# Patient Record
Sex: Female | Born: 1940
Health system: Southern US, Community
[De-identification: ages and names within clinical notes are randomized; demographics above are authoritative.]

## PROBLEM LIST (undated history)

## (undated) DIAGNOSIS — Z9109 Other allergy status, other than to drugs and biological substances: Secondary | ICD-10-CM

## (undated) DIAGNOSIS — E785 Hyperlipidemia, unspecified: Secondary | ICD-10-CM

## (undated) DIAGNOSIS — Z951 Presence of aortocoronary bypass graft: Secondary | ICD-10-CM

## (undated) DIAGNOSIS — R0600 Dyspnea, unspecified: Secondary | ICD-10-CM

## (undated) DIAGNOSIS — K219 Gastro-esophageal reflux disease without esophagitis: Secondary | ICD-10-CM

## (undated) DIAGNOSIS — S82002A Unspecified fracture of left patella, initial encounter for closed fracture: Secondary | ICD-10-CM

## (undated) DIAGNOSIS — I25119 Atherosclerotic heart disease of native coronary artery with unspecified angina pectoris: Secondary | ICD-10-CM

## (undated) HISTORY — DX: Hyperlipidemia, unspecified: E78.5

## (undated) HISTORY — PX: DILATION AND CURETTAGE, DIAGNOSTIC / THERAPEUTIC: SUR384

## (undated) HISTORY — DX: Other allergy status, other than to drugs and biological substances: Z91.09

## (undated) HISTORY — DX: Gastro-esophageal reflux disease without esophagitis: K21.9

## (undated) HISTORY — DX: Unspecified fracture of left patella, initial encounter for closed fracture: S82.002A

---

## 2003-08-22 DIAGNOSIS — S82002A Unspecified fracture of left patella, initial encounter for closed fracture: Secondary | ICD-10-CM

## 2003-08-22 HISTORY — DX: Unspecified fracture of left patella, initial encounter for closed fracture: S82.002A

## 2012-08-21 DIAGNOSIS — E785 Hyperlipidemia, unspecified: Secondary | ICD-10-CM

## 2012-08-21 HISTORY — DX: Hyperlipidemia, unspecified: E78.5

## 2018-01-15 ENCOUNTER — Ambulatory Visit (HOSPITAL_COMMUNITY)
Admission: EM | Admit: 2018-01-15 | Discharge: 2018-01-15 | Disposition: A | Discharge status: Home / Self Care | Payer: Medicare HMO | Attending: Emergency Medicine | Admitting: Emergency Medicine

## 2018-01-15 ENCOUNTER — Encounter (HOSPITAL_COMMUNITY): Payer: Self-pay | Admitting: Emergency Medicine

## 2018-01-15 ENCOUNTER — Other Ambulatory Visit: Payer: Self-pay

## 2018-01-15 DIAGNOSIS — W57XXXA Bitten or stung by nonvenomous insect and other nonvenomous arthropods, initial encounter: Secondary | ICD-10-CM | POA: Diagnosis not present

## 2018-01-15 DIAGNOSIS — S70362A Insect bite (nonvenomous), left thigh, initial encounter: Secondary | ICD-10-CM

## 2018-01-15 DIAGNOSIS — L0390 Cellulitis, unspecified: Secondary | ICD-10-CM | POA: Diagnosis not present

## 2018-01-15 MED ORDER — DOXYCYCLINE HYCLATE 100 MG PO CAPS: 100.0000 mg | ORAL_CAPSULE | Freq: Two times a day (BID) | ORAL | 0 refills | Status: DC

## 2018-01-15 NOTE — Discharge Instructions (Signed)
Will start on doxy for the cellulitis this is also a medication that is used to cover lyme's dx if needed. Start the medication and take the full dose. Use a probiotic or eat yogurt with medications.  If  the area seems to become worse pt will need to follow up with pcp for further labs. No labs are needed at this time.  Stay hydrated

## 2018-01-15 NOTE — ED Triage Notes (Signed)
The patient presented to the [facility] with a complaint of a possible tick bite to the back of her left thigh.

## 2018-01-15 NOTE — ED Provider Notes (Signed)
[facility]    CSN: [id number] Arrival date & time: 01/15/18  1201     History   Chief Complaint Chief Complaint  Patient presents with  . Insect Bite    HPI Jaime Chavez is a 77 y.o. female.   Pt states 1 week ago she was outside and came in approx 5 hours later and husband removed a tick to LT back of thigh. States that she does not have any n/v/d no fevers, has felt her baseline she is just concerned due to it is some redness around the area. Has tried alcohol and warm compress to the area with not much of a change.      History reviewed. No pertinent past medical history.  There are no active problems to display for this patient.   History reviewed. No pertinent surgical history.  OB History   None      Home Medications    Prior to Admission medications   Medication Sig Start Date End Date Taking? Authorizing Provider  doxycycline (VIBRAMYCIN) 100 MG capsule Take 1 capsule (100 mg total) by mouth 2 (two) times daily. 01/15/18   Coralyn Mark, NP    Family History History reviewed. No pertinent family history.  Social History Social History   Tobacco Use  . Smoking status: Never Smoker  . Smokeless tobacco: Never Used  Substance Use Topics  . Alcohol use: Yes    Comment: Occ  . Drug use: Never     Allergies   Patient has no known allergies.   Review of Systems Review of Systems  Constitutional: Negative.   Eyes: Negative.   Respiratory: Negative.   Cardiovascular: Negative.   Gastrointestinal: Negative.   Genitourinary: Negative.   Skin: Positive for rash.       Lt posterior thigh redness and tick bite.   Neurological: Negative.      Physical Exam Triage Vital Signs ED Triage Vitals  Enc Vitals Group     BP 01/15/18 1245 (!) 163/75     Pulse Rate 01/15/18 1245 63     Resp 01/15/18 1245 18     Temp 01/15/18 1245 97.6 F (36.4 C)     Temp Source 01/15/18 1245 Oral     SpO2 01/15/18 1245 99 %     Weight --    Height --      Head Circumference --      Peak Flow --      Pain Score 01/15/18 1242 0     Pain Loc --      Pain Edu? --      Excl. in GC? --    No data found.  Updated Vital Signs BP (!) 163/75 (BP Location: Right Arm)   Pulse 63   Temp 97.6 F (36.4 C) (Oral)   Resp 18   SpO2 99%   Visual Acuity     Physical Exam  Constitutional: She appears well-developed.  Eyes: Pupils are equal, round, and reactive to light.  Neck: Normal range of motion.  Cardiovascular: Normal rate and regular rhythm.  Pulmonary/Chest: Effort normal and breath sounds normal.  Abdominal: Soft. Bowel sounds are normal.  Musculoskeletal: Normal range of motion.  Neurological: She is alert.  Skin: Rash noted. There is erythema.  Lt upper thigh posterior dime size erythema no open wound, no drainage.      UC Treatments / Results  Labs (all labs ordered are listed, but only abnormal results are displayed) Labs Reviewed - No data to  display  EKG None  Radiology No results found.  Procedures Procedures (including critical care time)  Medications Ordered in UC Medications - No data to display  Initial Impression / Assessment and Plan / UC Course  I have reviewed the triage vital signs and the nursing notes.  Pertinent labs & imaging results that were available during my care of the patient were reviewed by me and considered in my medical decision making (see chart for details).     Spoke with MD nelson did not feel the need to complete lyme's labs and will tx with doxy for cellulites to the area.  Expressed to monitor area and if the area becomes larger to see pcp for further lab testing.   Final Clinical Impressions(s) / UC Diagnoses   Final diagnoses:  Insect bite of left thigh, initial encounter  Cellulitis, unspecified cellulitis site     Discharge Instructions     Will start on doxy for the cellulitis this is also a medication that is used to cover lyme's dx if needed. Start  the medication and take the full dose. Use a probiotic or eat yogurt with medications.  If  the area seems to become worse pt will need to follow up with pcp for further labs. No labs are needed at this time.  Stay hydrated     ED Prescriptions    Medication Sig Dispense Auth. Provider   doxycycline (VIBRAMYCIN) 100 MG capsule Take 1 capsule (100 mg total) by mouth 2 (two) times daily. 20 capsule Coralyn Mark, NP     Controlled Substance Prescriptions [place] Controlled Substance Registry consulted? Not Applicable   Coralyn Mark, NP 01/15/18 1358

## 2018-02-22 ENCOUNTER — Encounter: Payer: Self-pay | Admitting: Family Medicine

## 2018-02-22 DIAGNOSIS — E785 Hyperlipidemia, unspecified: Secondary | ICD-10-CM | POA: Insufficient documentation

## 2018-02-22 DIAGNOSIS — J302 Other seasonal allergic rhinitis: Secondary | ICD-10-CM | POA: Insufficient documentation

## 2018-02-22 DIAGNOSIS — K219 Gastro-esophageal reflux disease without esophagitis: Secondary | ICD-10-CM | POA: Insufficient documentation

## 2018-02-25 ENCOUNTER — Ambulatory Visit (INDEPENDENT_AMBULATORY_CARE_PROVIDER_SITE_OTHER): Payer: Medicare HMO | Admitting: Family Medicine

## 2018-02-25 ENCOUNTER — Encounter: Payer: Self-pay | Admitting: Family Medicine

## 2018-02-25 ENCOUNTER — Other Ambulatory Visit: Payer: Self-pay

## 2018-02-25 VITALS — BP 138/74 | HR 73 | Temp 98.5°F | Ht 62.5 in | Wt 162.0 lb

## 2018-02-25 DIAGNOSIS — E2839 Other primary ovarian failure: Secondary | ICD-10-CM | POA: Diagnosis not present

## 2018-02-25 DIAGNOSIS — R0602 Shortness of breath: Secondary | ICD-10-CM | POA: Diagnosis not present

## 2018-02-25 DIAGNOSIS — L821 Other seborrheic keratosis: Secondary | ICD-10-CM

## 2018-02-25 DIAGNOSIS — Z1231 Encounter for screening mammogram for malignant neoplasm of breast: Secondary | ICD-10-CM

## 2018-02-25 DIAGNOSIS — Z1239 Encounter for other screening for malignant neoplasm of breast: Secondary | ICD-10-CM

## 2018-02-25 DIAGNOSIS — J301 Allergic rhinitis due to pollen: Secondary | ICD-10-CM

## 2018-02-25 DIAGNOSIS — E663 Overweight: Secondary | ICD-10-CM

## 2018-02-25 DIAGNOSIS — T753XXA Motion sickness, initial encounter: Secondary | ICD-10-CM

## 2018-02-25 MED ORDER — SCOPOLAMINE 1 MG/3DAYS TD PT72: 1.0000 | MEDICATED_PATCH | TRANSDERMAL | 0 refills | Status: DC

## 2018-02-25 MED ORDER — FLUTICASONE PROPIONATE 50 MCG/ACT NA SUSP: 1.0000 | Freq: Every day | NASAL | 12 refills | Status: DC

## 2018-02-25 NOTE — Progress Notes (Signed)
Subjective:  Jaime Chavez is a 77 y.o. female who presents to the [facility] today to establish care and with multiple complaints.  Patient was previously seen by PCP and [facility].  She and her husband moved to [place] about 2 years ago and she wants to establish with a PCP  HPI:  Shortness of breath Has been having some increasing, slowly progressing, shortness of breath on exertion.  Most noticeable when she is going up an incline or when walking when hot outside.  This is been ongoing for the past few years.  Her husband notes that she gets very tired.  She has no wheezing.  She does have a chronic cough that she thinks is more due from her allergic rhinitis.  She has never smoked a day in her life.  She has no family history or personal history of asthma No chest pain, palpitations, lightheadedness, dizziness. She does have some heavy snoring at night and her husband notes that she frequently has episodes where she gasps for breath and will stop breathing for about 20 seconds while she sleeps She notes that she has some daytime sleepiness particularly in the afternoons.  She recalls being told that she had sleep apnea in the past but has never had a sleep study for this  Chronic right hip pain and lower back aches She states her right hip aches and has difficulty sleeping at night.  She takes Tylenol most nightly for sleep.  She has never had any imaging of this area.   Acid reflux She states that she takes ranitidine as needed for this as most recently this has been well controlled by her diet.  Allergic rhinitis Has had chronic runny nose.  She takes cetirizine which for the most part brings some relief.  However she continues to have clear rhinorrhea.    Seasickness She is going on a trip in September it is a 10-day cruise with her husband.  She has never been on any seasickness medications.  She recalls being seasick on a previous trip in the past.  Tick bite She  had a tick on the back of her leg in May she was seen for this at urgent care and was treated with doxycycline per chart review.  Itchy spot on her back  Health Maintenance Colonoscopy last was 2014, told to follow up in 5 years. Does not want to repeat a colonoscopy at this time because she felt like it really messed up her GI system. Mammogram 2014 Has never had a bone density scan States that her pneumonia shot was in 2013, she never had a second shot   ROS: per HPi, otherwise all systems reviewed and negative  PMH:  The following were reviewed and entered/updated in epic: Past Medical History:  Diagnosis Date  . Acid reflux   . Environmental allergies   . Hyperlipidemia 2014  . Left patella fracture 2005   Patient Active Problem List   Diagnosis Date Noted  . Seasonal allergies 02/22/2018  . Acid reflux 02/22/2018  . Hyperlipidemia 02/22/2018   Past Surgical History:  Procedure Laterality Date  . DILATION AND CURETTAGE, DIAGNOSTIC / THERAPEUTIC     for nonmalignant polyp    Family History  Problem Relation Age of Onset  . Pancreatic cancer Mother   . Heart attack Father        age 14, had 3 heart attacks from blood clots   . Other Father  clotting disorder  . Colon cancer Sister   . Breast cancer Maternal Aunt     Medications- reviewed and updated Current Outpatient Medications  Medication Sig Dispense Refill  . Cetirizine HCl (KLS ALLER-TEC PO) Take 10 mg by mouth daily.    . ranitidine (ZANTAC) 150 MG capsule Take 150 mg by mouth as needed.    . fluticasone (FLONASE) 50 MCG/ACT nasal spray Place 1 spray into both nostrils daily. 1 spray in each nostril every day 16 g 12  . scopolamine (TRANSDERM-SCOP) 1 MG/3DAYS Place 1 patch (1.5 mg total) onto the skin every 3 (three) days. 4 patch 0   No current facility-administered medications for this visit.     Allergies-reviewed and updated No Known Allergies  Social History   Socioeconomic History  .  Marital status: Married    Spouse name: Not on file  . Number of children: Not on file  . Years of education: high school graduate  . Highest education level: Not on file  Occupational History  . Occupation: retired  Engineer, production  . Financial resource strain: Not on file  . Food insecurity:    Worry: Not on file    Inability: Not on file  . Transportation needs:    Medical: Not on file    Non-medical: Not on file  Tobacco Use  . Smoking status: Never Smoker  . Smokeless tobacco: Never Used  Substance and Sexual Activity  . Alcohol use: Yes    Comment: wine and liquor, about 1 drink per day  . Drug use: Never  . Sexual activity: Not on file  Lifestyle  . Physical activity:    Days per week: Not on file    Minutes per session: Not on file  . Stress: Not on file  Relationships  . Social connections:    Talks on phone: Not on file    Gets together: Not on file    Attends religious service: Not on file    Active member of club or organization: Not on file    Attends meetings of clubs or organizations: Not on file    Relationship status: Not on file  Other Topics Concern  . Not on file  Social History Narrative   Lives with husband Reggie and dog named Hachi.   Religious or personal believes: [employer]   Has an advance directive, would want her husband to make medical decisions for her if she were unable to do so.   Does not exercise regularly.  Does walk sometimes.   For fun she likes to eat out, travel, spent time with family.    Objective:  Physical Exam: BP 138/74   Pulse 73   Temp 98.5 F (36.9 C) (Oral)   Ht 5' 2.5" (1.588 m)   Wt 162 lb (73.5 kg)   SpO2 96%   BMI 29.16 kg/m   Gen: NAD, resting comfortably CV: RRR with no murmurs appreciated Pulm: NWOB, CTAB with no crackles, wheezes, or rhonchi GI: Normal bowel sounds present. Soft, Nontender, Nondistended. MSK: no edema, cyanosis, or clubbing noted Skin: warm, dry.  Her upper back has a 1 cm greasy  stuck on brown patch most consistent with a seborrheic keratosis.  She has a small brown raised circular papule on the back of her left leg that is nonerythematous and nontender to palpation Neuro: grossly normal, moves all extremities Psych: Normal affect and thought content   Assessment/Plan:  1. Shortness of breath Uncertain etiology.  Given chronic chronology and stable  vitals with reassuring lung exam today without wheezing, no need for prn albuterol at this time.  Will get PFTs and refer to sleep medicine to evaluate for sleep apnea. - Ambulatory referral to Sleep Studies  2. Overweight (BMI 25.0-29.9) We will get baseline blood work and request records from PCP - CBC - Basic metabolic panel - TSH - Lipid panel  3. Seasonal allergic rhinitis  Continue cetirizine and add Flonase - fluticasone (FLONASE) 50 MCG/ACT nasal spray; Place 1 spray into both nostrils daily. 1 spray in each nostril every day  Dispense: 16 g; Refill: 12  4. Breast cancer screening - MM Digital Screening; Future  5. Estrogen deficiency - DG Bone Density; Future  6. Seborrheic keratosis Patient reassured  7. Seasickness Given Rx for scopolamine patch to use on her trip.  She is to follow-up at her convenience to discuss her various other concerns   Leland HerElsia J Kazim Corrales, DO PGY-3, [facility] 02/25/2018 3:41 PM

## 2018-02-25 NOTE — Patient Instructions (Signed)
It was good to see you today!  For your allergic rhinitis - Keep taking cetirizine Aller-tec at 10mg  a day  - Add flonase 1 spray in each nostril, can go up to 2 sprays if needed, remember to point it 45 degrees  Make an appointment with our pharmacy clinic for PFTs. I have placed a referral to sleep medicine, let us know if no one has contacted you about this in 2 weeks.   We are checking some labs today. If results require attention, either myself or my nurse will get in touch with you. If everything is normal, you will get a letter in the mail or a message in My Chart. Please give us a call if you do not hear from us after 2 weeks.  Please check-out at the front desk before leaving the clinic. Make an appointment whenever you like to talk about your other concerns.  Please bring all of your medications with you to each visit.   Sign up for My Chart to have easy access to your labs results, and communication with your primary care physician.  Feel free to call with any questions or concerns at any time, at [phone].   Take care,  Dr. Leland HerElsia J Haylyn Halberg, DO [facility]

## 2018-02-26 ENCOUNTER — Other Ambulatory Visit: Payer: Self-pay | Admitting: Family Medicine

## 2018-02-26 DIAGNOSIS — E663 Overweight: Secondary | ICD-10-CM

## 2018-02-26 LAB — LIPID PANEL
CHOL/HDL RATIO: 5.4 ratio — AB (ref 0.0–4.4)
Cholesterol, Total: 316 mg/dL — ABNORMAL HIGH (ref 100–199)
HDL: 58 mg/dL (ref >39)
LDL CALC: 203 mg/dL — AB (ref 0–99)
Triglycerides: 277 mg/dL — ABNORMAL HIGH (ref 0–149)
VLDL CHOLESTEROL CAL: 55 mg/dL — AB (ref 5–40)

## 2018-02-26 LAB — CBC
HEMATOCRIT: 43.7 % (ref 34.0–46.6)
HEMOGLOBIN: 14.8 g/dL (ref 11.1–15.9)
MCH: 29.9 pg (ref 26.6–33.0)
MCHC: 33.9 g/dL (ref 31.5–35.7)
MCV: 88 fL (ref 79–97)
Platelets: 275 10*3/uL (ref 150–450)
RBC: 4.95 x10E6/uL (ref 3.77–5.28)
RDW: 14.9 % (ref 12.3–15.4)
WBC: 7.3 10*3/uL (ref 3.4–10.8)

## 2018-02-26 LAB — TSH: TSH: 4.27 u[IU]/mL (ref 0.450–4.500)

## 2018-02-26 LAB — BASIC METABOLIC PANEL
BUN / CREAT RATIO: 15 (ref 12–28)
BUN: 11 mg/dL (ref 8–27)
CO2: 24 mmol/L (ref 20–29)
CREATININE: 0.71 mg/dL (ref 0.57–1.00)
Calcium: 9.8 mg/dL (ref 8.7–10.3)
Chloride: 101 mmol/L (ref 96–106)
GFR calc Af Amer: 97 mL/min/{1.73_m2} (ref >59)
GFR, EST NON AFRICAN AMERICAN: 84 mL/min/{1.73_m2} (ref >59)
Glucose: 92 mg/dL (ref 65–99)
Potassium: 5.1 mmol/L (ref 3.5–5.2)
SODIUM: 141 mmol/L (ref 134–144)

## 2018-02-28 ENCOUNTER — Other Ambulatory Visit: Payer: Medicare HMO

## 2018-02-28 DIAGNOSIS — E663 Overweight: Secondary | ICD-10-CM

## 2018-03-01 ENCOUNTER — Other Ambulatory Visit: Payer: Self-pay | Admitting: Family Medicine

## 2018-03-01 ENCOUNTER — Encounter: Payer: Self-pay | Admitting: Family Medicine

## 2018-03-01 DIAGNOSIS — E785 Hyperlipidemia, unspecified: Secondary | ICD-10-CM

## 2018-03-01 LAB — LIPID PANEL
CHOLESTEROL TOTAL: 314 mg/dL — AB (ref 100–199)
Chol/HDL Ratio: 5.8 ratio — ABNORMAL HIGH (ref 0.0–4.4)
HDL: 54 mg/dL (ref >39)
LDL Calculated: 208 mg/dL — ABNORMAL HIGH (ref 0–99)
TRIGLYCERIDES: 262 mg/dL — AB (ref 0–149)
VLDL CHOLESTEROL CAL: 52 mg/dL — AB (ref 5–40)

## 2018-03-01 MED ORDER — ROSUVASTATIN CALCIUM 20 MG PO TABS: 20.0000 mg | ORAL_TABLET | Freq: Every day | ORAL | 3 refills | Status: DC

## 2018-03-01 NOTE — Progress Notes (Signed)
Error

## 2018-03-11 ENCOUNTER — Encounter: Payer: Self-pay | Admitting: Family Medicine

## 2018-04-18 ENCOUNTER — Ambulatory Visit
Admission: RE | Admit: 2018-04-18 | Discharge: 2018-04-18 | Disposition: A | Discharge status: Home / Self Care | Payer: Medicare HMO | Source: Ambulatory Visit | Attending: Family Medicine | Admitting: Family Medicine

## 2018-04-18 DIAGNOSIS — M8588 Other specified disorders of bone density and structure, other site: Secondary | ICD-10-CM | POA: Diagnosis not present

## 2018-04-18 DIAGNOSIS — Z1239 Encounter for other screening for malignant neoplasm of breast: Secondary | ICD-10-CM

## 2018-04-18 DIAGNOSIS — M810 Age-related osteoporosis without current pathological fracture: Secondary | ICD-10-CM | POA: Diagnosis not present

## 2018-04-18 DIAGNOSIS — Z1231 Encounter for screening mammogram for malignant neoplasm of breast: Secondary | ICD-10-CM | POA: Diagnosis not present

## 2018-04-18 DIAGNOSIS — Z780 Asymptomatic menopausal state: Secondary | ICD-10-CM | POA: Diagnosis not present

## 2018-04-18 DIAGNOSIS — E2839 Other primary ovarian failure: Secondary | ICD-10-CM

## 2018-04-24 ENCOUNTER — Telehealth: Payer: Self-pay | Admitting: Family Medicine

## 2018-04-24 NOTE — Telephone Encounter (Signed)
Call patient to discuss her bone density results.  She is osteoporotic.  She is very hesitant to start any medications for this as she has heard from her friends that they have side effects.  She does not have any specific fears.  She was recommended to start calcium 1200 mg daily and vitamin D 800 international units daily.  To check her vitamin D levels at her next visit.  Commended daily weightbearing exercise however she is still having some shortness of breath on exertion.  She has not made an appointment for PFTs.  She has not heard back regarding referral to sleep medicine. Advised patient to call back at her convenience to schedule PFTs to her pharmacy clinic, she voiced good understanding Per chart review, referral to sleep medicine was placed in [facility] work you.  Will reach out to referral coordinator regarding status. This patient following up with me at her convenience.  She is undergoing some crown revision dental surgery tomorrow so she anticipates that this visit will not be in the near future.

## 2018-04-30 ENCOUNTER — Other Ambulatory Visit: Payer: Self-pay | Admitting: Family Medicine

## 2018-04-30 DIAGNOSIS — R928 Other abnormal and inconclusive findings on diagnostic imaging of breast: Secondary | ICD-10-CM

## 2018-05-06 ENCOUNTER — Other Ambulatory Visit: Payer: Medicare HMO

## 2018-05-09 ENCOUNTER — Encounter: Payer: Self-pay | Admitting: Pharmacist

## 2018-05-09 ENCOUNTER — Ambulatory Visit (HOSPITAL_COMMUNITY)
Admission: RE | Admit: 2018-05-09 | Discharge: 2018-05-09 | Disposition: A | Discharge status: Home / Self Care | Payer: Medicare HMO | Source: Ambulatory Visit | Attending: Family Medicine | Admitting: Family Medicine

## 2018-05-09 ENCOUNTER — Ambulatory Visit: Payer: Medicare HMO | Admitting: Pharmacist

## 2018-05-09 ENCOUNTER — Other Ambulatory Visit: Payer: Self-pay

## 2018-05-09 ENCOUNTER — Telehealth: Payer: Self-pay

## 2018-05-09 ENCOUNTER — Ambulatory Visit (INDEPENDENT_AMBULATORY_CARE_PROVIDER_SITE_OTHER): Payer: Medicare HMO | Admitting: Family Medicine

## 2018-05-09 VITALS — BP 144/82 | HR 91 | Ht 64.0 in | Wt 163.0 lb

## 2018-05-09 VITALS — BP 144/82 | HR 91 | Ht 64.0 in | Wt 163.8 lb

## 2018-05-09 DIAGNOSIS — I209 Angina pectoris, unspecified: Secondary | ICD-10-CM

## 2018-05-09 DIAGNOSIS — E785 Hyperlipidemia, unspecified: Secondary | ICD-10-CM

## 2018-05-09 DIAGNOSIS — R079 Chest pain, unspecified: Secondary | ICD-10-CM

## 2018-05-09 DIAGNOSIS — R0602 Shortness of breath: Secondary | ICD-10-CM

## 2018-05-09 DIAGNOSIS — R0789 Other chest pain: Secondary | ICD-10-CM | POA: Insufficient documentation

## 2018-05-09 MED ORDER — ASPIRIN EC 81 MG PO TBEC: 81.0000 mg | DELAYED_RELEASE_TABLET | Freq: Every day | ORAL | 2 refills | Status: AC

## 2018-05-09 MED ORDER — NITROGLYCERIN 0.4 MG SL SUBL: 0.4000 mg | SUBLINGUAL_TABLET | SUBLINGUAL | 3 refills | Status: DC | PRN

## 2018-05-09 NOTE — Assessment & Plan Note (Signed)
Chest Pain/ Hypercholestrolemia -Discussed with Dr. Gwendolyn GrantWalden. Referred to same-day visit with Family Medicine provider, Dr. Homero FellersFrank,  for EKG evaluation and further workup.  Encourage and reeducated on need to restart daily rosuvastatin.

## 2018-05-09 NOTE — Assessment & Plan Note (Addendum)
Though [name] does not have a history of heart problems, this pain is highly suspicious for stable angina.  ECG shows no STEMI. Pt appears comfortable and without pain in the office.  Pt was told to go immediately to the ED if she experiences this pain at rest or if she notices significant progression of the pain with exertion. -Referral to cardiology, urgent -ASA 81mg  -nitroglycerin SL

## 2018-05-09 NOTE — Patient Instructions (Signed)
Angina Pectoris Angina pectoris is a very bad feeling in the chest, neck, or arm. Your doctor may call it angina. There are four types of angina. Angina is caused by a lack of blood in the middle and thickest layer of the heart wall (myocardium). Angina may feel like a crushing or squeezing pain in the chest. It may feel like tightness or heavy pressure in the chest. Some people say it feels like gas, heartburn, or indigestion. Some people have symptoms other than pain. These include:  Shortness of breath.  Cold sweats.  Feeling sick to your stomach (nausea).  Feeling light-headed.  Many women have chest discomfort and some of the other symptoms. However, women often have different symptoms, such as:  Feeling tired (fatigue).  Feeling nervous for no reason.  Feeling weak for no reason.  Dizziness or fainting.  Women may have angina without any symptoms. Follow these instructions at home:  Take medicines only as told by your doctor.  Take care of other health issues as told by your doctor. These include: ? High blood pressure (hypertension). ? Diabetes.  Follow a heart-healthy diet. Your doctor can help you to choose healthy food options and make changes.  Talk to your doctor to learn more about healthy cooking methods and use them. These include: ? Roasting. ? Grilling. ? Broiling. ? Baking. ? Poaching. ? Steaming. ? Stir-frying.  Follow an exercise program approved by your doctor.  Keep a healthy weight. Lose weight as told by your doctor.  Rest when you are tired.  Learn to manage stress.  Do not use any tobacco, such as cigarettes, chewing tobacco, or electronic cigarettes. If you need help quitting, ask your doctor.  If you drink alcohol, and your doctor says it is okay, limit yourself to no more than 1 drink per day. One drink equals 12 ounces of beer, 5 ounces of wine, or 1 ounces of hard liquor.  Stop illegal drug use.  Keep all follow-up visits as told  by your doctor. This is important. Do not take these medicines unless your doctor says that you can:  Nonsteroidal anti-inflammatory drugs (NSAIDs). These include: ? Ibuprofen. ? Naproxen. ? Celecoxib.  Vitamin supplements that have vitamin A, vitamin E, or both.  Hormone therapy that contains estrogen with or without progestin.  Get help right away if:  You have pain in your chest, neck, arm, jaw, stomach, or back that: ? Lasts more than a few minutes. ? Comes back. ? Does not get better after you take medicine under your tongue (sublingual nitroglycerin).  You have any of these symptoms for no reason: ? Gas, heartburn, or indigestion. ? Sweating a lot. ? Shortness of breath or trouble breathing. ? Feeling sick to your stomach or throwing up. ? Feeling more tired than usual. ? Feeling nervous or worrying more than usual. ? Feeling weak. ? Diarrhea.  You are suddenly dizzy or light-headed.  You faint or pass out. These symptoms may be an emergency. Do not wait to see if the symptoms will go away. Get medical help right away. Call your local emergency services (911 in the [place] Do not drive yourself to the hospital. This information is not intended to replace advice given to you by your health care provider. Make sure you discuss any questions you have with your health care provider. Document Released: 01/24/2008 Document Revised: 01/13/2016 Document Reviewed: 12/09/2013 Elsevier Interactive Patient Education  2017 Elsevier Inc.  

## 2018-05-09 NOTE — Progress Notes (Signed)
   S:    Patient arrives in good spirits, ambulating without assistance, with her husband Reggie. Presents for lung function evaluation.  Patient was referred at last visit with PCP, Dr. Artist PaisYoo, on 02/25/2018.   Patient reports breathing has been pain with breathing for a couple of months. She is unsure how long SOB upon exertion has been going on. Reports allergies have been better since moving to [place] from [place]; but reports nasal symptoms of allergies, with a cough. Patient denies atopic sx consistent with allergies.  Patient has never smoked in her life, and reports working in multiple places but not places where she was exposed to significant smoke or pollutants. She believes she may have lived in a house with asbestos before it was remodeled.  She reports having SOB w/ exertion which has impacted her ability to exercise. She also has SOB while taking deep breaths, even when sitting and resting. This has been occurring for several years. Around 3 months ago, she also noticed that when she walks up inclines she also has central chest pain that radiates to left arm. She reports that it is brought upon by exertion and humidity makes it worse.  As described in Dr. Olegario MessierYoo's note from 02/25/2018, husband reports apnic episodes overnight. She has not been seen for a sleep study yet, though the referral has been made. She does report waking up at night, but not due to being out of breath.  O: Physical Exam  Constitutional: She appears well-developed and well-nourished.  Vitals reviewed.  Review of Systems  All other systems reviewed and are negative.  Vitals:   05/09/18 0930  BP: (!) 144/82  Pulse: 91  SpO2: 96%   See "scanned report" or Documentation Flowsheet (discrete results - PFTs) for  Spirometry results. Patient provided good effort while attempting spirometry.   Lung Age = 89 Albuterol Neb  Lot# [id number]    Exp. 4/21  Lipid Panel     Component Value Date/Time   CHOL 314 (H) 02/28/2018  0840   TRIG 262 (H) 02/28/2018 0840   HDL 54 02/28/2018 0840   CHOLHDL 5.8 (H) 02/28/2018 0840   LDLCALC 208 (H) 02/28/2018 0840     A/P: SOB upon exertion for several years in a patient without any respiratory medication. Spirometry evaluation reveals Mild restrictive lung disease, with no reversibility following albuterol neb treatment. Due to reports of chest pain as well as idiopathic nature of reduced lung function, further workup is needed. -Reviewed results of pulmonary function tests with patient and husband.  -Referred to PCP Dr. Artist PaisYoo for further analysis of breathing after sleep study.  Chest Pain/ Hypercholestrolemia -Discussed with Dr. Gwendolyn GrantWalden. Referred to same-day visit with Family Medicine provider, Dr. Homero FellersFrank,  for EKG evaluation and further workup.  Encourage and reeducated on need to restart daily rosuvastatin.   Patient verbalized understanding of results and education.  Written pt instructions provided.   F/U with PCP after sleep study.   Total time in face to face counseling 20 minutes.  Patient seen with Caffie PintoAkshara Kumar, PharmD Candidate and Catie Feliz Beamravis, PharmD,  PGY2 Pharmacy Resident.  .Marland Kitchen

## 2018-05-09 NOTE — Patient Instructions (Addendum)
It was wonderful to meet you today! 1. Thank you for trying so hard on the test! Your lung function was found to be abnormal, but we will not start any medications today without further work-up with your doctor.  2. Follow up after sleep study with Dr. Artist PaisYoo to be further evaluated for your breathing.

## 2018-05-09 NOTE — Progress Notes (Signed)
    Subjective:  Jaime Chavez is a 77 y.o. female who presents to the [facility] today with a chief complaint of chest pain.   HPI:  Problem  New-Onset Angina (Hcc)   [name] has no history of cardiac issues.  She was seen in clinic today for a PFT to follow up regarding shortness of breath that has been troubling her for several years.  During her evaluation, she mentioned that she had been experiencing chest pain with exercise for the past three months and was scheduled for a same-day visit.   [name] describes the chest pain as a burning or pressure that starts in the center of her chest and radiates to her left arm and beneath her breasts. She denies radiation to her back or jaw.  The pain usually occurs with exertion although she did have one episode of pain while seated.  Typically, the pain comes on with exercise and is alleviated with cessation of activity. Since the onset of the pain with exertion, it has not changed significantly.  She has a history of GERD and is currently taking ranitidine, she notes that this chest pain seems different from her GERD pain.      Objective:  Physical Exam: BP (!) 144/82   Pulse 91   Ht 5\' 4"  (1.626 m)   Wt 163 lb (73.9 kg)   SpO2 96%   BMI 27.98 kg/m   Gen: NAD, resting comfortably HEENT: no JVD CV: RRR with no murmurs appreciated Pulm: rales bilaterally in lower fields, no wheezing Extremities: no LE edema  ECG: Without ST elevation, T-wave inversion noted in V1 and V2,   No results found for this or any previous visit (from the past 72 hour(s)).   Assessment/Plan:  New-onset angina [facility](HCC) Though [name] does not have a history of heart problems, this pain is highly suspicious for stable angina.  ECG shows no STEMI. Pt appears comfortable and without pain in the office.  Pt was told to go immediately to the ED if she experiences this pain at rest or if she notices significant progression of the pain with exertion. -Referral to cardiology,  urgent -ASA 81mg  -nitroglycerin SL

## 2018-05-09 NOTE — Telephone Encounter (Signed)
Pt LVM on nurse line. Pt wants to let Dr. Artist PaisYoo know that she is willing to take the fosamax if Dr. Artist PaisYoo will send the Rx to the pharmacy. Sunday SpillersSharon T Lennex Pietila, CMA d

## 2018-05-09 NOTE — Assessment & Plan Note (Signed)
SOB upon exertion for several years in a patient without any respiratory medication. Spirometry evaluation reveals Mild restrictive lung disease, with no reversibility following albuterol neb treatment. Due to reports of chest pain as well as idiopathic nature of reduced lung function, further workup is needed. -Reviewed results of pulmonary function tests with patient and husband.  -Referred to PCP Dr. Artist PaisYoo for further analysis of breathing after sleep study.

## 2018-05-10 LAB — PULMONARY FUNCTION TEST

## 2018-05-10 MED ORDER — ALENDRONATE SODIUM 70 MG PO TABS: 70.0000 mg | ORAL_TABLET | ORAL | 11 refills | Status: DC

## 2018-05-10 NOTE — Addendum Note (Signed)
Addended by: Leland HerYOO, Tanijah Morais J on: 05/10/2018 11:54 AM   Modules accepted: Orders

## 2018-05-13 ENCOUNTER — Telehealth: Payer: Self-pay

## 2018-05-13 NOTE — Telephone Encounter (Signed)
Will forward to referral coordinator to update patient on status.  Jazmin Hartsell,CMA

## 2018-05-13 NOTE — Telephone Encounter (Signed)
Patient left message to check on status of sleep study and cardiology referral. Ples SpecterAlisa Gwendola Hornaday, RN [facility] RN) '

## 2018-05-14 ENCOUNTER — Encounter (HOSPITAL_COMMUNITY): Payer: Self-pay | Admitting: Family Medicine

## 2018-05-14 ENCOUNTER — Ambulatory Visit (HOSPITAL_COMMUNITY)
Admission: EM | Admit: 2018-05-14 | Discharge: 2018-05-14 | Disposition: A | Discharge status: Home / Self Care | Payer: Medicare HMO | Attending: Family Medicine | Admitting: Family Medicine

## 2018-05-14 DIAGNOSIS — M545 Low back pain, unspecified: Secondary | ICD-10-CM

## 2018-05-14 MED ORDER — HYDROCODONE-ACETAMINOPHEN 5-325 MG PO TABS: 1.0000 | ORAL_TABLET | Freq: Four times a day (QID) | ORAL | 0 refills | Status: DC | PRN

## 2018-05-14 NOTE — ED Triage Notes (Signed)
Pt here for back pain; pt sts started new meds that could cause muscle pain

## 2018-05-14 NOTE — ED Provider Notes (Signed)
[facility]    CSN: [id number] Arrival date & time: 05/14/18  1225     History   Chief Complaint Chief Complaint  Patient presents with  . Back Pain    HPI Jaime Chavez is a 77 y.o. female.   This is a 77 year old woman who has a history of osteoporosis and was put on Fosamax.  She started her first dose yesterday and about 6 hours afterwards started developing excruciating back pain from her lower thoracic area all the way down to her hips.  The pain is also in the lower ribs and flanks.  She is having back spasms as well.  Patient's had no trauma, no fever, no urinary symptoms, no abdominal pain.  She has never had pain like this before.     Past Medical History:  Diagnosis Date  . Acid reflux   . Environmental allergies   . Hyperlipidemia 2014  . Left patella fracture 2005    Patient Active Problem List   Diagnosis Date Noted  . Shortness of breath 05/09/2018  . New-onset angina (HCC) 05/09/2018  . Seborrheic keratosis 02/25/2018  . Seasonal allergies 02/22/2018  . Acid reflux 02/22/2018  . Hyperlipidemia 02/22/2018    Past Surgical History:  Procedure Laterality Date  . DILATION AND CURETTAGE, DIAGNOSTIC / THERAPEUTIC     for nonmalignant polyp    OB History   None      Home Medications    Prior to Admission medications   Medication Sig Start Date End Date Taking? Authorizing Provider  acetaminophen (TYLENOL) 325 MG tablet Take 650 mg by mouth every 6 (six) hours as needed.    [provider]  alendronate (FOSAMAX) 70 MG tablet Take 1 tablet (70 mg total) by mouth every 7 (seven) days. Take with a full glass of water on an empty stomach. 05/10/18   Leland HerYoo, Elsia J, DO  aspirin EC 81 MG tablet Take 1 tablet (81 mg total) by mouth daily. 05/09/18   Mirian MoFrank, Peter, MD  Calcium Citrate-Vitamin D (CITRACAL MAXIMUM PO) Take 1 tablet by mouth.    [provider]  Cetirizine HCl (KLS ALLER-TEC PO) Take 10 mg by mouth daily.    [provider]  Coenzyme Q10 Liposomal 100 MG/ML LIQD Take 10 mLs by mouth.    [provider]  fluticasone (FLONASE) 50 MCG/ACT nasal spray Place 1 spray into both nostrils daily. 1 spray in each nostril every day 02/25/18   Leland HerYoo, Elsia J, DO  nitroGLYCERIN (NITROSTAT) 0.4 MG SL tablet Place 1 tablet (0.4 mg total) under the tongue every 5 (five) minutes as needed for chest pain. 05/09/18   Mirian MoFrank, Peter, MD  ranitidine (ZANTAC) 150 MG capsule Take 150 mg by mouth as needed.    [provider]  rosuvastatin (CRESTOR) 20 MG tablet Take 1 tablet (20 mg total) by mouth daily. Patient not taking: Reported on 05/09/2018 03/01/18   Leland HerYoo, Elsia J, DO  scopolamine (TRANSDERM-SCOP) 1 MG/3DAYS Place 1 patch (1.5 mg total) onto the skin every 3 (three) days. Patient not taking: Reported on 05/09/2018 02/25/18   Leland HerYoo, Elsia J, DO  TURMERIC PO Take 1,000 mg by mouth.    [provider]  vitamin B-12 (CYANOCOBALAMIN) 1000 MCG tablet Take 1,000 mcg by mouth daily.    [provider]    Family History Family History  Problem Relation Age of Onset  . Pancreatic cancer Mother   . Heart attack Father  age 65, had 3 heart attacks from blood clots   . Other Father        clotting disorder  . Colon cancer Sister   . Breast cancer Paternal Aunt     Social History Social History   Tobacco Use  . Smoking status: Never Smoker  . Smokeless tobacco: Never Used  Substance Use Topics  . Alcohol use: Yes    Comment: wine and liquor, about 1 drink per day  . Drug use: Never     Allergies   Patient has no known allergies.   Review of Systems Review of Systems  Constitutional: Negative.   Musculoskeletal: Positive for back pain. Negative for neck pain and neck stiffness.  Neurological: Negative.   All other systems reviewed and are negative.    Physical Exam Triage Vital Signs ED Triage Vitals [05/14/18 1344]  Enc Vitals Group     BP (!) 155/78     Pulse Rate 69       Resp 18     Temp 98.2 F (36.8 C)     Temp Source Oral     SpO2 98 %     Weight      Height      Head Circumference      Peak Flow      Pain Score      Pain Loc      Pain Edu?      Excl. in GC?    No data found.  Updated Vital Signs BP (!) 155/78 (BP Location: Left Arm)   Pulse 69   Temp 98.2 F (36.8 C) (Oral)   Resp 18   SpO2 98%    Physical Exam  Constitutional: She is oriented to person, place, and time. She appears well-developed and well-nourished.  HENT:  Right Ear: External ear normal.  Left Ear: External ear normal.  Mouth/Throat: Oropharynx is clear and moist.  Eyes: Pupils are equal, round, and reactive to light. Conjunctivae are normal.  Neck: Normal range of motion. Neck supple.  Pulmonary/Chest: Effort normal and breath sounds normal.  Abdominal: Soft. There is no tenderness.  Musculoskeletal: She exhibits no deformity.  Patient moves slowly getting out of her wheelchair she was using.  She is having intermittent spasms of her back muscles.  There is no palpable tenderness in her back.  Straight leg raising is negative  Neurological: She is alert and oriented to person, place, and time. No cranial nerve deficit or sensory deficit. She exhibits normal muscle tone.  Skin: Skin is warm and dry.  Nursing note reviewed.    UC Treatments / Results  Labs (all labs ordered are listed, but only abnormal results are displayed) Labs Reviewed - No data to display  EKG None  Radiology No results found.  Procedures Procedures (including critical care time)  Medications Ordered in UC Medications - No data to display  Initial Impression / Assessment and Plan / UC Course  I have reviewed the triage vital signs and the nursing notes.  Pertinent labs & imaging results that were available during my care of the patient were reviewed by me and considered in my medical decision making (see chart for details).    Final Clinical Impressions(s) / UC  Diagnoses   Final diagnoses:  None   Discharge Instructions   None    ED Prescriptions    None     Controlled Substance Prescriptions [place] Controlled Substance Registry consulted? Not Applicable   Elvina Sidle, MD 05/14/18 1402

## 2018-05-14 NOTE — Discharge Instructions (Signed)
I strongly suspect this is an adverse reaction to the alendronate.  Because the alendronate has such a long half-life, he may have pain for up to week.  It should be decreasing, however, and you should be able to go on your cruise on Thursday.  If your pain is worsening or not getting better, you will need imaging.  This is best done in the emergency department.  Clearly, if you develop new symptoms of weakness or difficulty going to the bathroom, or fever, further evaluation will be necessary urgently.

## 2018-05-31 ENCOUNTER — Encounter: Payer: Self-pay | Admitting: Neurology

## 2018-05-31 ENCOUNTER — Ambulatory Visit (INDEPENDENT_AMBULATORY_CARE_PROVIDER_SITE_OTHER): Payer: Medicare HMO | Admitting: Neurology

## 2018-05-31 VITALS — BP 146/80 | HR 71 | Ht 63.0 in | Wt 162.0 lb

## 2018-05-31 DIAGNOSIS — R0683 Snoring: Secondary | ICD-10-CM

## 2018-05-31 DIAGNOSIS — R0602 Shortness of breath: Secondary | ICD-10-CM

## 2018-05-31 DIAGNOSIS — K219 Gastro-esophageal reflux disease without esophagitis: Secondary | ICD-10-CM | POA: Diagnosis not present

## 2018-05-31 DIAGNOSIS — G478 Other sleep disorders: Secondary | ICD-10-CM | POA: Diagnosis not present

## 2018-05-31 DIAGNOSIS — G479 Sleep disorder, unspecified: Secondary | ICD-10-CM

## 2018-05-31 DIAGNOSIS — I209 Angina pectoris, unspecified: Secondary | ICD-10-CM

## 2018-05-31 NOTE — Progress Notes (Signed)
SLEEP MEDICINE CLINIC   Provider:  Melvyn Novas, M D  Primary Care Physician:  Leland Her, DO   Referring Provider: Leland Her, DO    Chief Complaint  Patient presents with  . New Patient (Initial Visit)    Sleep consult. Patient is accomplanied by her husband. Rm 10.    HPI:  Jaime Chavez is a 77 y.o. female patient seen here on 05-31-2018 in a referral from Dr. Artist Pais for a sleep consultation.   I have the pleasure meeting Jaime Chavez, a new patient to our practice who is seen here today with her husband.  Mr. Boylan has witnessed snoring, which may have become louder over the years but he also has noticed apneas and was actually able to time.  A lot of these appear to be prolonged over 20 seconds.  Mrs. he and her husband has moved have moved to [place] from [place].  In the last 6 months she has developed difficulties breathing and chest pain.  Her primary care physician has also referred her to cardiology but wanted to start with a sleep study after hearing about the respiratory difficulties.   Patient is currently taking a baby aspirin daily, rosuvastatin, nitroglycerin as needed, fluticasone nasal spray, Zantac and Tylenol.  She drinks alcohol perhaps 1-4 beverages weekly, she drinks 2 cups of coffee a day but rare ice tea - no other forms of caffeine endorsed.  Sleep habits are as follows: dinner time is from 5 -6 pm. Watching TV while eating and after - SOB keeps her form her usual after dinner walk. Bedtime is at 10, is cool,  Quiet but not all dark. A lot of electronics are emitting blue light. She reads in a book.  She is asleep within 30 minutes. She uses 1-2 pillows, and she raises her legs and head of bed 15 degrees, for indigestion - as an adjustable bed.  She sleeps on her side.  Wakes up but not because of the urge to urinate, she will go 1-3 times.  Wakes up spontaneously , between 4-6 AM. She reads in bed, relaxes and rises at 7 AM with her husband.  breakfast is at 7 with coffee 1-2 cups.  No naps   Sleep medical history and family sleep history:    Social history: Married, adult children, grandchildren. Grew up in [place], [place], and in [place].  One adult child, son - moves to [place] to follow him. Never been a smoker,  Alcohol 3-4 glasses a week, some cafe crema.  No history of night shift work   Review of Systems: Out of a complete 14 system review, the patient complains of only the following symptoms, and all other reviewed systems are negative. SOB, chest tightness, ankle edema.  couging for 2 years - dry , but recently productive, post nasal drip.  Epworth Sleepiness Score 6/ 24  , Fatigue severity score 58/ 63 !  , depression score 2/ 15   Social History   Socioeconomic History  . Marital status: Married    Spouse name: Not on file  . Number of children: Not on file  . Years of education: high school graduate  . Highest education level: Not on file  Occupational History  . Occupation: retired  Engineer, production  . Financial resource strain: Not on file  . Food insecurity:    Worry: Not on file    Inability: Not on file  . Transportation needs:    Medical: Not  on file    Non-medical: Not on file  Tobacco Use  . Smoking status: Never Smoker  . Smokeless tobacco: Never Used  Substance and Sexual Activity  . Alcohol use: Yes    Comment: wine and liquor, about 1 drink per day  . Drug use: Never  . Sexual activity: Not on file  Lifestyle  . Physical activity:    Days per week: Not on file    Minutes per session: Not on file  . Stress: Not on file  Relationships  . Social connections:    Talks on phone: Not on file    Gets together: Not on file    Attends religious service: Not on file    Active member of club or organization: Not on file    Attends meetings of clubs or organizations: Not on file    Relationship status: Not on file  . Intimate partner violence:    Fear of current or ex partner: Not on  file    Emotionally abused: Not on file    Physically abused: Not on file    Forced sexual activity: Not on file  Other Topics Concern  . Not on file  Social History Narrative   Lives with husband Reggie and dog named Hachi.   Religious or personal believes: [employer]   Has an advance directive, would want her husband to make medical decisions for her if she were unable to do so.   Does not exercise regularly.  Does walk sometimes.   For fun she likes to eat out, travel, spent time with family.    Family History  Problem Relation Age of Onset  . Pancreatic cancer Mother   . Heart attack Father        age 33, had 3 heart attacks from blood clots   . Other Father        clotting disorder  . Colon cancer Sister   . Breast cancer Paternal Aunt     Past Medical History:  Diagnosis Date  . Acid reflux   . Environmental allergies   . Hyperlipidemia 2014  . Left patella fracture 2005    Past Surgical History:  Procedure Laterality Date  . DILATION AND CURETTAGE, DIAGNOSTIC / THERAPEUTIC     for nonmalignant polyp    Current Outpatient Medications  Medication Sig Dispense Refill  . acetaminophen (TYLENOL) 325 MG tablet Take 650 mg by mouth every 6 (six) hours as needed.    Marland Kitchen aspirin EC 81 MG tablet Take 1 tablet (81 mg total) by mouth daily. 60 tablet 2  . CALCIUM CITRATE PO Take 1 tablet by mouth daily.    . Cetirizine HCl (KLS ALLER-TEC PO) Take 10 mg by mouth daily.    . fluticasone (FLONASE) 50 MCG/ACT nasal spray Place 1 spray into both nostrils daily. 1 spray in each nostril every day 16 g 12  . nitroGLYCERIN (NITROSTAT) 0.4 MG SL tablet Place 1 tablet (0.4 mg total) under the tongue every 5 (five) minutes as needed for chest pain. 50 tablet 3  . ranitidine (ZANTAC) 150 MG capsule Take 150 mg by mouth as needed.    . rosuvastatin (CRESTOR) 20 MG tablet Take 1 tablet (20 mg total) by mouth daily. 90 tablet 3   No current facility-administered medications for this visit.      Allergies as of 05/31/2018 - Review Complete 05/31/2018  Allergen Reaction Noted  . Osteoporosis support [a-g pro] Other (See Comments) 05/31/2018    Vitals: BP Marland Kitchen)  146/80   Pulse 71   Ht 5\' 3"  (1.6 m)   Wt 162 lb (73.5 kg)   BMI 28.70 kg/m  Last Weight:  Wt Readings from Last 1 Encounters:  05/31/18 162 lb (73.5 kg)   ZOX:WRUE mass index is 28.7 kg/m.     Last Height:   Ht Readings from Last 1 Encounters:  05/31/18 5\' 3"  (1.6 m)    Physical exam:  General: The patient is awake, alert and appears not in acute distress. The patient is well groomed. Head: Normocephalic, atraumatic. Neck is supple. Mallampati 4,  Swollen uvula  neck circumference: 15.5 ". Nasal airflow , TMJ is  evident . Retrognathia is seen. Never wore  braces.  Cardiovascular:  Regular rate and rhythm , without  murmurs or carotid bruit, and without distended neck veins. Respiratory: Lungs are clear to auscultation. Skin:  Without evidence of edema, or rash Trunk: BMI is 29. The patient's posture is erect  Neurologic exam : The patient is awake and alert, oriented to place and time.  Attention span & concentration ability appears normal.  Speech is fluent,  without dysarthria, dysphonia or aphasia.  Mood and affect are appropriate.  Cranial nerves: Pupils are equal and briskly reactive to light.  Funduscopic exam without evidence of pallor or edema.  Extraocular movements  in vertical and horizontal planes intact and without nystagmus. Visual fields by finger perimetry are intact. Hearing to finger rub intact.  Facial sensation intact to fine touch. Facial motor strength is symmetric and tongue and uvula move midline. Shoulder shrug was symmetrical.  Motor exam:  Normal tone, muscle bulk and symmetric strength in all extremities. Sensory:  Fine touch, pinprick and vibration were tested in all extremities. Proprioception tested in the upper extremities was normal. Coordination: Rapid alternating  movements in the fingers/hands was normal. Finger-to-nose maneuver  normal without evidence of ataxia, dysmetria or tremor. Gait and station: Patient walks without assistive device and is able unassisted to climb up to the exam table. Strength within normal limits. Stance is stable and normal.  Toe and hell stand were tested. .Tandem gait is unfragmented. Turns with 3 Steps.  Deep tendon reflexes: in the  upper and lower extremities are symmetric and intact.   Assessment:  After physical and neurologic examination, review of laboratory studies,  Personal review of imaging studies, reports of other /same  Imaging studies, results of polysomnography and / or neurophysiology testing and pre-existing records as far as provided in visit., my assessment is   1)  Snoring and witnessed apnea -  this may contribute to SOB, but likely SOB and apnea are both symptoms of a common underlying problem.   2)  Reduced exercise capacity and coughing, post nasal drip.   The patient was advised of the nature of the diagnosed disorder , the treatment options and the  risks for general health and wellness arising from not treating the condition.   I spent more than 45 minutes of face to face time with the patient.  Greater than 50% of time was spent in counseling and coordination of care. We have discussed the diagnosis and differential and I answered the patient's questions.    Plan:  Treatment plan and additional workup :  Asthmatic allergic manifestations? She is relatively new to the [place] area and her SOB has worsened.  SLEEP test ordered. Need to screen for hypoxemia.    Melvyn Novas, MD 05/31/2018, 10:00 AM  Certified in Neurology by ABPN Certified in Sleep Medicine  by [facility] [address], [place], [place] [address]

## 2018-05-31 NOTE — Patient Instructions (Signed)
Sleep Study  [name] , you will be called by our office to  Be scheduled for a sleep study .   You will arrive either at 8 or 9 pm and  leave next morning between 5:00 a.m - 6:00 a.m.  The sleep study consists of a recording of your brain waves (EEG). Breathing, heart rate and rhythm (ECG), oxygen level, eye movement, and leg movement.  The technician will glue or or paste several electrodes to your scalp, face, chest and legs.  You will have belts around your chest and abdomen to record breathing and a finger clasp to check blood oxygen levels.  A tube at your mouth and nose will detect airflow.  There are no needle sticks or painful procedures of any sort.  You will have your own room, and we will make every effort to attend to your comfort and privacy.  Please prepare for your study by the following steps:   Please avoid coffee, tea, soda, chocolate and other caffeine foods or beverages after 12:00 noon on the day of your sleep study.   You must arrive with clean (no oils), conditioners or make up, and please make sure that you wash your hair to ensure that your hair and scalp are clean, dry and free of any hair extensions on the day of your study.  This will help to get a good reading of study.  Please try not to nap on the day of your study.  Please bring a list of all your medications.  Bring any medications that you might need during the time you are within the laboratory, including insulin, sleeping pills, pain medication and anxiety medications.  Bring snacks, water or juice  Please bring clothes to sleep in and your normal overnight bag.  Please leave valuable at home, as we will not be responsible for any lost items.  If you have any further questions, please feel free to call our office. Thank you  Melvyn Novas, MD

## 2018-06-04 ENCOUNTER — Ambulatory Visit
Admission: RE | Admit: 2018-06-04 | Discharge: 2018-06-04 | Disposition: A | Discharge status: Home / Self Care | Payer: Medicare HMO | Source: Ambulatory Visit | Attending: Family Medicine | Admitting: Family Medicine

## 2018-06-04 ENCOUNTER — Telehealth: Payer: Self-pay

## 2018-06-04 ENCOUNTER — Other Ambulatory Visit: Payer: Self-pay | Admitting: Neurology

## 2018-06-04 DIAGNOSIS — R0602 Shortness of breath: Secondary | ICD-10-CM

## 2018-06-04 DIAGNOSIS — I209 Angina pectoris, unspecified: Secondary | ICD-10-CM

## 2018-06-04 DIAGNOSIS — K219 Gastro-esophageal reflux disease without esophagitis: Secondary | ICD-10-CM

## 2018-06-04 DIAGNOSIS — G478 Other sleep disorders: Secondary | ICD-10-CM

## 2018-06-04 DIAGNOSIS — R0683 Snoring: Secondary | ICD-10-CM

## 2018-06-04 DIAGNOSIS — R928 Other abnormal and inconclusive findings on diagnostic imaging of breast: Secondary | ICD-10-CM

## 2018-06-04 DIAGNOSIS — R922 Inconclusive mammogram: Secondary | ICD-10-CM | POA: Diagnosis not present

## 2018-06-04 DIAGNOSIS — G479 Sleep disorder, unspecified: Secondary | ICD-10-CM

## 2018-06-04 DIAGNOSIS — N6011 Diffuse cystic mastopathy of right breast: Secondary | ICD-10-CM | POA: Diagnosis not present

## 2018-06-04 NOTE — Telephone Encounter (Signed)
[employer] medicare denied in lab sleep study, Need HST order

## 2018-06-04 NOTE — Telephone Encounter (Signed)
Order placed

## 2018-06-12 ENCOUNTER — Other Ambulatory Visit: Payer: Self-pay | Admitting: Internal Medicine

## 2018-06-12 ENCOUNTER — Encounter: Payer: Self-pay | Admitting: Internal Medicine

## 2018-06-12 ENCOUNTER — Ambulatory Visit (INDEPENDENT_AMBULATORY_CARE_PROVIDER_SITE_OTHER): Payer: Medicare HMO | Admitting: Internal Medicine

## 2018-06-12 VITALS — BP 150/84 | HR 76 | Ht 63.0 in | Wt 163.6 lb

## 2018-06-12 DIAGNOSIS — R0683 Snoring: Secondary | ICD-10-CM

## 2018-06-12 DIAGNOSIS — I2089 Other forms of angina pectoris: Secondary | ICD-10-CM

## 2018-06-12 DIAGNOSIS — I208 Other forms of angina pectoris: Secondary | ICD-10-CM

## 2018-06-12 DIAGNOSIS — R0600 Dyspnea, unspecified: Secondary | ICD-10-CM | POA: Diagnosis not present

## 2018-06-12 DIAGNOSIS — E782 Mixed hyperlipidemia: Secondary | ICD-10-CM | POA: Diagnosis not present

## 2018-06-12 NOTE — Consult Note (Signed)
Cardiology Office Note:    Date:  06/12/2018   ID:  Jaime Chavez, DOB 09/09/1940, MRN [id number]  PCP:  Yoo, Elsia J, DO  Cardiologist:  Vermon Grays A Marke Goodwyn, MD  Electrophysiologist:  None   Referring MD: Frank, Peter, MD   Chest pain  History of Present Illness:    Jaime Chavez is a 77 y.o. female with a hx of GERD, hyperlipidemia, and environmental allergies who presents today for evaluation of shortness of breath and chest pain with exertion.  She describes this discomfort as a chest pain that is a pressure that progresses to a pain. It starts in the center of her chest and radiates to her left arm and beneath her breasts.  She denies radiation to her back or jaw.  She primarily has pain with exertion, however does describe two episodes of pain while seated.  Pain is relieved by resting.  Concern was raised for stable angina, and referral to cardiology was made. She was given a prescription for sublingual nitroglycerin, and now takes that when she feels the pressure come on. It reliably relieves her symptoms. She can reproduce her symptoms when walking just one block on an incline. This has been progressively worsening since June.   She denies palpitations, PND, orthopnea, or leg swelling. Denies syncope. She occasionally notes lightheadedness with a sense of heart racing. She had one episode of significant presyncope as a teenager which sounds as if it were vasovagal (neurocardiogenic).   She describes snoring and is currently undergoing a workup for sleep apnea.   Never smoker, occasional alcohol consumption, no recreational drug use or herbal supplements.   Family history of CAD but no family history of early MI or sudden death.  She has been placed on rosuvastatin 20 mg daily which she is tolerating well.  Past Medical History:  Diagnosis Date  . Acid reflux   . Environmental allergies   . Hyperlipidemia 2014  . Left patella fracture 2005    Past Surgical History:    Procedure Laterality Date  . DILATION AND CURETTAGE, DIAGNOSTIC / THERAPEUTIC     for nonmalignant polyp    Current Medications: Current Meds  Medication Sig  . aspirin EC 81 MG tablet Take 1 tablet (81 mg total) by mouth daily.  . rosuvastatin (CRESTOR) 20 MG tablet Take 1 tablet (20 mg total) by mouth daily.     Allergies:   Osteoporosis support [a-g pro] and Alendronate sodium   Social History   Socioeconomic History  . Marital status: Married    Spouse name: Not on file  . Number of children: Not on file  . Years of education: high school graduate  . Highest education level: Not on file  Occupational History  . Occupation: retired  Social Needs  . Financial resource strain: Not on file  . Food insecurity:    Worry: Not on file    Inability: Not on file  . Transportation needs:    Medical: Not on file    Non-medical: Not on file  Tobacco Use  . Smoking status: Never Smoker  . Smokeless tobacco: Never Used  Substance and Sexual Activity  . Alcohol use: Yes    Comment: wine and liquor, about 1 drink per day  . Drug use: Never  . Sexual activity: Not on file  Lifestyle  . Physical activity:    Days per week: Not on file    Minutes per session: Not on file  . Stress: Not on file  Relationships  .   Social connections:    Talks on phone: Not on file    Gets together: Not on file    Attends religious service: Not on file    Active member of club or organization: Not on file    Attends meetings of clubs or organizations: Not on file    Relationship status: Not on file  Other Topics Concern  . Not on file  Social History Narrative   Lives with husband Jaime Chavez and dog named Hachi.   Religious or personal believes: Christian   Has an advance directive, would want her husband to make medical decisions for her if she were unable to do so.   Does not exercise regularly.  Does walk sometimes.   For fun she likes to eat out, travel, spent time with family.     Family  History: The patient's family history includes Breast cancer in her paternal aunt; Colon cancer in her sister; Heart attack in her father; Other in her father; Pancreatic cancer in her mother.  ROS:   Please see the history of present illness.    All other systems reviewed and are negative.  EKGs/Labs/Other Studies Reviewed:    The following studies were reviewed today:  EKG:  EKG is ordered today.  The ekg ordered today demonstrates normal sinus rhythm, ventricular rate 76 bpm.  Recent Labs: 02/25/2018: BUN 11; Creatinine, Ser 0.71; Hemoglobin 14.8; Platelets 275; Potassium 5.1; Sodium 141; TSH 4.270  Recent Lipid Panel    Component Value Date/Time   CHOL 314 (H) 02/28/2018 0840   TRIG 262 (H) 02/28/2018 0840   HDL 54 02/28/2018 0840   CHOLHDL 5.8 (H) 02/28/2018 0840   LDLCALC 208 (H) 02/28/2018 0840    Physical Exam:    VS:  BP (!) 150/84   Pulse 76   Ht 5' 3" (1.6 m)   Wt 163 lb 9.6 oz (74.2 kg)   BMI 28.98 kg/m     Wt Readings from Last 3 Encounters:  06/12/18 163 lb 9.6 oz (74.2 kg)  05/31/18 162 lb (73.5 kg)  05/09/18 163 lb (73.9 kg)     GEN: Well nourished, well developed in no acute distress HEENT: Normal NECK: No JVD; No carotid bruits LYMPHATICS: No lymphadenopathy CARDIAC: RRR, no murmurs, rubs, gallops RESPIRATORY:  Clear to auscultation without rales, wheezing or rhonchi  ABDOMEN: Soft, non-tender, non-distended MUSCULOSKELETAL:  No edema; No deformity  SKIN: Warm and dry NEUROLOGIC:  Alert and oriented x 3 PSYCHIATRIC:  Normal affect   ASSESSMENT:    1. Stable angina (HCC)   2. Mixed hyperlipidemia   3. Dyspnea, unspecified type   4. Snoring    PLAN:    In order of problems listed above:  Her symptoms are concerning for stable angina with very limiting symptoms.  I will refer her for coronary angiography with possible percutaneous coronary intervention.  We have discussed today that in the setting of stable angina, PCI is most indicated for  symptom management.  She notes that her symptoms are significantly limiting to her ability to be active.  We have discussed informed consent for the procedure and she and her husband agreed to proceed.  Please see informed consent discussion below.  We will also obtain a right heart catheterization given her ongoing history of dyspnea for which has been dealing with as well.  She asked today if stents could relieve her dyspnea.  We discussed that while this is a possibility there may be other factors at play including sleep apnea   and right-sided heart pressure changes that may be influencing her other symptoms.  I have encouraged her to continue to take nitroglycerin for chest pain and have counseled her that if she has to take 2-3 nitroglycerin without any impact in her chest pain that would be the appropriate time to present to the emergency department.    For her hyperlipidemia, she is currently on rosuvastatin 20 mg daily which is the correct medication and dose for her.  No changes indicated at this time.  INFORMED CONSENT was obtained today: I have reviewed the risks, indications, and alternatives to cardiac catheterization, possible angioplasty, and stenting with the patient. Risks include but are not limited to bleeding, infection, vascular injury, stroke, myocardial infection, arrhythmia, kidney injury, radiation-related injury in the case of prolonged fluoroscopy use, emergency cardiac surgery, and death. The patient understands the risks of serious complication is 1-2 in 1000 with diagnostic cardiac cath and 1-2% or less with angioplasty/stenting.   Medication Adjustments/Labs and Tests Ordered: Current medicines are reviewed at length with the patient today.  Concerns regarding medicines are outlined above.  Orders Placed This Encounter  Procedures  . CBC with Differential  . Basic metabolic panel  . EKG 12-Lead   No orders of the defined types were placed in this encounter.   Patient  Instructions  Medication Instructions:  Your physician recommends that you continue on your current medications as directed. Please refer to the Current Medication list given to you today.  If you need a refill on your cardiac medications before your next appointment, please call your pharmacy.   Lab work: Bmet and Cbc today If you have labs (blood work) drawn today and your tests are completely normal, you will receive your results only by: . MyChart Message (if you have MyChart) OR . A paper copy in the mail If you have any lab test that is abnormal or we need to change your treatment, we will call you to review the results.  Testing/Procedures: Your physician has requested that you have a cardiac catheterization. Cardiac catheterization is used to diagnose and/or treat various heart conditions. Doctors may recommend this procedure for a number of different reasons. The most common reason is to evaluate chest pain. Chest pain can be a symptom of coronary artery disease (CAD), and cardiac catheterization can show whether plaque is narrowing or blocking your heart's arteries. This procedure is also used to evaluate the valves, as well as measure the blood flow and oxygen levels in different parts of your heart. For further information please visit www.cardiosmart.org. Please follow instruction sheet, as given.    Follow-Up: At [facility], you and your health needs are our priority.  As part of our continuing mission to provide you with exceptional heart care, we have created designated Provider Care Teams.  These Care Teams include your primary Cardiologist (physician) and Advanced Practice Providers (APPs -  Physician Assistants and Nurse Practitioners) who all work together to provide you with the care you need, when you need it. Your physician recommends that you schedule a follow-up appointment in: 2 weeks with Dr.Kahlen Morais   Any Other Special Instructions Will Be Listed Below (If  Applicable).      [facility] [address] [place] [address] Dept: [phone] Loc: [phone]  Cylee Chivers  06/12/2018  You are scheduled for a Cardiac Catheterization on Thursday, October 24 with Dr. Christopher End.  1. Please arrive at the [facility] (  Main Entrance A) at [facility]: [address] [place], [place] [address] at 10:00 AM (This time is two hours before your procedure to ensure your preparation). Free valet parking service is available.   Special note: Every effort is made to have your procedure done on time. Please understand that emergencies sometimes delay scheduled procedures.  2. Diet: Do not eat solid foods after midnight.  The patient may have clear liquids until 5am upon the day of the procedure.  3. Labs: You will need to have blood drawn on today (Bmet, Cbc)  4. Medication instructions in preparation for your procedure:   Contrast Allergy: No    Current Outpatient Medications (Cardiovascular):  .  rosuvastatin (CRESTOR) 20 MG tablet, Take 1 tablet (20 mg total) by mouth daily. .  nitroGLYCERIN (NITROSTAT) 0.4 MG SL tablet, Place 1 tablet (0.4 mg total) under the tongue every 5 (five) minutes as needed for chest pain. (Patient not taking: Reported on 06/12/2018)  Current Outpatient Medications (Respiratory):  .  Cetirizine HCl (KLS ALLER-TEC PO), Take 10 mg by mouth daily. .  fluticasone (FLONASE) 50 MCG/ACT nasal spray, Place 1 spray into both nostrils daily. 1 spray in each nostril every day (Patient not taking: Reported on 06/12/2018)  Current Outpatient Medications (Analgesics):  .  aspirin EC 81 MG tablet, Take 1 tablet (81 mg total) by mouth daily. .  acetaminophen (TYLENOL) 325 MG tablet, Take 650 mg by mouth every 6 (six) hours as needed.   Current Outpatient Medications (Other):  .  CALCIUM CITRATE PO, Take 1 tablet by mouth daily. .   ranitidine (ZANTAC) 150 MG capsule, Take 150 mg by mouth as needed. *For reference purposes while preparing patient instructions.   Delete this med list prior to printing instructions for patient.*   On the morning of your procedure, take your Aspirin and any morning medicines NOT listed above.  You may use sips of water.  5. Plan for one night stay--bring personal belongings. 6. Bring a current list of your medications and current insurance cards. 7. You MUST have a responsible person to drive you home. 8. Someone MUST be with you the first 24 hours after you arrive home or your discharge will be delayed. 9. Please wear clothes that are easy to get on and off and wear slip-on shoes.  Thank you for allowing us to care for you!   -- [facility]      Signed, Sussan Meter A Debby Clyne, MD  06/12/2018 5:03 PM    [facility]  

## 2018-06-12 NOTE — H&P (View-Only) (Signed)
Cardiology Office Note:    Date:  06/12/2018   ID:  Jennife Chavez, DOB [birth year], MRN [id number]  PCP:  Leland Her, DO  Cardiologist:  Parke Poisson, MD  Electrophysiologist:  None   Referring MD: Mirian Mo, MD   Chest pain  History of Present Illness:    Jaime Chavez is a 77 y.o. female with a hx of GERD, hyperlipidemia, and environmental allergies who presents today for evaluation of shortness of breath and chest pain with exertion.  She describes this discomfort as a chest pain that is a pressure that progresses to a pain. It starts in the center of her chest and radiates to her left arm and beneath her breasts.  She denies radiation to her back or jaw.  She primarily has pain with exertion, however does describe two episodes of pain while seated.  Pain is relieved by resting.  Concern was raised for stable angina, and referral to cardiology was made. She was given a prescription for sublingual nitroglycerin, and now takes that when she feels the pressure come on. It reliably relieves her symptoms. She can reproduce her symptoms when walking just one block on an incline. This has been progressively worsening since June.   She denies palpitations, PND, orthopnea, or leg swelling. Denies syncope. She occasionally notes lightheadedness with a sense of heart racing. She had one episode of significant presyncope as a teenager which sounds as if it were vasovagal (neurocardiogenic).   She describes snoring and is currently undergoing a workup for sleep apnea.   Never smoker, occasional alcohol consumption, no recreational drug use or herbal supplements.   Family history of CAD but no family history of early MI or sudden death.  She has been placed on rosuvastatin 20 mg daily which she is tolerating well.  Past Medical History:  Diagnosis Date  . Acid reflux   . Environmental allergies   . Hyperlipidemia 2014  . Left patella fracture 2005    Past Surgical History:    Procedure Laterality Date  . DILATION AND CURETTAGE, DIAGNOSTIC / THERAPEUTIC     for nonmalignant polyp    Current Medications: Current Meds  Medication Sig  . aspirin EC 81 MG tablet Take 1 tablet (81 mg total) by mouth daily.  . rosuvastatin (CRESTOR) 20 MG tablet Take 1 tablet (20 mg total) by mouth daily.     Allergies:   Osteoporosis support [a-g pro] and Alendronate sodium   Social History   Socioeconomic History  . Marital status: Married    Spouse name: Not on file  . Number of children: Not on file  . Years of education: high school graduate  . Highest education level: Not on file  Occupational History  . Occupation: retired  Engineer, production  . Financial resource strain: Not on file  . Food insecurity:    Worry: Not on file    Inability: Not on file  . Transportation needs:    Medical: Not on file    Non-medical: Not on file  Tobacco Use  . Smoking status: Never Smoker  . Smokeless tobacco: Never Used  Substance and Sexual Activity  . Alcohol use: Yes    Comment: wine and liquor, about 1 drink per day  . Drug use: Never  . Sexual activity: Not on file  Lifestyle  . Physical activity:    Days per week: Not on file    Minutes per session: Not on file  . Stress: Not on file  Relationships  .  Social connections:    Talks on phone: Not on file    Gets together: Not on file    Attends religious service: Not on file    Active member of club or organization: Not on file    Attends meetings of clubs or organizations: Not on file    Relationship status: Not on file  Other Topics Concern  . Not on file  Social History Narrative   Lives with husband Jaime Chavez and dog named Hachi.   Religious or personal believes: Jaime Chavez   Has an advance directive, would want her husband to make medical decisions for her if she were unable to do so.   Does not exercise regularly.  Does walk sometimes.   For fun she likes to eat out, travel, spent time with family.     Family  History: The patient's family history includes Breast cancer in her paternal aunt; Colon cancer in her sister; Heart attack in her father; Other in her father; Pancreatic cancer in her mother.  ROS:   Please see the history of present illness.    All other systems reviewed and are negative.  EKGs/Labs/Other Studies Reviewed:    The following studies were reviewed today:  EKG:  EKG is ordered today.  The ekg ordered today demonstrates normal sinus rhythm, ventricular rate 76 bpm.  Recent Labs: 02/25/2018: BUN 11; Creatinine, Ser 0.71; Hemoglobin 14.8; Platelets 275; Potassium 5.1; Sodium 141; TSH 4.270  Recent Lipid Panel    Component Value Date/Time   CHOL 314 (H) 02/28/2018 0840   TRIG 262 (H) 02/28/2018 0840   HDL 54 02/28/2018 0840   CHOLHDL 5.8 (H) 02/28/2018 0840   LDLCALC 208 (H) 02/28/2018 0840    Physical Exam:    VS:  BP (!) 150/84   Pulse 76   Ht 5\' 3"  (1.6 m)   Wt 163 lb 9.6 oz (74.2 kg)   BMI 28.98 kg/m     Wt Readings from Last 3 Encounters:  06/12/18 163 lb 9.6 oz (74.2 kg)  05/31/18 162 lb (73.5 kg)  05/09/18 163 lb (73.9 kg)     GEN: Well nourished, well developed in no acute distress HEENT: Normal NECK: No JVD; No carotid bruits LYMPHATICS: No lymphadenopathy CARDIAC: RRR, no murmurs, rubs, gallops RESPIRATORY:  Clear to auscultation without rales, wheezing or rhonchi  ABDOMEN: Soft, non-tender, non-distended MUSCULOSKELETAL:  No edema; No deformity  SKIN: Warm and dry NEUROLOGIC:  Alert and oriented x 3 PSYCHIATRIC:  Normal affect   ASSESSMENT:    1. Stable angina (HCC)   2. Mixed hyperlipidemia   3. Dyspnea, unspecified type   4. Snoring    PLAN:    In order of problems listed above:  Her symptoms are concerning for stable angina with very limiting symptoms.  I will refer her for coronary angiography with possible percutaneous coronary intervention.  We have discussed today that in the setting of stable angina, PCI is most indicated for  symptom management.  She notes that her symptoms are significantly limiting to her ability to be active.  We have discussed informed consent for the procedure and she and her husband agreed to proceed.  Please see informed consent discussion below.  We will also obtain a right heart catheterization given her ongoing history of dyspnea for which has been dealing with as well.  She asked today if stents could relieve her dyspnea.  We discussed that while this is a possibility there may be other factors at play including sleep apnea  and right-sided heart pressure changes that may be influencing her other symptoms.  I have encouraged her to continue to take nitroglycerin for chest pain and have counseled her that if she has to take 2-3 nitroglycerin without any impact in her chest pain that would be the appropriate time to present to the emergency department.    For her hyperlipidemia, she is currently on rosuvastatin 20 mg daily which is the correct medication and dose for her.  No changes indicated at this time.  INFORMED CONSENT was obtained today: I have reviewed the risks, indications, and alternatives to cardiac catheterization, possible angioplasty, and stenting with the patient. Risks include but are not limited to bleeding, infection, vascular injury, stroke, myocardial infection, arrhythmia, kidney injury, radiation-related injury in the case of prolonged fluoroscopy use, emergency cardiac surgery, and death. The patient understands the risks of serious complication is 1-2 in 1000 with diagnostic cardiac cath and 1-2% or less with angioplasty/stenting.   Medication Adjustments/Labs and Tests Ordered: Current medicines are reviewed at length with the patient today.  Concerns regarding medicines are outlined above.  Orders Placed This Encounter  Procedures  . CBC with Differential  . Basic metabolic panel  . EKG 12-Lead   No orders of the defined types were placed in this encounter.   Patient  Instructions  Medication Instructions:  Your physician recommends that you continue on your current medications as directed. Please refer to the Current Medication list given to you today.  If you need a refill on your cardiac medications before your next appointment, please call your pharmacy.   Lab work: Scientist, product/process development today If you have labs (blood work) drawn today and your tests are completely normal, you will receive your results only by: Marland Kitchen MyChart Message (if you have MyChart) OR . A paper copy in the mail If you have any lab test that is abnormal or we need to change your treatment, we will call you to review the results.  Testing/Procedures: Your physician has requested that you have a cardiac catheterization. Cardiac catheterization is used to diagnose and/or treat various heart conditions. Doctors may recommend this procedure for a number of different reasons. The most common reason is to evaluate chest pain. Chest pain can be a symptom of coronary artery disease (CAD), and cardiac catheterization can show whether plaque is narrowing or blocking your heart's arteries. This procedure is also used to evaluate the valves, as well as measure the blood flow and oxygen levels in different parts of your heart. For further information please visit [email or web address] Please follow instruction sheet, as given.    Follow-Up: At [facility], you and your health needs are our priority.  As part of our continuing mission to provide you with exceptional heart care, we have created designated Provider Care Teams.  These Care Teams include your primary Cardiologist (physician) and Advanced Practice Providers (APPs -  Physician Assistants and Nurse Practitioners) who all work together to provide you with the care you need, when you need it. Your physician recommends that you schedule a follow-up appointment in: 2 weeks with Dr.Juergen Hardenbrook   Any Other Special Instructions Will Be Listed Below (If  Applicable).      [facility] CARDIOVASCULAR DIVISION [facility] [address] [place] [address] Dept: [phone] Loc: [phone]  Wrenn Willcox  06/12/2018  You are scheduled for a Cardiac Catheterization on Thursday, October 24 with Dr. Cristal Deer End.  1. Please arrive at the [facility] (  Main Entrance A) at [facility]: [address] [place], [place] [address] at 10:00 AM (This time is two hours before your procedure to ensure your preparation). Free valet parking service is available.   Special note: Every effort is made to have your procedure done on time. Please understand that emergencies sometimes delay scheduled procedures.  2. Diet: Do not eat solid foods after midnight.  The patient may have clear liquids until 5am upon the day of the procedure.  3. Labs: You will need to have blood drawn on today (Bmet, Cbc)  4. Medication instructions in preparation for your procedure:   Contrast Allergy: No    Current Outpatient Medications (Cardiovascular):  .  rosuvastatin (CRESTOR) 20 MG tablet, Take 1 tablet (20 mg total) by mouth daily. .  nitroGLYCERIN (NITROSTAT) 0.4 MG SL tablet, Place 1 tablet (0.4 mg total) under the tongue every 5 (five) minutes as needed for chest pain. (Patient not taking: Reported on 06/12/2018)  Current Outpatient Medications (Respiratory):  Marland Kitchen  Cetirizine HCl (KLS ALLER-TEC PO), Take 10 mg by mouth daily. .  fluticasone (FLONASE) 50 MCG/ACT nasal spray, Place 1 spray into both nostrils daily. 1 spray in each nostril every day (Patient not taking: Reported on 06/12/2018)  Current Outpatient Medications (Analgesics):  .  aspirin EC 81 MG tablet, Take 1 tablet (81 mg total) by mouth daily. Marland Kitchen  acetaminophen (TYLENOL) 325 MG tablet, Take 650 mg by mouth every 6 (six) hours as needed.   Current Outpatient Medications (Other):  Marland Kitchen  CALCIUM CITRATE PO, Take 1 tablet by mouth daily. .   ranitidine (ZANTAC) 150 MG capsule, Take 150 mg by mouth as needed. *For reference purposes while preparing patient instructions.   Delete this med list prior to printing instructions for patient.*   On the morning of your procedure, take your Aspirin and any morning medicines NOT listed above.  You may use sips of water.  5. Plan for one night stay--bring personal belongings. 6. Bring a current list of your medications and current insurance cards. 7. You MUST have a responsible person to drive you home. 8. Someone MUST be with you the first 24 hours after you arrive home or your discharge will be delayed. 9. Please wear clothes that are easy to get on and off and wear slip-on shoes.  Thank you for allowing [place] to care for you!   -- [facility]      Signed, Parke Poisson, MD  06/12/2018 5:03 PM    [facility]

## 2018-06-12 NOTE — Patient Instructions (Signed)
Medication Instructions:  Your physician recommends that you continue on your current medications as directed. Please refer to the Current Medication list given to you today.  If you need a refill on your cardiac medications before your next appointment, please call your pharmacy.   Lab work: Scientist, product/process development today If you have labs (blood work) drawn today and your tests are completely normal, you will receive your results only by: Marland Kitchen MyChart Message (if you have MyChart) OR . A paper copy in the mail If you have any lab test that is abnormal or we need to change your treatment, we will call you to review the results.  Testing/Procedures: Your physician has requested that you have a cardiac catheterization. Cardiac catheterization is used to diagnose and/or treat various heart conditions. Doctors may recommend this procedure for a number of different reasons. The most common reason is to evaluate chest pain. Chest pain can be a symptom of coronary artery disease (CAD), and cardiac catheterization can show whether plaque is narrowing or blocking your heart's arteries. This procedure is also used to evaluate the valves, as well as measure the blood flow and oxygen levels in different parts of your heart. For further information please visit [email or web address] Please follow instruction sheet, as given.    Follow-Up: At [facility] you and your health needs are our priority.  As part of our continuing mission to provide you with exceptional heart care, we have created designated Provider Care Teams.  These Care Teams include your primary Cardiologist (physician) and Advanced Practice Providers (APPs -  Physician Assistants and Nurse Practitioners) who all work together to provide you with the care you need, when you need it. Your physician recommends that you schedule a follow-up appointment in: 2 weeks with Dr.Acharya   Any Other Special Instructions Will Be Listed Below (If  Applicable).      [facility] CARDIOVASCULAR DIVISION [facility] [address] [place] [address] Dept: [phone] Loc: [phone]  Jaime Chavez  06/12/2018  You are scheduled for a Cardiac Catheterization on Thursday, October 24 with Dr. Cristal Deer End.  1. Please arrive at the [facility] (Main Entrance A) at [facility]: [address] [place], [place] [address] at 10:00 AM (This time is two hours before your procedure to ensure your preparation). Free valet parking service is available.   Special note: Every effort is made to have your procedure done on time. Please understand that emergencies sometimes delay scheduled procedures.  2. Diet: Do not eat solid foods after midnight.  The patient may have clear liquids until 5am upon the day of the procedure.  3. Labs: You will need to have blood drawn on today (Bmet, Cbc)  4. Medication instructions in preparation for your procedure:   Contrast Allergy: No    Current Outpatient Medications (Cardiovascular):  .  rosuvastatin (CRESTOR) 20 MG tablet, Take 1 tablet (20 mg total) by mouth daily. .  nitroGLYCERIN (NITROSTAT) 0.4 MG SL tablet, Place 1 tablet (0.4 mg total) under the tongue every 5 (five) minutes as needed for chest pain. (Patient not taking: Reported on 06/12/2018)  Current Outpatient Medications (Respiratory):  Marland Kitchen  Cetirizine HCl (KLS ALLER-TEC PO), Take 10 mg by mouth daily. .  fluticasone (FLONASE) 50 MCG/ACT nasal spray, Place 1 spray into both nostrils daily. 1 spray in each nostril every day (Patient not taking: Reported on 06/12/2018)  Current Outpatient Medications (Analgesics):  .  aspirin EC 81 MG tablet,  Take 1 tablet (81 mg total) by mouth daily. Marland Kitchen  acetaminophen (TYLENOL) 325 MG tablet, Take 650 mg by mouth every 6 (six) hours as needed.   Current Outpatient Medications (Other):  Marland Kitchen  CALCIUM CITRATE PO, Take 1 tablet by mouth daily. .   ranitidine (ZANTAC) 150 MG capsule, Take 150 mg by mouth as needed. *For reference purposes while preparing patient instructions.   Delete this med list prior to printing instructions for patient.*   On the morning of your procedure, take your Aspirin and any morning medicines NOT listed above.  You may use sips of water.  5. Plan for one night stay--bring personal belongings. 6. Bring a current list of your medications and current insurance cards. 7. You MUST have a responsible person to drive you home. 8. Someone MUST be with you the first 24 hours after you arrive home or your discharge will be delayed. 9. Please wear clothes that are easy to get on and off and wear slip-on shoes.  Thank you for allowing [place] to care for you!   -- [place]

## 2018-06-13 ENCOUNTER — Inpatient Hospital Stay (HOSPITAL_COMMUNITY): Payer: Medicare HMO

## 2018-06-13 ENCOUNTER — Encounter (HOSPITAL_COMMUNITY): Payer: Self-pay | Admitting: Thoracic Surgery (Cardiothoracic Vascular Surgery)

## 2018-06-13 ENCOUNTER — Other Ambulatory Visit: Payer: Self-pay

## 2018-06-13 ENCOUNTER — Inpatient Hospital Stay (HOSPITAL_COMMUNITY)
Admission: RE | Admit: 2018-06-13 | Discharge: 2018-06-18 | DRG: 234 | Disposition: A | Discharge status: Home / Self Care | Payer: Medicare HMO | Attending: Thoracic Surgery (Cardiothoracic Vascular Surgery) | Admitting: Thoracic Surgery (Cardiothoracic Vascular Surgery)

## 2018-06-13 ENCOUNTER — Ambulatory Visit (HOSPITAL_COMMUNITY): Payer: Medicare HMO

## 2018-06-13 ENCOUNTER — Encounter (HOSPITAL_COMMUNITY)
Admission: RE | Disposition: A | Discharge status: Home / Self Care | Payer: Self-pay | Source: Home / Self Care | Attending: Thoracic Surgery (Cardiothoracic Vascular Surgery)

## 2018-06-13 DIAGNOSIS — Y838 Other surgical procedures as the cause of abnormal reaction of the patient, or of later complication, without mention of misadventure at the time of the procedure: Secondary | ICD-10-CM | POA: Diagnosis not present

## 2018-06-13 DIAGNOSIS — Z803 Family history of malignant neoplasm of breast: Secondary | ICD-10-CM | POA: Diagnosis not present

## 2018-06-13 DIAGNOSIS — Z832 Family history of diseases of the blood and blood-forming organs and certain disorders involving the immune mechanism: Secondary | ICD-10-CM | POA: Diagnosis not present

## 2018-06-13 DIAGNOSIS — Y92230 Patient room in hospital as the place of occurrence of the external cause: Secondary | ICD-10-CM | POA: Diagnosis not present

## 2018-06-13 DIAGNOSIS — Z973 Presence of spectacles and contact lenses: Secondary | ICD-10-CM

## 2018-06-13 DIAGNOSIS — Z01810 Encounter for preprocedural cardiovascular examination: Secondary | ICD-10-CM

## 2018-06-13 DIAGNOSIS — J302 Other seasonal allergic rhinitis: Secondary | ICD-10-CM | POA: Diagnosis present

## 2018-06-13 DIAGNOSIS — M791 Myalgia, unspecified site: Secondary | ICD-10-CM | POA: Diagnosis not present

## 2018-06-13 DIAGNOSIS — I351 Nonrheumatic aortic (valve) insufficiency: Secondary | ICD-10-CM

## 2018-06-13 DIAGNOSIS — E782 Mixed hyperlipidemia: Secondary | ICD-10-CM | POA: Diagnosis present

## 2018-06-13 DIAGNOSIS — R079 Chest pain, unspecified: Secondary | ICD-10-CM

## 2018-06-13 DIAGNOSIS — M160 Bilateral primary osteoarthritis of hip: Secondary | ICD-10-CM | POA: Diagnosis not present

## 2018-06-13 DIAGNOSIS — K219 Gastro-esophageal reflux disease without esophagitis: Secondary | ICD-10-CM | POA: Diagnosis present

## 2018-06-13 DIAGNOSIS — Z800 Family history of malignant neoplasm of digestive organs: Secondary | ICD-10-CM

## 2018-06-13 DIAGNOSIS — I081 Rheumatic disorders of both mitral and tricuspid valves: Secondary | ICD-10-CM | POA: Diagnosis not present

## 2018-06-13 DIAGNOSIS — I25119 Atherosclerotic heart disease of native coronary artery with unspecified angina pectoris: Secondary | ICD-10-CM

## 2018-06-13 DIAGNOSIS — E669 Obesity, unspecified: Secondary | ICD-10-CM | POA: Diagnosis present

## 2018-06-13 DIAGNOSIS — Z7951 Long term (current) use of inhaled steroids: Secondary | ICD-10-CM

## 2018-06-13 DIAGNOSIS — Z7982 Long term (current) use of aspirin: Secondary | ICD-10-CM | POA: Diagnosis not present

## 2018-06-13 DIAGNOSIS — R918 Other nonspecific abnormal finding of lung field: Secondary | ICD-10-CM | POA: Diagnosis not present

## 2018-06-13 DIAGNOSIS — Z6829 Body mass index (BMI) 29.0-29.9, adult: Secondary | ICD-10-CM | POA: Diagnosis not present

## 2018-06-13 DIAGNOSIS — I25110 Atherosclerotic heart disease of native coronary artery with unstable angina pectoris: Secondary | ICD-10-CM | POA: Diagnosis not present

## 2018-06-13 DIAGNOSIS — D62 Acute posthemorrhagic anemia: Secondary | ICD-10-CM | POA: Diagnosis not present

## 2018-06-13 DIAGNOSIS — R0683 Snoring: Secondary | ICD-10-CM | POA: Diagnosis not present

## 2018-06-13 DIAGNOSIS — Z951 Presence of aortocoronary bypass graft: Secondary | ICD-10-CM

## 2018-06-13 DIAGNOSIS — M545 Low back pain: Secondary | ICD-10-CM | POA: Diagnosis not present

## 2018-06-13 DIAGNOSIS — I200 Unstable angina: Secondary | ICD-10-CM | POA: Diagnosis present

## 2018-06-13 DIAGNOSIS — J90 Pleural effusion, not elsewhere classified: Secondary | ICD-10-CM | POA: Diagnosis not present

## 2018-06-13 DIAGNOSIS — Z79899 Other long term (current) drug therapy: Secondary | ICD-10-CM

## 2018-06-13 DIAGNOSIS — L7632 Postprocedural hematoma of skin and subcutaneous tissue following other procedure: Secondary | ICD-10-CM | POA: Diagnosis not present

## 2018-06-13 DIAGNOSIS — J9811 Atelectasis: Secondary | ICD-10-CM

## 2018-06-13 DIAGNOSIS — I208 Other forms of angina pectoris: Secondary | ICD-10-CM | POA: Diagnosis present

## 2018-06-13 DIAGNOSIS — R0602 Shortness of breath: Secondary | ICD-10-CM | POA: Diagnosis present

## 2018-06-13 DIAGNOSIS — Z8249 Family history of ischemic heart disease and other diseases of the circulatory system: Secondary | ICD-10-CM | POA: Diagnosis not present

## 2018-06-13 DIAGNOSIS — I209 Angina pectoris, unspecified: Secondary | ICD-10-CM | POA: Diagnosis present

## 2018-06-13 DIAGNOSIS — E785 Hyperlipidemia, unspecified: Secondary | ICD-10-CM | POA: Diagnosis not present

## 2018-06-13 DIAGNOSIS — Z888 Allergy status to other drugs, medicaments and biological substances status: Secondary | ICD-10-CM | POA: Diagnosis not present

## 2018-06-13 HISTORY — DX: Dyspnea, unspecified: R06.00

## 2018-06-13 HISTORY — DX: Atherosclerotic heart disease of native coronary artery with unspecified angina pectoris: I25.119

## 2018-06-13 HISTORY — PX: CARDIAC CATHETERIZATION: SHX172

## 2018-06-13 HISTORY — DX: Presence of aortocoronary bypass graft: Z95.1

## 2018-06-13 HISTORY — PX: RIGHT/LEFT HEART CATH AND CORONARY ANGIOGRAPHY: CATH118266

## 2018-06-13 LAB — URINALYSIS, COMPLETE (UACMP) WITH MICROSCOPIC
BACTERIA UA: NONE SEEN
BILIRUBIN URINE: NEGATIVE
Glucose, UA: NEGATIVE mg/dL
Hgb urine dipstick: NEGATIVE
KETONES UR: NEGATIVE mg/dL
LEUKOCYTES UA: NEGATIVE
Nitrite: NEGATIVE
Protein, ur: NEGATIVE mg/dL
Specific Gravity, Urine: 1.029 (ref 1.005–1.030)
pH: 6 (ref 5.0–8.0)

## 2018-06-13 LAB — BASIC METABOLIC PANEL
BUN/Creatinine Ratio: 20 (ref 12–28)
BUN: 13 mg/dL (ref 8–27)
CALCIUM: 9.1 mg/dL (ref 8.7–10.3)
CHLORIDE: 104 mmol/L (ref 96–106)
CO2: 26 mmol/L (ref 20–29)
Creatinine, Ser: 0.64 mg/dL (ref 0.57–1.00)
GFR calc Af Amer: 102 mL/min/{1.73_m2} (ref >59)
GFR calc non Af Amer: 88 mL/min/{1.73_m2} (ref >59)
GLUCOSE: 98 mg/dL (ref 65–99)
Potassium: 4.3 mmol/L (ref 3.5–5.2)
Sodium: 139 mmol/L (ref 134–144)

## 2018-06-13 LAB — POCT I-STAT 3, ART BLOOD GAS (G3+)
Acid-base deficit: 2 mmol/L (ref 0.0–2.0)
Bicarbonate: 22.4 mmol/L (ref 20.0–28.0)
O2 Saturation: 95 %
PCO2 ART: 38 mmHg (ref 32.0–48.0)
PH ART: 7.379 (ref 7.350–7.450)
TCO2: 24 mmol/L (ref 22–32)
pO2, Arterial: 77 mmHg — ABNORMAL LOW (ref 83.0–108.0)

## 2018-06-13 LAB — CBC WITH DIFFERENTIAL/PLATELET
BASOS ABS: 0 10*3/uL (ref 0.0–0.2)
Basos: 0 %
EOS (ABSOLUTE): 0.2 10*3/uL (ref 0.0–0.4)
Eos: 3 %
Hematocrit: 43.5 % (ref 34.0–46.6)
Hemoglobin: 14.5 g/dL (ref 11.1–15.9)
LYMPHS ABS: 2.5 10*3/uL (ref 0.7–3.1)
Lymphs: 33 %
MCH: 29.6 pg (ref 26.6–33.0)
MCHC: 33.3 g/dL (ref 31.5–35.7)
MCV: 89 fL (ref 79–97)
MONOCYTES: 8 %
MONOS ABS: 0.6 10*3/uL (ref 0.1–0.9)
Neutrophils Absolute: 4.3 10*3/uL (ref 1.4–7.0)
Neutrophils: 56 %
PLATELETS: 238 10*3/uL (ref 150–450)
RBC: 4.9 x10E6/uL (ref 3.77–5.28)
RDW: 14.6 % (ref 12.3–15.4)
WBC: 7.6 10*3/uL (ref 3.4–10.8)

## 2018-06-13 LAB — BLOOD GAS, ARTERIAL
ACID-BASE DEFICIT: 0.3 mmol/L (ref 0.0–2.0)
BICARBONATE: 24 mmol/L (ref 20.0–28.0)
Drawn by: 535271
FIO2: 21
O2 Saturation: 94.2 %
PATIENT TEMPERATURE: 97.7
pCO2 arterial: 39.7 mmHg (ref 32.0–48.0)
pH, Arterial: 7.396 (ref 7.350–7.450)
pO2, Arterial: 69.4 mmHg — ABNORMAL LOW (ref 83.0–108.0)

## 2018-06-13 LAB — POCT I-STAT 3, VENOUS BLOOD GAS (G3P V)
Acid-base deficit: 2 mmol/L (ref 0.0–2.0)
Bicarbonate: 24 mmol/L (ref 20.0–28.0)
O2 SAT: 70 %
PH VEN: 7.346 (ref 7.250–7.430)
PO2 VEN: 38 mmHg (ref 32.0–45.0)
TCO2: 25 mmol/L (ref 22–32)
pCO2, Ven: 43.9 mmHg — ABNORMAL LOW (ref 44.0–60.0)

## 2018-06-13 LAB — COMPREHENSIVE METABOLIC PANEL
ALT: 33 U/L (ref 0–44)
AST: 29 U/L (ref 15–41)
Albumin: 3.5 g/dL (ref 3.5–5.0)
Alkaline Phosphatase: 61 U/L (ref 38–126)
Anion gap: 6 (ref 5–15)
BILIRUBIN TOTAL: 0.5 mg/dL (ref 0.3–1.2)
BUN: 8 mg/dL (ref 8–23)
CHLORIDE: 109 mmol/L (ref 98–111)
CO2: 24 mmol/L (ref 22–32)
Calcium: 8.3 mg/dL — ABNORMAL LOW (ref 8.9–10.3)
Creatinine, Ser: 0.64 mg/dL (ref 0.44–1.00)
Glucose, Bld: 128 mg/dL — ABNORMAL HIGH (ref 70–99)
POTASSIUM: 3.8 mmol/L (ref 3.5–5.1)
Sodium: 139 mmol/L (ref 135–145)
TOTAL PROTEIN: 6 g/dL — AB (ref 6.5–8.1)

## 2018-06-13 LAB — ECHOCARDIOGRAM COMPLETE
Height: 63 in
Weight: 2560 oz

## 2018-06-13 LAB — PREALBUMIN: Prealbumin: 19.7 mg/dL (ref 18–38)

## 2018-06-13 LAB — HEMOGLOBIN A1C
HEMOGLOBIN A1C: 6.1 % — AB (ref 4.8–5.6)
MEAN PLASMA GLUCOSE: 128.37 mg/dL

## 2018-06-13 LAB — ABO/RH: ABO/RH(D): O POS

## 2018-06-13 LAB — PROTIME-INR
INR: 1.1
PROTHROMBIN TIME: 14.1 s (ref 11.4–15.2)

## 2018-06-13 LAB — APTT: aPTT: 39 seconds — ABNORMAL HIGH (ref 24–36)

## 2018-06-13 SURGERY — RIGHT/LEFT HEART CATH AND CORONARY ANGIOGRAPHY
Anesthesia: LOCAL

## 2018-06-13 MED ORDER — VERAPAMIL HCL 2.5 MG/ML IV SOLN
INTRAVENOUS | Status: DC | PRN
  Administered 2018-06-13: 10 mL via INTRA_ARTERIAL

## 2018-06-13 MED ORDER — LIDOCAINE HCL (PF) 1 % IJ SOLN
INTRAMUSCULAR | Status: DC | PRN
  Administered 2018-06-13 (×2): 2 mL

## 2018-06-13 MED ORDER — POTASSIUM CHLORIDE 2 MEQ/ML IV SOLN
80.0000 meq | INTRAVENOUS | Status: DC
  Filled 2018-06-13: qty 40

## 2018-06-13 MED ORDER — METOPROLOL TARTRATE 12.5 MG HALF TABLET
12.5000 mg | ORAL_TABLET | Freq: Once | ORAL | Status: AC
  Administered 2018-06-14: 12.5 mg via ORAL
  Filled 2018-06-13: qty 1

## 2018-06-13 MED ORDER — LABETALOL HCL 5 MG/ML IV SOLN
INTRAVENOUS | Status: AC
  Filled 2018-06-13: qty 4

## 2018-06-13 MED ORDER — HEPARIN SODIUM (PORCINE) 1000 UNIT/ML IJ SOLN
INTRAMUSCULAR | Status: DC | PRN
  Administered 2018-06-13: 3500 [IU] via INTRAVENOUS

## 2018-06-13 MED ORDER — NITROGLYCERIN 1 MG/10 ML FOR IR/CATH LAB
INTRA_ARTERIAL | Status: AC
  Filled 2018-06-13: qty 10

## 2018-06-13 MED ORDER — HEPARIN (PORCINE) IN NACL 1000-0.9 UT/500ML-% IV SOLN
INTRAVENOUS | Status: AC
  Filled 2018-06-13: qty 1000

## 2018-06-13 MED ORDER — SODIUM CHLORIDE 0.9 % IV SOLN: 250.0000 mL | INTRAVENOUS | Status: DC | PRN

## 2018-06-13 MED ORDER — VERAPAMIL HCL 2.5 MG/ML IV SOLN
INTRAVENOUS | Status: AC
  Filled 2018-06-13: qty 2

## 2018-06-13 MED ORDER — TRANEXAMIC ACID (OHS) BOLUS VIA INFUSION
15.0000 mg/kg | INTRAVENOUS | Status: AC
  Administered 2018-06-14: 1089 mg via INTRAVENOUS
  Filled 2018-06-13: qty 1089

## 2018-06-13 MED ORDER — SODIUM CHLORIDE 0.9% FLUSH: 3.0000 mL | INTRAVENOUS | Status: DC | PRN

## 2018-06-13 MED ORDER — DEXMEDETOMIDINE HCL IN NACL 400 MCG/100ML IV SOLN
0.1000 ug/kg/h | INTRAVENOUS | Status: AC
  Administered 2018-06-14: .4 ug/kg/h via INTRAVENOUS
  Filled 2018-06-13: qty 100

## 2018-06-13 MED ORDER — HEPARIN (PORCINE) IN NACL 1000-0.9 UT/500ML-% IV SOLN
INTRAVENOUS | Status: DC | PRN
  Administered 2018-06-13 (×2): 500 mL

## 2018-06-13 MED ORDER — ASPIRIN 81 MG PO CHEW: 81.0000 mg | CHEWABLE_TABLET | Freq: Every day | ORAL | Status: DC

## 2018-06-13 MED ORDER — SODIUM CHLORIDE 0.9 % IV SOLN
INTRAVENOUS | Status: AC
  Administered 2018-06-13: 16:00:00 via INTRAVENOUS

## 2018-06-13 MED ORDER — NITROGLYCERIN IN D5W 200-5 MCG/ML-% IV SOLN
2.0000 ug/min | INTRAVENOUS | Status: AC
  Administered 2018-06-14: 16.6 ug/min via INTRAVENOUS
  Filled 2018-06-13: qty 250

## 2018-06-13 MED ORDER — ASPIRIN 81 MG PO CHEW: 81.0000 mg | CHEWABLE_TABLET | ORAL | Status: DC

## 2018-06-13 MED ORDER — PHENYLEPHRINE HCL-NACL 20-0.9 MG/250ML-% IV SOLN
30.0000 ug/min | INTRAVENOUS | Status: DC
  Filled 2018-06-13: qty 250

## 2018-06-13 MED ORDER — NITROGLYCERIN 0.4 MG SL SUBL: 0.4000 mg | SUBLINGUAL_TABLET | SUBLINGUAL | Status: DC | PRN

## 2018-06-13 MED ORDER — SODIUM CHLORIDE 0.9 % IV SOLN
1.5000 g | INTRAVENOUS | Status: AC
  Administered 2018-06-14: 1.5 g via INTRAVENOUS
  Filled 2018-06-13: qty 1.5

## 2018-06-13 MED ORDER — BISACODYL 5 MG PO TBEC
5.0000 mg | DELAYED_RELEASE_TABLET | Freq: Once | ORAL | Status: AC
  Administered 2018-06-13: 5 mg via ORAL
  Filled 2018-06-13: qty 1

## 2018-06-13 MED ORDER — DOPAMINE-DEXTROSE 3.2-5 MG/ML-% IV SOLN
0.0000 ug/kg/min | INTRAVENOUS | Status: DC
  Filled 2018-06-13: qty 250

## 2018-06-13 MED ORDER — INSULIN REGULAR(HUMAN) IN NACL 100-0.9 UT/100ML-% IV SOLN
INTRAVENOUS | Status: AC
  Administered 2018-06-14: 1.3 [IU]/h via INTRAVENOUS
  Filled 2018-06-13: qty 100

## 2018-06-13 MED ORDER — SODIUM CHLORIDE 0.9% FLUSH
3.0000 mL | Freq: Two times a day (BID) | INTRAVENOUS | Status: DC
  Administered 2018-06-13: 3 mL via INTRAVENOUS

## 2018-06-13 MED ORDER — LABETALOL HCL 5 MG/ML IV SOLN
INTRAVENOUS | Status: DC | PRN
  Administered 2018-06-13: 10 mg via INTRAVENOUS

## 2018-06-13 MED ORDER — ENOXAPARIN SODIUM 40 MG/0.4ML ~~LOC~~ SOLN: 40.0000 mg | SUBCUTANEOUS | Status: DC

## 2018-06-13 MED ORDER — CHLORHEXIDINE GLUCONATE 0.12 % MT SOLN
15.0000 mL | Freq: Once | OROMUCOSAL | Status: AC
  Administered 2018-06-14: 15 mL via OROMUCOSAL
  Filled 2018-06-13: qty 15

## 2018-06-13 MED ORDER — CHLORHEXIDINE GLUCONATE 4 % EX LIQD
60.0000 mL | Freq: Once | CUTANEOUS | Status: AC
  Administered 2018-06-14: 4 via TOPICAL
  Filled 2018-06-13: qty 60

## 2018-06-13 MED ORDER — ISOSORBIDE MONONITRATE ER 30 MG PO TB24
15.0000 mg | ORAL_TABLET | Freq: Every day | ORAL | Status: DC
  Administered 2018-06-13: 15 mg via ORAL
  Filled 2018-06-13: qty 1

## 2018-06-13 MED ORDER — LIDOCAINE HCL (PF) 1 % IJ SOLN
INTRAMUSCULAR | Status: AC
  Filled 2018-06-13: qty 30

## 2018-06-13 MED ORDER — ACETAMINOPHEN 325 MG PO TABS: 650.0000 mg | ORAL_TABLET | ORAL | Status: DC | PRN

## 2018-06-13 MED ORDER — SODIUM CHLORIDE 0.9 % WEIGHT BASED INFUSION
3.0000 mL/kg/h | INTRAVENOUS | Status: DC
  Administered 2018-06-13: 3 mL/kg/h via INTRAVENOUS

## 2018-06-13 MED ORDER — ROSUVASTATIN CALCIUM 10 MG PO TABS: 20.0000 mg | ORAL_TABLET | Freq: Every day | ORAL | Status: DC

## 2018-06-13 MED ORDER — PLASMA-LYTE 148 IV SOLN
INTRAVENOUS | Status: DC
  Filled 2018-06-13: qty 2.5

## 2018-06-13 MED ORDER — MILRINONE LACTATE IN DEXTROSE 20-5 MG/100ML-% IV SOLN
0.3000 ug/kg/min | INTRAVENOUS | Status: DC
  Filled 2018-06-13: qty 100

## 2018-06-13 MED ORDER — SODIUM CHLORIDE 0.9 % WEIGHT BASED INFUSION
1.0000 mL/kg/h | INTRAVENOUS | Status: DC
  Administered 2018-06-13: 250 mL via INTRAVENOUS

## 2018-06-13 MED ORDER — NOREPINEPHRINE 4 MG/250ML-% IV SOLN
0.0000 ug/min | INTRAVENOUS | Status: DC
  Filled 2018-06-13: qty 250

## 2018-06-13 MED ORDER — CHLORHEXIDINE GLUCONATE 4 % EX LIQD
60.0000 mL | Freq: Once | CUTANEOUS | Status: AC
  Administered 2018-06-13: 4 via TOPICAL
  Filled 2018-06-13: qty 60

## 2018-06-13 MED ORDER — SODIUM CHLORIDE 0.9% FLUSH: 3.0000 mL | Freq: Two times a day (BID) | INTRAVENOUS | Status: DC

## 2018-06-13 MED ORDER — SODIUM CHLORIDE 0.9 % IV SOLN
750.0000 mg | INTRAVENOUS | Status: AC
  Administered 2018-06-14: 750 mg via INTRAVENOUS
  Filled 2018-06-13: qty 750

## 2018-06-13 MED ORDER — CARVEDILOL 3.125 MG PO TABS
3.1250 mg | ORAL_TABLET | Freq: Two times a day (BID) | ORAL | Status: DC
  Administered 2018-06-13: 3.125 mg via ORAL
  Filled 2018-06-13: qty 1

## 2018-06-13 MED ORDER — VANCOMYCIN HCL 10 G IV SOLR
1250.0000 mg | INTRAVENOUS | Status: AC
  Administered 2018-06-14: 1250 mg via INTRAVENOUS
  Filled 2018-06-13: qty 1250

## 2018-06-13 MED ORDER — MIDAZOLAM HCL 2 MG/2ML IJ SOLN
INTRAMUSCULAR | Status: AC
  Filled 2018-06-13: qty 2

## 2018-06-13 MED ORDER — FENTANYL CITRATE (PF) 100 MCG/2ML IJ SOLN
INTRAMUSCULAR | Status: DC | PRN
  Administered 2018-06-13: 50 ug via INTRAVENOUS

## 2018-06-13 MED ORDER — TRANEXAMIC ACID 1000 MG/10ML IV SOLN
1.5000 mg/kg/h | INTRAVENOUS | Status: AC
  Administered 2018-06-14: 1.5 mg/kg/h via INTRAVENOUS
  Filled 2018-06-13: qty 25

## 2018-06-13 MED ORDER — TRANEXAMIC ACID (OHS) PUMP PRIME SOLUTION
2.0000 mg/kg | INTRAVENOUS | Status: DC
  Filled 2018-06-13: qty 1.45

## 2018-06-13 MED ORDER — EPINEPHRINE PF 1 MG/ML IJ SOLN
0.0000 ug/min | INTRAVENOUS | Status: DC
  Filled 2018-06-13: qty 4

## 2018-06-13 MED ORDER — SODIUM CHLORIDE 0.9 % IV SOLN
INTRAVENOUS | Status: DC
  Filled 2018-06-13: qty 30

## 2018-06-13 MED ORDER — MAGNESIUM SULFATE 50 % IJ SOLN
40.0000 meq | INTRAMUSCULAR | Status: DC
  Filled 2018-06-13: qty 9.85

## 2018-06-13 MED ORDER — IOHEXOL 350 MG/ML SOLN
INTRAVENOUS | Status: DC | PRN
  Administered 2018-06-13: 50 mL

## 2018-06-13 MED ORDER — NITROGLYCERIN 1 MG/10 ML FOR IR/CATH LAB
INTRA_ARTERIAL | Status: DC | PRN
  Administered 2018-06-13: 200 ug via INTRACORONARY

## 2018-06-13 MED ORDER — TEMAZEPAM 7.5 MG PO CAPS
15.0000 mg | ORAL_CAPSULE | Freq: Once | ORAL | Status: AC | PRN
  Administered 2018-06-13: 15 mg via ORAL
  Filled 2018-06-13: qty 2

## 2018-06-13 MED ORDER — FENTANYL CITRATE (PF) 100 MCG/2ML IJ SOLN
INTRAMUSCULAR | Status: AC
  Filled 2018-06-13: qty 2

## 2018-06-13 MED ORDER — MIDAZOLAM HCL 2 MG/2ML IJ SOLN
INTRAMUSCULAR | Status: DC | PRN
  Administered 2018-06-13: 1 mg via INTRAVENOUS

## 2018-06-13 MED ORDER — ONDANSETRON HCL 4 MG/2ML IJ SOLN: 4.0000 mg | Freq: Four times a day (QID) | INTRAMUSCULAR | Status: DC | PRN

## 2018-06-13 MED ORDER — VANCOMYCIN HCL 1000 MG IV SOLR
INTRAVENOUS | Status: DC
  Filled 2018-06-13: qty 1000

## 2018-06-13 SURGICAL SUPPLY — 12 items
CATH 5FR JL3.5 JR4 ANG PIG MP (CATHETERS) ×2 IMPLANT
CATH BALLN WEDGE 5F 110CM (CATHETERS) ×2 IMPLANT
DEVICE RAD COMP TR BAND LRG (VASCULAR PRODUCTS) ×2 IMPLANT
GLIDESHEATH SLEND SS 6F .021 (SHEATH) ×2 IMPLANT
GUIDEWIRE INQWIRE 1.5J.035X260 (WIRE) ×2 IMPLANT
INQWIRE 1.5J .035X260CM (WIRE) ×4
KIT HEART LEFT (KITS) ×2 IMPLANT
PACK CARDIAC CATHETERIZATION (CUSTOM PROCEDURE TRAY) ×2 IMPLANT
SHEATH GLIDE SLENDER 4/5FR (SHEATH) ×2 IMPLANT
SYR MEDRAD MARK V 150ML (SYRINGE) ×2 IMPLANT
TRANSDUCER W/STOPCOCK (MISCELLANEOUS) ×2 IMPLANT
TUBING CIL FLEX 10 FLL-RA (TUBING) ×2 IMPLANT

## 2018-06-13 NOTE — Interval H&P Note (Signed)
History and Physical Interval Note:  06/13/2018 2:26 PM  Jaime Chavez  has presented today for cardiac catheterization, with the diagnosis of unstable angina  The various methods of treatment have been discussed with the patient and family. After consideration of risks, benefits and other options for treatment, the patient has consented to  Procedure(s): RIGHT/LEFT HEART CATH AND CORONARY ANGIOGRAPHY (N/A) as a surgical intervention .  The patient's history has been reviewed, patient examined, no change in status, stable for surgery.  I have reviewed the patient's chart and labs.  Questions were answered to the patient's satisfaction.    Cath Lab Visit (complete for each Cath Lab visit)  Clinical Evaluation Leading to the Procedure:   ACS: No.  Non-ACS:    Anginal Classification: CCS IV  Anti-ischemic medical therapy: No Therapy  Non-Invasive Test Results: No non-invasive testing performed  Prior CABG: No previous CABG  [name]

## 2018-06-13 NOTE — Consult Note (Signed)
[address]       Jaime Chavez [address]             [phone]          CARDIOTHORACIC SURGERY CONSULTATION REPORT  PCP is Leland Her, DO Referring Provider is End, Cristal Deer , MD Primary Cardiologist is Parke Poisson, MD  Reason for consultation:  Severe 3-vessel CAD with unstable angina  HPI:  Patient is a 77 year old female with no previous history of coronary artery disease and risk factors notable only for history of hyperlipidemia who has been referred for surgical consultation to discuss treatment options for management of severe multivessel coronary artery disease with unstable angina pectoris.  Patient states that she has been healthy all of her adult life.  She admits to a sedentary lifestyle, and she states that she has been slowing down physically for the last few years.  She complains that she gets tired easily.  A proximally 4 months ago the patient began to experience substernal chest pressure with physical exertion.  Symptoms have increased in frequency and severity over the last several months, and recently she had 2 brief episodes of chest pressure while she was sitting down.  Last night she had some chest pressure which awoke her from her sleep.  Symptoms occasionally radiate to her left arm and beneath her breasts and are associated with shortness of breath.  She described the symptoms to her primary care physician who gave her a prescription for sublingual nitroglycerin and referred for elective cardiology consultation.  Patient was seen yesterday by Dr. Jacques Navy and promptly scheduled for diagnostic cardiac catheterization.  Catheterization performed by Dr. Okey Dupre reveals severe three-vessel coronary artery disease with preserved left ventricular systolic function.  Cardiothoracic surgical consultation was requested.  The patient is married and lives locally in [place] with her husband.  They moved to [place] approximately 2 years ago in  retirement to be near their daughter and grandchildren.  The patient previously worked doing Investment banker, corporate work.  She lives a sedentary lifestyle.  She states that she and her husband try to walk some but she really has not been able to do much for quite some time.  She does not attempt any sort of exercise on a regular basis.  She describes long-standing history of mild exertional shortness of breath which predated the development of substernal chest pressure.  She denies any history of resting shortness of breath, PND, orthopnea, or lower extremity edema.  She has never had any tachypalpitations or syncope.  She reports occasional slight dizzy spells.  Past Medical History:  Diagnosis Date  . Acid reflux   . Coronary artery disease involving native coronary artery of native heart with angina pectoris (HCC) 06/13/2018  . Environmental allergies   . Hyperlipidemia 2014  . Left patella fracture 2005    Past Surgical History:  Procedure Laterality Date  . DILATION AND CURETTAGE, DIAGNOSTIC / THERAPEUTIC     for nonmalignant polyp    Family History  Problem Relation Age of Onset  . Pancreatic cancer Mother   . Heart attack Father        age 53, had 3 heart attacks from blood clots   . Other Father        clotting disorder  . Colon cancer Sister   . Breast cancer Paternal Aunt     Social History   Socioeconomic History  . Marital status: Married    Spouse name: Not on file  .  Number of children: Not on file  . Years of education: high school graduate  . Highest education level: Not on file  Occupational History  . Occupation: retired  Engineer, production  . Financial resource strain: Not on file  . Food insecurity:    Worry: Not on file    Inability: Not on file  . Transportation needs:    Medical: Not on file    Non-medical: Not on file  Tobacco Use  . Smoking status: Never Smoker  . Smokeless tobacco: Never Used  Substance and Sexual Activity  . Alcohol use: Yes    Comment:  wine and liquor, about 1 drink per day  . Drug use: Never  . Sexual activity: Not on file  Lifestyle  . Physical activity:    Days per week: Not on file    Minutes per session: Not on file  . Stress: Not on file  Relationships  . Social connections:    Talks on phone: Not on file    Gets together: Not on file    Attends religious service: Not on file    Active member of club or organization: Not on file    Attends meetings of clubs or organizations: Not on file    Relationship status: Not on file  . Intimate partner violence:    Fear of current or ex partner: Not on file    Emotionally abused: Not on file    Physically abused: Not on file    Forced sexual activity: Not on file  Other Topics Concern  . Not on file  Social History Narrative   Lives with husband Reggie and dog named Hachi.   Religious or personal believes: [employer]   Has an advance directive, would want her husband to make medical decisions for her if she were unable to do so.   Does not exercise regularly.  Does walk sometimes.   For fun she likes to eat out, travel, spent time with family.    Prior to Admission medications   Medication Sig Start Date End Date Taking? Authorizing Provider  acetaminophen (TYLENOL) 325 MG tablet Take 650 mg by mouth every 6 (six) hours as needed.   Yes [provider]  aspirin EC 81 MG tablet Take 1 tablet (81 mg total) by mouth daily. 05/09/18  Yes Mirian Mo, MD  Cetirizine HCl (KLS ALLER-TEC PO) Take 10 mg by mouth daily as needed (allergies).    Yes [provider]  fluticasone (FLONASE) 50 MCG/ACT nasal spray Place 1 spray into both nostrils daily. 1 spray in each nostril every day Patient taking differently: Place 1 spray into both nostrils daily as needed for allergies. 1 spray in each nostril every day as needed 02/25/18  Yes Jeneen Rinks J, DO  nitroGLYCERIN (NITROSTAT) 0.4 MG SL tablet Place 1 tablet (0.4 mg total) under the tongue every 5 (five) minutes as  needed for chest pain. 05/09/18  Yes Mirian Mo, MD  rosuvastatin (CRESTOR) 20 MG tablet Take 1 tablet (20 mg total) by mouth daily. 03/01/18  Yes Leland Her, DO  ranitidine (ZANTAC) 150 MG capsule Take 150 mg by mouth daily as needed for heartburn.     [provider]    Current Facility-Administered Medications  Medication Dose Route Frequency Provider Last Rate Last Dose  . 0.9 %  sodium chloride infusion   Intravenous Continuous End, Christopher, MD 125 mL/hr at 06/13/18 1535    . 0.9 %  sodium chloride infusion  250 mL  Intravenous PRN End, Cristal Deer, MD      . acetaminophen (TYLENOL) tablet 650 mg  650 mg Oral Q4H PRN End, Cristal Deer, MD      . Melene Muller ON 06/14/2018] aspirin chewable tablet 81 mg  81 mg Oral Daily End, Christopher, MD      . carvedilol (COREG) tablet 3.125 mg  3.125 mg Oral BID WC End, Cristal Deer, MD      . Melene Muller ON 06/14/2018] enoxaparin (LOVENOX) injection 40 mg  40 mg Subcutaneous Q24H [name], MD      . isosorbide mononitrate (IMDUR) 24 hr tablet 15 mg  15 mg Oral Daily End, Christopher, MD      . nitroGLYCERIN (NITROSTAT) SL tablet 0.4 mg  0.4 mg Sublingual Q5 min PRN End, Cristal Deer, MD      . ondansetron (ZOFRAN) injection 4 mg  4 mg Intravenous Q6H PRN End, Cristal Deer, MD      . Melene Muller ON 06/14/2018] rosuvastatin (CRESTOR) tablet 20 mg  20 mg Oral Daily End, Christopher, MD      . sodium chloride flush (NS) 0.9 % injection 3 mL  3 mL Intravenous Q12H End, Christopher, MD      . sodium chloride flush (NS) 0.9 % injection 3 mL  3 mL Intravenous PRN End, Cristal Deer, MD        Allergies  Allergen Reactions  . Osteoporosis Support [A-G Pro] Other (See Comments)    Patient that she started having severe pain and back spasms.   . Alendronate Sodium Other (See Comments)    Back pain, muscle spasms      Review of Systems:   General:  normal appetite, decreased energy, no weight gain, no weight loss, no fever  Cardiac:  + chest pain  with exertion, 3 episodes chest pain at rest, + SOB with exertion, no resting SOB, no PND, no orthopnea, no palpitations, no arrhythmia, no atrial fibrillation, no LE edema, occasional dizzy spells, no syncope  Respiratory:  + exertional shortness of breath, no home oxygen, no productive cough, + dry cough, no bronchitis, no wheezing, no hemoptysis, no asthma, no pain with inspiration or cough, no sleep apnea, no CPAP at night  GI:   Occasional mild difficulty swallowing, + reflux, no frequent heartburn, no hiatal hernia, no abdominal pain, + constipation, no diarrhea, + occasional hematochezia related to hemorrhoids, no hematemesis, no melena  GU:   no dysuria,  + frequency, no urinary tract infection, no hematuria, no kidney stones, no kidney disease  Vascular:  no pain suggestive of claudication, no pain in feet, no leg cramps, no varicose veins, no DVT, no non-healing foot ulcer  Neuro:   no stroke, no TIA's, no seizures, no headaches, no temporary blindness one eye,  no slurred speech, no peripheral neuropathy, no chronic pain, mild instability of gait, no memory/cognitive dysfunction  Musculoskeletal: + arthritis - primarily involving the hips, no joint swelling, + myalgias, minor difficulty walking, close to normal mobility   Skin:   no rash, no itching, no skin infections, no pressure sores or ulcerations  Psych:   no anxiety, no depression, no nervousness, no unusual recent stress  Eyes:   no blurry vision, no floaters, no recent vision changes, + wears glasses or contacts  ENT:   no hearing loss, no loose or painful teeth, no dentures, last saw dentist recently  Hematologic:  no easy bruising, no abnormal bleeding, no clotting disorder, no frequent epistaxis  Endocrine:  no diabetes, does not check CBG's at home  Physical Exam:   BP (!) 145/65 (BP Location: Left Arm)   Pulse 67   Temp 97.7 F (36.5 C) (Oral)   Resp 16   Ht 5\' 3"  (1.6 m)   Wt 72.6 kg   SpO2 98%   BMI 28.34 kg/m     General:  Mildly obese,  well-appearing  HEENT:  Unremarkable   Neck:   no JVD, no bruits, no adenopathy   Chest:   clear to auscultation, symmetrical breath sounds, no wheezes, no rhonchi   CV:   RRR, no  murmur   Abdomen:  soft, non-tender, no masses   Extremities:  warm, well-perfused, pulses diminished, no lower extremity edema  Rectal/GU  Deferred  Neuro:   Grossly non-focal and symmetrical throughout  Skin:   Clean and dry, no rashes, no breakdown  Diagnostic Tests:  Lab Results: Recent Labs    06/12/18 1504  WBC 7.6  HGB 14.5  HCT 43.5  PLT 238   BMET:  Recent Labs    06/12/18 1504  NA 139  K 4.3  CL 104  CO2 26  GLUCOSE 98  BUN 13  CREATININE 0.64  CALCIUM 9.1    CBG (last 3)  No results for input(s): GLUCAP in the last 72 hours. PT/INR:  No results for input(s): LABPROT, INR in the last 72 hours.  CXR:  N/A   RIGHT/LEFT HEART CATH AND CORONARY ANGIOGRAPHY  Conclusion   Conclusions: 1. Severe three-vessel coronary artery disease, as outlined below. 2. Hyperdynamic left ventricular contraction. 3. Low normal left and right heart filling pressures. 4. Normal Fick cardiac output/index.  Recommendations: 1. Given chest pain with minimal activity and intermittently at rest, I will admit Jaime Chavez for expedited cardiac surgery consultation for CABG. 2. Begin carvedilol and isosorbide mononitrate for antianginal therapy. 3. Continue indefinite aspirin 81 mg daily. 4. Obtain transthoracic echocardiogram. 5. Aggressive secondary prevention, including high-intensity statin therapy.  [name], MD [facility] Pager: [phone]   Indications   Unstable angina (HCC) [I20.0 (ICD-10-CM)]  Procedural Details/Technique   Technical Details Indication: 77 y.o. year-old woman with history of GERD, hyperlipidemia, and environmental allergies, presenting for evaluation of chest pain and shortness of breath. Symptoms began ~4 months ago and have  worsened. She not reports chest pain and pressure with minimal activity. Pain has even been present at rest on a few occasions, most recently last night. Given concern for unstable angina, she has been referred for right and left heart catheterization and possible PCI.  GFR: 88 ml/min  Procedure: The risks, benefits, complications, treatment options, and expected outcomes were discussed with the patient. The patient and/or family concurred with the proposed plan, giving informed consent. The patient was brought to the cath lab after IV hydration was begun and oral premedication was given. The patient was further sedated with Versed and Fentanyl. The right wrist was assessed with a modified Allens test which was normal. The right wrist and elbow were prepped and draped in a sterile fashion. 1% lidocaine was used for local anesthesia. A previously placed antecubital vein IV was exchanged for a 768F slender Glidesheath using modified Seldinger technique. Right heart catheterization was performed by advancing a 768F balloon-tipped catheter through the right heart chambers into the pulmonary capillary wedge position. Pressure measurements and oxygen saturations were obtained.  Using the modified Seldinger access technique, a 29F slender Glidesheath was placed in the right radial artery. 3 mg Verapamil was given through the sheath. Heparin 3,500 units were administered. Selective  coronary angiography was performed using 42F JL3.5 and JR4 catheters to engage the left and right coronary arteries, respectively. Left heart catheterization was performed using a 42F pigtail catheter. Left ventriculogram was performed with a power injection of contrast.  At the end of the procedure, the radial artery sheath was removed and a TR band applied to achieve patent hemostasis. There were no immediate complications. The patient was taken to the recovery area in stable condition.  Contrast used: 50 mL Isovue Fluoroscopy time: 5.0  min Radiation dose: 579 mGy   Estimated blood loss <50 mL.  During this procedure the patient was administered the following to achieve and maintain moderate conscious sedation: Versed 1 mg, Fentanyl 50 mcg, while the patient's heart rate, blood pressure, and oxygen saturation were continuously monitored. The period of conscious sedation was 29 minutes, of which I was present face-to-face 100% of this time.  Complications   Complications documented before study signed (06/13/2018 3:37 PM EDT)    No complications were associated with this study.  Documented by [name], MD - 06/13/2018 3:30 PM EDT    Coronary Findings   Diagnostic  Dominance: Right  Left Main  Vessel is moderate in size.  Left Anterior Descending  Vessel is moderate in size.  Prox LAD to Mid LAD lesion 70% stenosed  Prox LAD to Mid LAD lesion is 70% stenosed.  Mid LAD lesion 90% stenosed  Mid LAD lesion is 90% stenosed.  Dist LAD lesion 50% stenosed  Dist LAD lesion is 50% stenosed.  First Diagonal Branch  Vessel is small in size.  Second Diagonal Branch  Vessel is small in size.  Third Diagonal Branch  Vessel is small in size.  Left Circumflex  Vessel is moderate in size.  Prox Cx lesion 80% stenosed  Prox Cx lesion is 80% stenosed.  First Obtuse Marginal Branch  Vessel is small in size.  Second Obtuse Marginal Branch  Vessel is moderate in size.  Right Coronary Artery  Vessel is moderate in size.  Prox RCA lesion 80% stenosed  Prox RCA lesion is 80% stenosed.  Mid RCA-1 lesion 90% stenosed  Mid RCA-1 lesion is 90% stenosed.  Mid RCA-2 lesion 60% stenosed  Mid RCA-2 lesion is 60% stenosed.  Dist RCA lesion 40% stenosed  Dist RCA lesion is 40% stenosed.  Right Posterior Descending Artery  Vessel is moderate in size.  Ost RPDA to RPDA lesion 70% stenosed  Ost RPDA to RPDA lesion is 70% stenosed.  RPDA lesion 90% stenosed  RPDA lesion is 90% stenosed.  Right Posterior Atrioventricular  Branch  Vessel is moderate in size.  Intervention   No interventions have been documented.  Right Heart   Right Heart Pressures RA (mean): 4 mmHg RV (S/EDP): 34/6 mmHg PA (S/D, mean): 29/9 (17) mmHg PCWP (mean): 7 mmHg  Ao sat: 95% PA sat: 70%  Fick CO: 4.8 L/min Fick CI: 2.7 L/min/m^2  Wall Motion   Resting               Left Heart   Left Ventricle The left ventricular size is normal. There is hyperdynamic left ventricular systolic function. LV end diastolic pressure is low. The left ventricular ejection fraction is greater than 65% by visual estimate. No regional wall motion abnormalities. There is no evidence of mitral regurgitation.  Aortic Valve There is no aortic valve stenosis.  Coronary Diagrams   Diagnostic Diagram           Impression:  Patient presents with  a 46-month history of progressive symptoms of classical angina pectoris with a total of 3 recent episodes of chest pressure occurring at rest, all of which were short-lived and the most recent relieved using sublingual nitroglycerin.  I have personally reviewed the patient's diagnostic cardiac catheterization.  She has severe three-vessel coronary artery disease with preserved left ventricular systolic function.  The patient has high-grade proximal stenosis in all vascular territories.  Distal target vessels are relatively small in the left anterior descending coronary artery may be intramyocardial during the majority of its course.  However, I agree the patient would best be treated with surgical revascularization.  Plan:  I have reviewed the indications, risks, and potential benefits of coronary artery bypass grafting with the patient and her husband at the bedside.  Alternative treatment strategies have been discussed, including the relative risks, benefits and long term prognosis associated with medical therapy, percutaneous coronary intervention, and surgical revascularization.  The patient understands  and accepts all potential associated risks of surgery including but not limited to risk of death, stroke or other neurologic complication, myocardial infarction, congestive heart failure, respiratory failure, renal failure, bleeding requiring blood transfusion and/or reexploration, aortic dissection or other major vascular complication, arrhythmia, heart block or bradycardia requiring permanent pacemaker, pneumonia, pleural effusion, wound infection, pulmonary embolus or other thromboembolic complication, chronic pain or other delayed complications related to median sternotomy, or the late recurrence of symptomatic ischemic heart disease and/or congestive heart failure.  The importance of long term risk modification have been emphasized.  All questions answered.  We plan to proceed with coronary artery bypass grafting tomorrow morning.   I spent in excess of 90 minutes during the conduct of this hospital consultation and >50% of this time involved direct face-to-face encounter for counseling and/or coordination of the patient's care.    Salvatore Decent. Cornelius Moras, MD 06/13/2018 4:20 PM

## 2018-06-13 NOTE — Progress Notes (Signed)
  Echocardiogram 2D Echocardiogram has been performed.  Roosvelt Maser F 06/13/2018, 4:53 PM

## 2018-06-13 NOTE — Progress Notes (Signed)
Pre-op Cardiac Surgery  Carotid Findings:   Right Carotid:Velocities in the right ICA are consistent with a 40-59% stenosis.  Left Carotid: Velocities in the left ICA are consistent with a 1-39% stenosis.   Vertebrals:Bilateral vertebral arteries demonstrate antegrade flow.    Upper Extremity Right Left  Brachial Pressures 115 120  Radial Waveforms Triphasic Triphasic  Ulnar Waveforms Triphasic Triphasic  Palmar Arch (Allen's Test) See below  See below   Findings:   Right upper extremity: Unable to obtain Allen's test due to fresh TR band on site. Left Upper Extremity: Doppler waveform obliterate with left radial compression. Doppler waveform obliterate with left ulnar compression.   Lower  Extremity Right Left  Dorsalis Pedis 141 152  Posterior Tibial 103 158  Ankle/Brachial Indices 1.18 1.32   Findings:   Right ABI: Resting right ankle-brachial index is within normal range. No evidence of significant right lower extremity arterial disease.  Left ABI: Resting left ankle-brachial index indicates noncompressible left lower extremity arteries.  Hongying Richardson Dopp (RDMS RVT) 06/13/18 7:14 PM

## 2018-06-14 ENCOUNTER — Encounter (HOSPITAL_COMMUNITY): Payer: Self-pay | Admitting: Internal Medicine

## 2018-06-14 ENCOUNTER — Encounter (HOSPITAL_COMMUNITY)
Admission: RE | Disposition: A | Discharge status: Home / Self Care | Payer: Self-pay | Source: Home / Self Care | Attending: Thoracic Surgery (Cardiothoracic Vascular Surgery)

## 2018-06-14 ENCOUNTER — Inpatient Hospital Stay (HOSPITAL_COMMUNITY): Payer: Medicare HMO | Admitting: Certified Registered Nurse Anesthetist

## 2018-06-14 ENCOUNTER — Inpatient Hospital Stay (HOSPITAL_COMMUNITY): Payer: Medicare HMO

## 2018-06-14 DIAGNOSIS — I208 Other forms of angina pectoris: Secondary | ICD-10-CM

## 2018-06-14 DIAGNOSIS — Z951 Presence of aortocoronary bypass graft: Secondary | ICD-10-CM

## 2018-06-14 HISTORY — DX: Presence of aortocoronary bypass graft: Z95.1

## 2018-06-14 HISTORY — PX: CORONARY ARTERY BYPASS GRAFT: SHX141

## 2018-06-14 HISTORY — PX: TEE WITHOUT CARDIOVERSION: SHX5443

## 2018-06-14 LAB — CREATININE, SERUM: Creatinine, Ser: 0.63 mg/dL (ref 0.44–1.00)

## 2018-06-14 LAB — POCT I-STAT, CHEM 8
BUN: 7 mg/dL — ABNORMAL LOW (ref 8–23)
BUN: 5 mg/dL — AB (ref 8–23)
BUN: 6 mg/dL — ABNORMAL LOW (ref 8–23)
BUN: 6 mg/dL — ABNORMAL LOW (ref 8–23)
BUN: 6 mg/dL — AB (ref 8–23)
BUN: 5 mg/dL — AB (ref 8–23)
BUN: 6 mg/dL — ABNORMAL LOW (ref 8–23)
CALCIUM ION: 1.12 mmol/L — AB (ref 1.15–1.40)
CALCIUM ION: 1.11 mmol/L — AB (ref 1.15–1.40)
CALCIUM ION: 1.13 mmol/L — AB (ref 1.15–1.40)
CALCIUM ION: 1.03 mmol/L — AB (ref 1.15–1.40)
CHLORIDE: 107 mmol/L (ref 98–111)
CHLORIDE: 104 mmol/L (ref 98–111)
CHLORIDE: 107 mmol/L (ref 98–111)
CREATININE: 0.3 mg/dL — AB (ref 0.44–1.00)
CREATININE: 0.3 mg/dL — AB (ref 0.44–1.00)
Calcium, Ion: 0.85 mmol/L — CL (ref 1.15–1.40)
Calcium, Ion: 0.93 mmol/L — ABNORMAL LOW (ref 1.15–1.40)
Calcium, Ion: 1.02 mmol/L — ABNORMAL LOW (ref 1.15–1.40)
Chloride: 107 mmol/L (ref 98–111)
Chloride: 106 mmol/L (ref 98–111)
Chloride: 98 mmol/L (ref 98–111)
Chloride: 111 mmol/L (ref 98–111)
Creatinine, Ser: 0.5 mg/dL (ref 0.44–1.00)
Creatinine, Ser: 0.4 mg/dL — ABNORMAL LOW (ref 0.44–1.00)
Creatinine, Ser: 0.4 mg/dL — ABNORMAL LOW (ref 0.44–1.00)
Creatinine, Ser: 0.3 mg/dL — ABNORMAL LOW (ref 0.44–1.00)
Creatinine, Ser: 0.4 mg/dL — ABNORMAL LOW (ref 0.44–1.00)
GLUCOSE: 124 mg/dL — AB (ref 70–99)
GLUCOSE: 159 mg/dL — AB (ref 70–99)
GLUCOSE: 142 mg/dL — AB (ref 70–99)
Glucose, Bld: 129 mg/dL — ABNORMAL HIGH (ref 70–99)
Glucose, Bld: 159 mg/dL — ABNORMAL HIGH (ref 70–99)
Glucose, Bld: 162 mg/dL — ABNORMAL HIGH (ref 70–99)
Glucose, Bld: 127 mg/dL — ABNORMAL HIGH (ref 70–99)
HCT: 35 % — ABNORMAL LOW (ref 36.0–46.0)
HCT: 32 % — ABNORMAL LOW (ref 36.0–46.0)
HCT: 33 % — ABNORMAL LOW (ref 36.0–46.0)
HCT: 29 % — ABNORMAL LOW (ref 36.0–46.0)
HEMATOCRIT: 23 % — AB (ref 36.0–46.0)
HEMATOCRIT: 24 % — AB (ref 36.0–46.0)
HEMATOCRIT: 22 % — AB (ref 36.0–46.0)
HEMOGLOBIN: 11.9 g/dL — AB (ref 12.0–15.0)
HEMOGLOBIN: 10.9 g/dL — AB (ref 12.0–15.0)
HEMOGLOBIN: 11.2 g/dL — AB (ref 12.0–15.0)
HEMOGLOBIN: 7.8 g/dL — AB (ref 12.0–15.0)
HEMOGLOBIN: 7.5 g/dL — AB (ref 12.0–15.0)
Hemoglobin: 8.2 g/dL — ABNORMAL LOW (ref 12.0–15.0)
Hemoglobin: 9.9 g/dL — ABNORMAL LOW (ref 12.0–15.0)
POTASSIUM: 3.7 mmol/L (ref 3.5–5.1)
POTASSIUM: 3.7 mmol/L (ref 3.5–5.1)
POTASSIUM: 4.4 mmol/L (ref 3.5–5.1)
Potassium: 3.6 mmol/L (ref 3.5–5.1)
Potassium: 3.6 mmol/L (ref 3.5–5.1)
Potassium: 3.7 mmol/L (ref 3.5–5.1)
Potassium: 4.7 mmol/L (ref 3.5–5.1)
SODIUM: 138 mmol/L (ref 135–145)
SODIUM: 141 mmol/L (ref 135–145)
SODIUM: 139 mmol/L (ref 135–145)
Sodium: 140 mmol/L (ref 135–145)
Sodium: 140 mmol/L (ref 135–145)
Sodium: 141 mmol/L (ref 135–145)
Sodium: 141 mmol/L (ref 135–145)
TCO2: 25 mmol/L (ref 22–32)
TCO2: 23 mmol/L (ref 22–32)
TCO2: 24 mmol/L (ref 22–32)
TCO2: 25 mmol/L (ref 22–32)
TCO2: 23 mmol/L (ref 22–32)
TCO2: 23 mmol/L (ref 22–32)
TCO2: 20 mmol/L — ABNORMAL LOW (ref 22–32)

## 2018-06-14 LAB — CBC
HCT: 38.9 % (ref 36.0–46.0)
HCT: 31.2 % — ABNORMAL LOW (ref 36.0–46.0)
HEMATOCRIT: 27.7 % — AB (ref 36.0–46.0)
HEMOGLOBIN: 8.8 g/dL — AB (ref 12.0–15.0)
Hemoglobin: 12.1 g/dL (ref 12.0–15.0)
Hemoglobin: 9.9 g/dL — ABNORMAL LOW (ref 12.0–15.0)
MCH: 28.5 pg (ref 26.0–34.0)
MCH: 28.9 pg (ref 26.0–34.0)
MCH: 27.4 pg (ref 26.0–34.0)
MCHC: 31.1 g/dL (ref 30.0–36.0)
MCHC: 31.8 g/dL (ref 30.0–36.0)
MCHC: 31.7 g/dL (ref 30.0–36.0)
MCV: 91.7 fL (ref 80.0–100.0)
MCV: 91.1 fL (ref 80.0–100.0)
MCV: 86.4 fL (ref 80.0–100.0)
NRBC: 0 % (ref 0.0–0.2)
NRBC: 0 % (ref 0.0–0.2)
NRBC: 0 % (ref 0.0–0.2)
PLATELETS: 236 10*3/uL (ref 150–400)
Platelets: 174 10*3/uL (ref 150–400)
Platelets: 145 10*3/uL — ABNORMAL LOW (ref 150–400)
RBC: 4.24 MIL/uL (ref 3.87–5.11)
RBC: 3.04 MIL/uL — ABNORMAL LOW (ref 3.87–5.11)
RBC: 3.61 MIL/uL — AB (ref 3.87–5.11)
RDW: 13.9 % (ref 11.5–15.5)
RDW: 13.7 % (ref 11.5–15.5)
RDW: 16.1 % — ABNORMAL HIGH (ref 11.5–15.5)
WBC: 7.1 10*3/uL (ref 4.0–10.5)
WBC: 10.8 10*3/uL — AB (ref 4.0–10.5)
WBC: 11.8 10*3/uL — ABNORMAL HIGH (ref 4.0–10.5)

## 2018-06-14 LAB — GLUCOSE, CAPILLARY
GLUCOSE-CAPILLARY: 99 mg/dL (ref 70–99)
GLUCOSE-CAPILLARY: 94 mg/dL (ref 70–99)
GLUCOSE-CAPILLARY: 116 mg/dL — AB (ref 70–99)
GLUCOSE-CAPILLARY: 119 mg/dL — AB (ref 70–99)
GLUCOSE-CAPILLARY: 135 mg/dL — AB (ref 70–99)
GLUCOSE-CAPILLARY: 65 mg/dL — AB (ref 70–99)
Glucose-Capillary: 98 mg/dL (ref 70–99)
Glucose-Capillary: 114 mg/dL — ABNORMAL HIGH (ref 70–99)
Glucose-Capillary: 112 mg/dL — ABNORMAL HIGH (ref 70–99)

## 2018-06-14 LAB — POCT I-STAT 4, (NA,K, GLUC, HGB,HCT)
Glucose, Bld: 112 mg/dL — ABNORMAL HIGH (ref 70–99)
HCT: 24 % — ABNORMAL LOW (ref 36.0–46.0)
Hemoglobin: 8.2 g/dL — ABNORMAL LOW (ref 12.0–15.0)
Potassium: 3.6 mmol/L (ref 3.5–5.1)
Sodium: 143 mmol/L (ref 135–145)

## 2018-06-14 LAB — BASIC METABOLIC PANEL
ANION GAP: 6 (ref 5–15)
BUN: 8 mg/dL (ref 8–23)
CALCIUM: 8.3 mg/dL — AB (ref 8.9–10.3)
CO2: 24 mmol/L (ref 22–32)
Chloride: 110 mmol/L (ref 98–111)
Creatinine, Ser: 0.65 mg/dL (ref 0.44–1.00)
GLUCOSE: 109 mg/dL — AB (ref 70–99)
Potassium: 3.7 mmol/L (ref 3.5–5.1)
SODIUM: 140 mmol/L (ref 135–145)

## 2018-06-14 LAB — POCT I-STAT 3, ART BLOOD GAS (G3+)
ACID-BASE DEFICIT: 1 mmol/L (ref 0.0–2.0)
Acid-base deficit: 3 mmol/L — ABNORMAL HIGH (ref 0.0–2.0)
Acid-base deficit: 5 mmol/L — ABNORMAL HIGH (ref 0.0–2.0)
Acid-base deficit: 6 mmol/L — ABNORMAL HIGH (ref 0.0–2.0)
BICARBONATE: 23.7 mmol/L (ref 20.0–28.0)
BICARBONATE: 20.6 mmol/L (ref 20.0–28.0)
Bicarbonate: 20 mmol/L (ref 20.0–28.0)
Bicarbonate: 19 mmol/L — ABNORMAL LOW (ref 20.0–28.0)
O2 Saturation: 100 %
O2 Saturation: 100 %
O2 Saturation: 99 %
O2 Saturation: 99 %
PCO2 ART: 39.7 mmHg (ref 32.0–48.0)
PCO2 ART: 29.7 mmHg — AB (ref 32.0–48.0)
PCO2 ART: 35.9 mmHg (ref 32.0–48.0)
PH ART: 7.444 (ref 7.350–7.450)
PH ART: 7.333 — AB (ref 7.350–7.450)
PO2 ART: 364 mmHg — AB (ref 83.0–108.0)
PO2 ART: 142 mmHg — AB (ref 83.0–108.0)
Patient temperature: 35.5
Patient temperature: 37.3
TCO2: 25 mmol/L (ref 22–32)
TCO2: 22 mmol/L (ref 22–32)
TCO2: 21 mmol/L — AB (ref 22–32)
TCO2: 20 mmol/L — ABNORMAL LOW (ref 22–32)
pCO2 arterial: 37.9 mmHg (ref 32.0–48.0)
pH, Arterial: 7.385 (ref 7.350–7.450)
pH, Arterial: 7.331 — ABNORMAL LOW (ref 7.350–7.450)
pO2, Arterial: 168 mmHg — ABNORMAL HIGH (ref 83.0–108.0)
pO2, Arterial: 129 mmHg — ABNORMAL HIGH (ref 83.0–108.0)

## 2018-06-14 LAB — SURGICAL PCR SCREEN
MRSA, PCR: NEGATIVE
Staphylococcus aureus: NEGATIVE

## 2018-06-14 LAB — PREPARE RBC (CROSSMATCH)

## 2018-06-14 LAB — PLATELET COUNT: Platelets: 183 10*3/uL (ref 150–400)

## 2018-06-14 LAB — HEMOGLOBIN AND HEMATOCRIT, BLOOD
HCT: 23.7 % — ABNORMAL LOW (ref 36.0–46.0)
Hemoglobin: 7.8 g/dL — ABNORMAL LOW (ref 12.0–15.0)

## 2018-06-14 LAB — MAGNESIUM: Magnesium: 3.3 mg/dL — ABNORMAL HIGH (ref 1.7–2.4)

## 2018-06-14 LAB — APTT: aPTT: 39 seconds — ABNORMAL HIGH (ref 24–36)

## 2018-06-14 LAB — PROTIME-INR
INR: 1.54
Prothrombin Time: 18.3 seconds — ABNORMAL HIGH (ref 11.4–15.2)

## 2018-06-14 SURGERY — CORONARY ARTERY BYPASS GRAFTING (CABG)
Anesthesia: General | Site: Chest

## 2018-06-14 MED ORDER — ACETAMINOPHEN 160 MG/5ML PO SOLN: 1000.0000 mg | Freq: Four times a day (QID) | ORAL | Status: DC

## 2018-06-14 MED ORDER — PLASMA-LYTE 148 IV SOLN
INTRAVENOUS | Status: DC | PRN
  Administered 2018-06-14: 09:00:00 via INTRAVASCULAR

## 2018-06-14 MED ORDER — CALCIUM CHLORIDE 10 % IV SOLN
INTRAVENOUS | Status: DC | PRN
  Administered 2018-06-14: 100 mg via INTRAVENOUS
  Administered 2018-06-14 (×2): 200 mg via INTRAVENOUS
  Administered 2018-06-14: 100 mg via INTRAVENOUS

## 2018-06-14 MED ORDER — MORPHINE SULFATE (PF) 2 MG/ML IV SOLN
1.0000 mg | INTRAVENOUS | Status: DC | PRN
  Administered 2018-06-14 (×2): 4 mg via INTRAVENOUS
  Administered 2018-06-14: 2 mg via INTRAVENOUS
  Administered 2018-06-14: 4 mg via INTRAVENOUS
  Filled 2018-06-14 (×4): qty 2

## 2018-06-14 MED ORDER — POTASSIUM CHLORIDE 10 MEQ/50ML IV SOLN
10.0000 meq | INTRAVENOUS | Status: AC
  Administered 2018-06-14 (×3): 10 meq via INTRAVENOUS

## 2018-06-14 MED ORDER — LACTATED RINGERS IV SOLN
INTRAVENOUS | Status: DC | PRN
  Administered 2018-06-14 (×2): via INTRAVENOUS

## 2018-06-14 MED ORDER — NITROGLYCERIN IN D5W 200-5 MCG/ML-% IV SOLN
0.0000 ug/min | INTRAVENOUS | Status: DC
  Administered 2018-06-15: 5 ug/min via INTRAVENOUS
  Filled 2018-06-14: qty 250

## 2018-06-14 MED ORDER — SODIUM CHLORIDE 0.9 % IV SOLN
INTRAVENOUS | Status: DC | PRN
  Administered 2018-06-14: 25 ug/min via INTRAVENOUS

## 2018-06-14 MED ORDER — MAGNESIUM SULFATE 4 GM/100ML IV SOLN
4.0000 g | Freq: Once | INTRAVENOUS | Status: AC
  Administered 2018-06-14: 4 g via INTRAVENOUS
  Filled 2018-06-14: qty 100

## 2018-06-14 MED ORDER — LACTATED RINGERS IV SOLN
INTRAVENOUS | Status: DC | PRN
  Administered 2018-06-14: 07:00:00 via INTRAVENOUS

## 2018-06-14 MED ORDER — PROPOFOL 10 MG/ML IV BOLUS
INTRAVENOUS | Status: AC
  Filled 2018-06-14: qty 20

## 2018-06-14 MED ORDER — SODIUM CHLORIDE 0.45 % IV SOLN: INTRAVENOUS | Status: DC | PRN

## 2018-06-14 MED ORDER — INSULIN REGULAR(HUMAN) IN NACL 100-0.9 UT/100ML-% IV SOLN: INTRAVENOUS | Status: DC

## 2018-06-14 MED ORDER — SODIUM CHLORIDE 0.9 % IV SOLN
INTRAVENOUS | Status: DC
  Administered 2018-06-15: 12:00:00 via INTRAVENOUS

## 2018-06-14 MED ORDER — LACTATED RINGERS IV SOLN: INTRAVENOUS | Status: DC

## 2018-06-14 MED ORDER — ONDANSETRON HCL 4 MG/2ML IJ SOLN
INTRAMUSCULAR | Status: AC
  Filled 2018-06-14: qty 2

## 2018-06-14 MED ORDER — PROPOFOL 10 MG/ML IV BOLUS
INTRAVENOUS | Status: DC | PRN
  Administered 2018-06-14: 100 mg via INTRAVENOUS
  Administered 2018-06-14 (×2): 50 mg via INTRAVENOUS

## 2018-06-14 MED ORDER — SODIUM CHLORIDE 0.9% FLUSH: 3.0000 mL | INTRAVENOUS | Status: DC | PRN

## 2018-06-14 MED ORDER — SODIUM CHLORIDE 0.9 % IV SOLN
1.5000 g | Freq: Two times a day (BID) | INTRAVENOUS | Status: AC
  Administered 2018-06-14 – 2018-06-16 (×4): 1.5 g via INTRAVENOUS
  Filled 2018-06-14 (×4): qty 1.5

## 2018-06-14 MED ORDER — ASPIRIN EC 325 MG PO TBEC
325.0000 mg | DELAYED_RELEASE_TABLET | Freq: Every day | ORAL | Status: DC
  Administered 2018-06-16: 325 mg via ORAL
  Filled 2018-06-14: qty 1

## 2018-06-14 MED ORDER — ACETAMINOPHEN 160 MG/5ML PO SOLN: 650.0000 mg | Freq: Once | ORAL | Status: AC

## 2018-06-14 MED ORDER — MIDAZOLAM HCL 2 MG/2ML IJ SOLN: 2.0000 mg | INTRAMUSCULAR | Status: DC | PRN

## 2018-06-14 MED ORDER — CHLORHEXIDINE GLUCONATE 0.12 % MT SOLN
15.0000 mL | OROMUCOSAL | Status: AC
  Administered 2018-06-14: 15 mL via OROMUCOSAL

## 2018-06-14 MED ORDER — ALBUMIN HUMAN 5 % IV SOLN
INTRAVENOUS | Status: DC | PRN
  Administered 2018-06-14 (×2): via INTRAVENOUS

## 2018-06-14 MED ORDER — PROTAMINE SULFATE 10 MG/ML IV SOLN
INTRAVENOUS | Status: DC | PRN
  Administered 2018-06-14: 200 mg via INTRAVENOUS

## 2018-06-14 MED ORDER — FAMOTIDINE IN NACL 20-0.9 MG/50ML-% IV SOLN
20.0000 mg | Freq: Two times a day (BID) | INTRAVENOUS | Status: DC
  Administered 2018-06-14: 20 mg via INTRAVENOUS
  Filled 2018-06-14: qty 50

## 2018-06-14 MED ORDER — SODIUM CHLORIDE 0.9 % IV SOLN
250.0000 mL | INTRAVENOUS | Status: DC
  Administered 2018-06-15: 250 mL via INTRAVENOUS

## 2018-06-14 MED ORDER — ALBUMIN HUMAN 5 % IV SOLN
250.0000 mL | INTRAVENOUS | Status: AC | PRN
  Filled 2018-06-14: qty 500

## 2018-06-14 MED ORDER — FENTANYL CITRATE (PF) 250 MCG/5ML IJ SOLN
INTRAMUSCULAR | Status: DC | PRN
  Administered 2018-06-14: 50 ug via INTRAVENOUS
  Administered 2018-06-14 (×2): 150 ug via INTRAVENOUS
  Administered 2018-06-14: 50 ug via INTRAVENOUS
  Administered 2018-06-14 (×3): 100 ug via INTRAVENOUS
  Administered 2018-06-14: 200 ug via INTRAVENOUS

## 2018-06-14 MED ORDER — SODIUM CHLORIDE 0.9% FLUSH
3.0000 mL | Freq: Two times a day (BID) | INTRAVENOUS | Status: DC
  Administered 2018-06-15 – 2018-06-18 (×6): 3 mL via INTRAVENOUS

## 2018-06-14 MED ORDER — MORPHINE SULFATE (PF) 2 MG/ML IV SOLN
1.0000 mg | INTRAVENOUS | Status: AC | PRN
  Administered 2018-06-15: 2 mg via INTRAVENOUS
  Filled 2018-06-14: qty 1

## 2018-06-14 MED ORDER — ACETAMINOPHEN 500 MG PO TABS
1000.0000 mg | ORAL_TABLET | Freq: Four times a day (QID) | ORAL | Status: DC
  Administered 2018-06-14 – 2018-06-18 (×10): 1000 mg via ORAL
  Filled 2018-06-14 (×12): qty 2

## 2018-06-14 MED ORDER — MIDAZOLAM HCL 10 MG/2ML IJ SOLN
INTRAMUSCULAR | Status: AC
  Filled 2018-06-14: qty 2

## 2018-06-14 MED ORDER — VASOPRESSIN 20 UNIT/ML IV SOLN
INTRAVENOUS | Status: AC
  Filled 2018-06-14: qty 2

## 2018-06-14 MED ORDER — OXYCODONE HCL 5 MG PO TABS
5.0000 mg | ORAL_TABLET | ORAL | Status: DC | PRN
  Administered 2018-06-14: 5 mg via ORAL
  Administered 2018-06-15 – 2018-06-18 (×4): 10 mg via ORAL
  Filled 2018-06-14 (×2): qty 2
  Filled 2018-06-14: qty 1
  Filled 2018-06-14 (×2): qty 2

## 2018-06-14 MED ORDER — PHENYLEPHRINE HCL-NACL 20-0.9 MG/250ML-% IV SOLN
0.0000 ug/min | INTRAVENOUS | Status: DC
  Filled 2018-06-14: qty 250

## 2018-06-14 MED ORDER — CALCIUM CHLORIDE 10 % IV SOLN
INTRAVENOUS | Status: AC
  Filled 2018-06-14: qty 10

## 2018-06-14 MED ORDER — BISACODYL 10 MG RE SUPP: 10.0000 mg | Freq: Every day | RECTAL | Status: DC

## 2018-06-14 MED ORDER — HEPARIN SODIUM (PORCINE) 1000 UNIT/ML IJ SOLN
INTRAMUSCULAR | Status: DC | PRN
  Administered 2018-06-14: 3000 [IU] via INTRAVENOUS
  Administered 2018-06-14: 19000 [IU] via INTRAVENOUS

## 2018-06-14 MED ORDER — HEPARIN SODIUM (PORCINE) 1000 UNIT/ML IJ SOLN
INTRAMUSCULAR | Status: AC
  Filled 2018-06-14: qty 1

## 2018-06-14 MED ORDER — PROTAMINE SULFATE 10 MG/ML IV SOLN
INTRAVENOUS | Status: AC
  Filled 2018-06-14: qty 25

## 2018-06-14 MED ORDER — VANCOMYCIN HCL 1000 MG IV SOLR
INTRAVENOUS | Status: DC | PRN
  Administered 2018-06-14: 1000 mL

## 2018-06-14 MED ORDER — METOPROLOL TARTRATE 5 MG/5ML IV SOLN
2.5000 mg | INTRAVENOUS | Status: DC | PRN
  Administered 2018-06-14: 2.5 mg via INTRAVENOUS
  Filled 2018-06-14: qty 5

## 2018-06-14 MED ORDER — SODIUM CHLORIDE 0.9% IV SOLUTION: Freq: Once | INTRAVENOUS | Status: DC

## 2018-06-14 MED ORDER — CHLORHEXIDINE GLUCONATE 0.12% ORAL RINSE (MEDLINE KIT): 15.0000 mL | Freq: Two times a day (BID) | OROMUCOSAL | Status: DC

## 2018-06-14 MED ORDER — ARTIFICIAL TEARS OPHTHALMIC OINT
TOPICAL_OINTMENT | OPHTHALMIC | Status: DC | PRN
  Administered 2018-06-14: 1 via OPHTHALMIC

## 2018-06-14 MED ORDER — TRAMADOL HCL 50 MG PO TABS
50.0000 mg | ORAL_TABLET | ORAL | Status: DC | PRN
  Administered 2018-06-15: 100 mg via ORAL
  Administered 2018-06-16: 50 mg via ORAL
  Administered 2018-06-17: 100 mg via ORAL
  Filled 2018-06-14: qty 2
  Filled 2018-06-14: qty 1
  Filled 2018-06-14: qty 2

## 2018-06-14 MED ORDER — NITROPRUSSIDE SODIUM-NACL 10-0.9 MG/50ML-% IV SOLN
0.0000 ug/kg/min | INTRAVENOUS | Status: DC
  Filled 2018-06-14: qty 50

## 2018-06-14 MED ORDER — DEXMEDETOMIDINE HCL IN NACL 200 MCG/50ML IV SOLN
0.0000 ug/kg/h | INTRAVENOUS | Status: DC
  Administered 2018-06-14: 0.4 ug/kg/h via INTRAVENOUS
  Filled 2018-06-14: qty 50

## 2018-06-14 MED ORDER — ORAL CARE MOUTH RINSE
15.0000 mL | Freq: Two times a day (BID) | OROMUCOSAL | Status: DC
  Administered 2018-06-14 – 2018-06-15 (×2): 15 mL via OROMUCOSAL

## 2018-06-14 MED ORDER — PHENYLEPHRINE 40 MCG/ML (10ML) SYRINGE FOR IV PUSH (FOR BLOOD PRESSURE SUPPORT)
PREFILLED_SYRINGE | INTRAVENOUS | Status: AC
  Filled 2018-06-14: qty 10

## 2018-06-14 MED ORDER — DOCUSATE SODIUM 100 MG PO CAPS
200.0000 mg | ORAL_CAPSULE | Freq: Every day | ORAL | Status: DC
  Administered 2018-06-15 – 2018-06-18 (×4): 200 mg via ORAL
  Filled 2018-06-14 (×4): qty 2

## 2018-06-14 MED ORDER — FENTANYL CITRATE (PF) 250 MCG/5ML IJ SOLN
INTRAMUSCULAR | Status: AC
  Filled 2018-06-14: qty 25

## 2018-06-14 MED ORDER — HEMOSTATIC AGENTS (NO CHARGE) OPTIME
TOPICAL | Status: DC | PRN
  Administered 2018-06-14: 2 via TOPICAL
  Administered 2018-06-14: 1 via TOPICAL

## 2018-06-14 MED ORDER — 0.9 % SODIUM CHLORIDE (POUR BTL) OPTIME
TOPICAL | Status: DC | PRN
  Administered 2018-06-14: 5000 mL

## 2018-06-14 MED ORDER — PANTOPRAZOLE SODIUM 40 MG PO TBEC
40.0000 mg | DELAYED_RELEASE_TABLET | Freq: Every day | ORAL | Status: DC
  Administered 2018-06-16 – 2018-06-18 (×3): 40 mg via ORAL
  Filled 2018-06-14 (×3): qty 1

## 2018-06-14 MED ORDER — ONDANSETRON HCL 4 MG/2ML IJ SOLN
4.0000 mg | Freq: Four times a day (QID) | INTRAMUSCULAR | Status: DC | PRN
  Administered 2018-06-14 – 2018-06-17 (×7): 4 mg via INTRAVENOUS
  Filled 2018-06-14 (×7): qty 2

## 2018-06-14 MED ORDER — ROCURONIUM BROMIDE 10 MG/ML (PF) SYRINGE
PREFILLED_SYRINGE | INTRAVENOUS | Status: DC | PRN
  Administered 2018-06-14 (×2): 50 mg via INTRAVENOUS
  Administered 2018-06-14: 30 mg via INTRAVENOUS

## 2018-06-14 MED ORDER — SODIUM CHLORIDE 0.9 % IV SOLN
INTRAVENOUS | Status: DC | PRN
  Administered 2018-06-14: 13:00:00 via INTRAVENOUS

## 2018-06-14 MED ORDER — ACETAMINOPHEN 650 MG RE SUPP
650.0000 mg | Freq: Once | RECTAL | Status: AC
  Administered 2018-06-14: 650 mg via RECTAL

## 2018-06-14 MED ORDER — DEXAMETHASONE SODIUM PHOSPHATE 10 MG/ML IJ SOLN
INTRAMUSCULAR | Status: AC
  Filled 2018-06-14: qty 1

## 2018-06-14 MED ORDER — ASPIRIN 81 MG PO CHEW
324.0000 mg | CHEWABLE_TABLET | Freq: Every day | ORAL | Status: DC
  Administered 2018-06-15: 324 mg
  Filled 2018-06-14: qty 4

## 2018-06-14 MED ORDER — DEXAMETHASONE SODIUM PHOSPHATE 10 MG/ML IJ SOLN
INTRAMUSCULAR | Status: DC | PRN
  Administered 2018-06-14: 10 mg via INTRAVENOUS

## 2018-06-14 MED ORDER — METOPROLOL TARTRATE 25 MG/10 ML ORAL SUSPENSION: 12.5000 mg | Freq: Two times a day (BID) | ORAL | Status: DC

## 2018-06-14 MED ORDER — VANCOMYCIN HCL IN DEXTROSE 1-5 GM/200ML-% IV SOLN
1000.0000 mg | Freq: Once | INTRAVENOUS | Status: AC
  Administered 2018-06-14: 1000 mg via INTRAVENOUS
  Filled 2018-06-14: qty 200

## 2018-06-14 MED ORDER — SODIUM CHLORIDE 0.9 % IV SOLN: INTRAVENOUS | Status: AC

## 2018-06-14 MED ORDER — ORAL CARE MOUTH RINSE
15.0000 mL | OROMUCOSAL | Status: DC
  Administered 2018-06-14: 15 mL via OROMUCOSAL

## 2018-06-14 MED ORDER — PHENYLEPHRINE 40 MCG/ML (10ML) SYRINGE FOR IV PUSH (FOR BLOOD PRESSURE SUPPORT)
PREFILLED_SYRINGE | INTRAVENOUS | Status: DC | PRN
  Administered 2018-06-14 (×2): 40 ug via INTRAVENOUS

## 2018-06-14 MED ORDER — METOPROLOL TARTRATE 12.5 MG HALF TABLET
12.5000 mg | ORAL_TABLET | Freq: Two times a day (BID) | ORAL | Status: DC
  Administered 2018-06-15 – 2018-06-16 (×4): 12.5 mg via ORAL
  Filled 2018-06-14 (×5): qty 1

## 2018-06-14 MED ORDER — INSULIN REGULAR BOLUS VIA INFUSION
0.0000 [IU] | Freq: Three times a day (TID) | INTRAVENOUS | Status: DC
  Filled 2018-06-14: qty 10

## 2018-06-14 MED ORDER — SODIUM CHLORIDE 0.9 % IV SOLN
INTRAVENOUS | Status: DC | PRN
  Administered 2018-06-14: 15 ug/min via INTRAVENOUS

## 2018-06-14 MED ORDER — MIDAZOLAM HCL 5 MG/5ML IJ SOLN
INTRAMUSCULAR | Status: DC | PRN
  Administered 2018-06-14: 3 mg via INTRAVENOUS
  Administered 2018-06-14 (×2): 2 mg via INTRAVENOUS

## 2018-06-14 MED ORDER — LACTATED RINGERS IV SOLN: 500.0000 mL | Freq: Once | INTRAVENOUS | Status: DC | PRN

## 2018-06-14 MED ORDER — ROSUVASTATIN CALCIUM 10 MG PO TABS
20.0000 mg | ORAL_TABLET | Freq: Every day | ORAL | Status: DC
  Administered 2018-06-15 – 2018-06-18 (×4): 20 mg via ORAL
  Filled 2018-06-14: qty 1
  Filled 2018-06-14: qty 2
  Filled 2018-06-14 (×2): qty 1

## 2018-06-14 MED ORDER — BISACODYL 5 MG PO TBEC
10.0000 mg | DELAYED_RELEASE_TABLET | Freq: Every day | ORAL | Status: DC
  Administered 2018-06-16 – 2018-06-17 (×2): 10 mg via ORAL
  Filled 2018-06-14 (×4): qty 2

## 2018-06-14 MED FILL — Potassium Chloride Inj 2 mEq/ML: INTRAVENOUS | Qty: 20 | Status: AC

## 2018-06-14 MED FILL — Magnesium Sulfate Inj 50%: INTRAMUSCULAR | Qty: 10 | Status: AC

## 2018-06-14 SURGICAL SUPPLY — 105 items
APPLICATOR COTTON TIP 6 STRL (MISCELLANEOUS) ×2 IMPLANT
APPLICATOR COTTON TIP 6IN STRL (MISCELLANEOUS) ×3
APPLIER CLIP 9.375 SM OPEN (CLIP) ×6
BAG DECANTER FOR FLEXI CONT (MISCELLANEOUS) ×6 IMPLANT
BANDAGE ACE 4X5 VEL STRL LF (GAUZE/BANDAGES/DRESSINGS) ×6 IMPLANT
BANDAGE ACE 6X5 VEL STRL LF (GAUZE/BANDAGES/DRESSINGS) ×6 IMPLANT
BASKET HEART (ORDER IN 25'S) (MISCELLANEOUS) ×1
BASKET HEART (ORDER IN 25S) (MISCELLANEOUS) ×2 IMPLANT
BLADE CLIPPER SURG (BLADE) IMPLANT
BLADE STERNUM SYSTEM 6 (BLADE) ×3 IMPLANT
BNDG GAUZE ELAST 4 BULKY (GAUZE/BANDAGES/DRESSINGS) ×6 IMPLANT
CANISTER SUCT 3000ML PPV (MISCELLANEOUS) ×3 IMPLANT
CANNULA EZ GLIDE AORTIC 21FR (CANNULA) ×6 IMPLANT
CATH CPB KIT OWEN (MISCELLANEOUS) ×3 IMPLANT
CATH THORACIC 36FR (CATHETERS) ×3 IMPLANT
CLIP APPLIE 9.375 SM OPEN (CLIP) ×4 IMPLANT
CLIP RETRACTION 3.0MM CORONARY (MISCELLANEOUS) ×3 IMPLANT
CLIP VESOCCLUDE MED 24/CT (CLIP) IMPLANT
CLIP VESOCCLUDE SM WIDE 24/CT (CLIP) IMPLANT
CONN ST 1/4X3/8  BEN (MISCELLANEOUS) ×3
CONN ST 1/4X3/8 BEN (MISCELLANEOUS) ×6 IMPLANT
COVER WAND RF STERILE (DRAPES) ×3 IMPLANT
CRADLE DONUT ADULT HEAD (MISCELLANEOUS) ×3 IMPLANT
DERMABOND ADVANCED (GAUZE/BANDAGES/DRESSINGS) ×2
DERMABOND ADVANCED .7 DNX12 (GAUZE/BANDAGES/DRESSINGS) ×4 IMPLANT
DRAIN CHANNEL 32F RND 10.7 FF (WOUND CARE) ×6 IMPLANT
DRAPE CARDIOVASCULAR INCISE (DRAPES) ×1
DRAPE INCISE IOBAN 66X45 STRL (DRAPES) ×3 IMPLANT
DRAPE SLUSH/WARMER DISC (DRAPES) ×3 IMPLANT
DRAPE SRG 135X102X78XABS (DRAPES) ×2 IMPLANT
DRSG AQUACEL AG ADV 3.5X14 (GAUZE/BANDAGES/DRESSINGS) ×3 IMPLANT
DRSG COVADERM 4X14 (GAUZE/BANDAGES/DRESSINGS) ×3 IMPLANT
ELECT BLADE 4.0 EZ CLEAN MEGAD (MISCELLANEOUS) ×3
ELECT REM PT RETURN 9FT ADLT (ELECTROSURGICAL) ×6
ELECTRODE BLDE 4.0 EZ CLN MEGD (MISCELLANEOUS) ×2 IMPLANT
ELECTRODE REM PT RTRN 9FT ADLT (ELECTROSURGICAL) ×4 IMPLANT
FELT TEFLON 1X6 (MISCELLANEOUS) ×6 IMPLANT
GAUZE SPONGE 4X4 12PLY STRL (GAUZE/BANDAGES/DRESSINGS) ×3 IMPLANT
GLOVE ORTHO TXT STRL SZ7.5 (GLOVE) ×6 IMPLANT
GOWN STRL REUS W/ TWL LRG LVL3 (GOWN DISPOSABLE) ×8 IMPLANT
GOWN STRL REUS W/TWL LRG LVL3 (GOWN DISPOSABLE) ×4
GRASPER SUT TROCAR 14GX15 (MISCELLANEOUS) ×3 IMPLANT
HEMOSTAT POWDER SURGIFOAM 1G (HEMOSTASIS) ×9 IMPLANT
INSERT FOGARTY XLG (MISCELLANEOUS) ×3 IMPLANT
KIT BASIN OR (CUSTOM PROCEDURE TRAY) ×3 IMPLANT
KIT SUCTION CATH 14FR (SUCTIONS) ×9 IMPLANT
KIT TURNOVER KIT B (KITS) ×3 IMPLANT
KIT VASOVIEW HEMOPRO 2 VH 4000 (KITS) ×3 IMPLANT
LEAD PACING MYOCARDI (MISCELLANEOUS) ×3 IMPLANT
MARKER GRAFT CORONARY BYPASS (MISCELLANEOUS) ×9 IMPLANT
NS IRRIG 1000ML POUR BTL (IV SOLUTION) ×15 IMPLANT
PACK E OPEN HEART (SUTURE) ×3 IMPLANT
PACK OPEN HEART (CUSTOM PROCEDURE TRAY) ×3 IMPLANT
PAD ARMBOARD 7.5X6 YLW CONV (MISCELLANEOUS) ×6 IMPLANT
PAD ELECT DEFIB RADIOL ZOLL (MISCELLANEOUS) ×3 IMPLANT
PENCIL BUTTON HOLSTER BLD 10FT (ELECTRODE) ×3 IMPLANT
PUNCH AORTIC ROT 4.0MM RCL 40 (MISCELLANEOUS) ×3 IMPLANT
PUNCH AORTIC ROTATE 4.0MM (MISCELLANEOUS) IMPLANT
PUNCH AORTIC ROTATE 4.5MM 8IN (MISCELLANEOUS) IMPLANT
PUNCH AORTIC ROTATE 5MM 8IN (MISCELLANEOUS) IMPLANT
SET CARDIOPLEGIA MPS 5001102 (MISCELLANEOUS) ×3 IMPLANT
SOLUTION ANTI FOG 6CC (MISCELLANEOUS) IMPLANT
SPONGE LAP 18X18 RF (DISPOSABLE) ×6 IMPLANT
SPONGE LAP 18X18 X RAY DECT (DISPOSABLE) IMPLANT
SPONGE LAP 4X18 RFD (DISPOSABLE) ×3 IMPLANT
SUT BONE WAX W31G (SUTURE) ×3 IMPLANT
SUT ETHIBOND X763 2 0 SH 1 (SUTURE) ×6 IMPLANT
SUT MNCRL AB 3-0 PS2 18 (SUTURE) ×6 IMPLANT
SUT MNCRL AB 4-0 PS2 18 (SUTURE) ×3 IMPLANT
SUT PDS AB 1 CTX 36 (SUTURE) ×6 IMPLANT
SUT PROLENE 2 0 SH DA (SUTURE) IMPLANT
SUT PROLENE 3 0 SH DA (SUTURE) ×6 IMPLANT
SUT PROLENE 3 0 SH1 36 (SUTURE) IMPLANT
SUT PROLENE 4 0 RB 1 (SUTURE)
SUT PROLENE 4 0 SH DA (SUTURE) ×6 IMPLANT
SUT PROLENE 4-0 RB1 .5 CRCL 36 (SUTURE) IMPLANT
SUT PROLENE 5 0 C 1 36 (SUTURE) IMPLANT
SUT PROLENE 6 0 C 1 30 (SUTURE) ×12 IMPLANT
SUT PROLENE 7.0 RB 3 (SUTURE) ×9 IMPLANT
SUT PROLENE 8 0 BV175 6 (SUTURE) ×3 IMPLANT
SUT PROLENE BLUE 7 0 (SUTURE) ×6 IMPLANT
SUT PROLENE POLY MONO (SUTURE) IMPLANT
SUT SILK  1 MH (SUTURE) ×2
SUT SILK 1 MH (SUTURE) ×4 IMPLANT
SUT STEEL 6MS V (SUTURE) IMPLANT
SUT STEEL STERNAL CCS#1 18IN (SUTURE) IMPLANT
SUT STEEL SZ 6 DBL 3X14 BALL (SUTURE) IMPLANT
SUT VIC AB 1 CTX 36 (SUTURE)
SUT VIC AB 1 CTX36XBRD ANBCTR (SUTURE) IMPLANT
SUT VIC AB 2-0 CT1 27 (SUTURE) ×1
SUT VIC AB 2-0 CT1 TAPERPNT 27 (SUTURE) ×2 IMPLANT
SUT VIC AB 2-0 CTX 27 (SUTURE) IMPLANT
SUT VIC AB 3-0 SH 27 (SUTURE)
SUT VIC AB 3-0 SH 27X BRD (SUTURE) IMPLANT
SUT VIC AB 3-0 X1 27 (SUTURE) IMPLANT
SUT VICRYL 4-0 PS2 18IN ABS (SUTURE) IMPLANT
SYSTEM SAHARA CHEST DRAIN ATS (WOUND CARE) ×3 IMPLANT
TAPE CLOTH SURG 4X10 WHT LF (GAUZE/BANDAGES/DRESSINGS) ×3 IMPLANT
TOWEL GREEN STERILE (TOWEL DISPOSABLE) ×3 IMPLANT
TOWEL GREEN STERILE FF (TOWEL DISPOSABLE) ×3 IMPLANT
TRAY FOLEY SLVR 16FR TEMP STAT (SET/KITS/TRAYS/PACK) ×3 IMPLANT
TUBING INSUFFLATION (TUBING) ×3 IMPLANT
UNDERPAD 30X30 (UNDERPADS AND DIAPERS) ×3 IMPLANT
WATER STERILE IRR 1000ML POUR (IV SOLUTION) ×6 IMPLANT
YANKAUER SUCT BULB TIP NO VENT (SUCTIONS) ×3 IMPLANT

## 2018-06-14 NOTE — Anesthesia Procedure Notes (Signed)
Procedure Name: Intubation Date/Time: 06/14/2018 8:07 AM Performed by: Imagene Riches, CRNA Pre-anesthesia Checklist: Patient identified, Emergency Drugs available, Suction available and Patient being monitored Patient Re-evaluated:Patient Re-evaluated prior to induction Oxygen Delivery Method: Circle System Utilized Preoxygenation: Pre-oxygenation with 100% oxygen Induction Type: IV induction Ventilation: Mask ventilation without difficulty Laryngoscope Size: Glidescope and 3 Grade View: Grade I Tube type: Oral Tube size: 7.5 mm Number of attempts: 3 Airway Equipment and Method: Stylet and Oral airway Placement Confirmation: ETT inserted through vocal cords under direct vision,  positive ETCO2 and breath sounds checked- equal and bilateral Secured at: 21 cm Tube secured with: Tape Dental Injury: Teeth and Oropharynx as per pre-operative assessment  Difficulty Due To: Difficult Airway- due to anterior larynx and Difficulty was anticipated Comments: First DL grade 3 view, esophageal intubation. Second DL successfully passed bougie through cords, ETT met significant resistance. Third DL with glidescope, grade 1 view, successfully passed ETT. Small glottis, recommend 7.0 or smaller ETT for future intubation.

## 2018-06-14 NOTE — Anesthesia Procedure Notes (Signed)
Central Venous Catheter Insertion Performed by: Achille Rich, MD, anesthesiologist Start/End10/25/2019 6:52 AM, 06/14/2018 7:02 AM Patient location: Pre-op. Preanesthetic checklist: patient identified, IV checked, site marked, risks and benefits discussed, surgical consent, monitors and equipment checked, pre-op evaluation, timeout performed and anesthesia consent Position: Trendelenburg Lidocaine 1% used for infiltration and patient sedated Hand hygiene performed , maximum sterile barriers used  and Seldinger technique used Catheter size: 8.5 Fr Central line and PA cath was placed.Sheath introducer Swan type:thermodilation Procedure performed using ultrasound guided technique. Ultrasound Notes:anatomy identified, needle tip was noted to be adjacent to the nerve/plexus identified, no ultrasound evidence of intravascular and/or intraneural injection and image(s) printed for medical record Attempts: 1 Following insertion, line sutured, dressing applied and Biopatch. Post procedure assessment: blood return through all ports, free fluid flow and no air  Patient tolerated the procedure well with no immediate complications.

## 2018-06-14 NOTE — Progress Notes (Signed)
CT surgery p.m. Rounds  Status post CABG x4 Patient process of ventilator wean, she is responsive Hemodynamics are stable Postop labs satisfactory

## 2018-06-14 NOTE — Procedures (Signed)
Extubation Procedure Note  Patient Details:   Name: Kam Kushnir DOB: 08/15/1941 MRN: [id number]   Airway Documentation:    Vent end date: (not recorded) Vent end time: (not recorded)   Evaluation  O2 sats: stable throughout Complications: No apparent complications Patient did tolerate procedure well. Bilateral Breath Sounds: Clear, Diminished   Yes  VC 0.7l NIF-40  Roanna Raider 06/14/2018, 10:25 PM

## 2018-06-14 NOTE — Progress Notes (Signed)
  Echocardiogram Echocardiogram Transesophageal has been performed.  Delcie Roch 06/14/2018, 9:08 AM

## 2018-06-14 NOTE — Transfer of Care (Signed)
Immediate Anesthesia Transfer of Care Note  Patient: Jaime Chavez  Procedure(s) Performed: CORONARY ARTERY BYPASS GRAFTING (CABG) times 4 using left internal mammary artery and right saphenous vein using endoscope. (N/A Chest) TRANSESOPHAGEAL ECHOCARDIOGRAM (TEE) (N/A )  Patient Location: SICU  Anesthesia Type:General  Level of Consciousness: sedated  Airway & Oxygen Therapy: Patient remains intubated per anesthesia plan and Patient placed on Ventilator (see vital sign flow sheet for setting)  Post-op Assessment: Report given to RN and Post -op Vital signs reviewed and stable  Post vital signs: Reviewed and stable  Last Vitals:  Vitals Value Taken Time  BP 107/72 06/14/2018  1:37 PM  Temp    Pulse 71 06/14/2018  1:37 PM  Resp 24 06/14/2018  1:37 PM  SpO2 100 % 06/14/2018  1:37 PM  Vitals shown include unvalidated device data.  Last Pain:  Vitals:   06/14/18 0412  TempSrc: Oral  PainSc:          Complications: No apparent anesthesia complications

## 2018-06-14 NOTE — Op Note (Signed)
CARDIOTHORACIC SURGERY OPERATIVE NOTE  Date of Procedure: 06/14/2018  Preoperative Diagnosis:   Severe 3-vessel Coronary Artery Disease  Unstable Angina Pectoris  Postoperative Diagnosis: Same  Procedure:    Coronary Artery Bypass Grafting x 4   Left Internal Mammary Artery to Distal Left Anterior Descending Coronary Artery  Saphenous Vein Graft to Distal Right Coronary Artery  Sequential Saphenous Vein Graft to Posterior Descending Coronary Artery  Saphenous Vein Graft to Obtuse Marginal Branch of Left Circumflex Coronary Artery  Endoscopic Vein Harvest from Bilateral Thighs and Right Lower Leg  Surgeon: Salvatore Decent. Cornelius Moras, MD  Assistant: Lowella Dandy, PA-C  Anesthesia: Shona Simpson, MD  Operative Findings:  Normal left ventricular systolic function  Good quality left internal mammary artery conduit  Good quality saphenous vein conduit  Good quality target vessels for grafting    BRIEF CLINICAL NOTE AND INDICATIONS FOR SURGERY  Patient is a 77 year old female with no previous history of coronary artery disease and risk factors notable only for history of hyperlipidemia who has been referred for surgical consultation to discuss treatment options for management of severe multivessel coronary artery disease with unstable angina pectoris.  Patient states that she has been healthy all of her adult life.  She admits to a sedentary lifestyle, and she states that she has been slowing down physically for the last few years.  She complains that she gets tired easily.  A proximally 4 months ago the patient began to experience substernal chest pressure with physical exertion.  Symptoms have increased in frequency and severity over the last several months, and recently she had 2 brief episodes of chest pressure while she was sitting down.  On the evening of 06/12/2018 she had some chest pressure which awoke her from her sleep.  Symptoms occasionally radiate to her left arm and beneath  her breasts and are associated with shortness of breath.  She described the symptoms to her primary care physician who gave her a prescription for sublingual nitroglycerin and referred for elective cardiology consultation.  Patient was seen by Dr. Jacques Navy and promptly scheduled for diagnostic cardiac catheterization.  Catheterization performed by Dr. Okey Dupre reveals severe three-vessel coronary artery disease with preserved left ventricular systolic function.  Cardiothoracic surgical consultation was requested.  The patient has been seen in consultation and counseled at length regarding the indications, risks and potential benefits of surgery.  All questions have been answered, and the patient provides full informed consent for the operation as described.    DETAILS OF THE OPERATIVE PROCEDURE  Preparation:  The patient is brought to the operating room on the above mentioned date and central monitoring was established by the anesthesia team including placement of Swan-Ganz catheter and radial arterial line. The patient is placed in the supine position on the operating table.  Intravenous antibiotics are administered. General endotracheal anesthesia is induced uneventfully. A Foley catheter is placed.  Baseline transesophageal echocardiogram was performed.  Findings were notable for normal LV function.  The patient's chest, abdomen, both groins, and both lower extremities are prepared and draped in a sterile manner. A time out procedure is performed.   Surgical Approach and Conduit Harvest:  A median sternotomy incision was performed and the left internal mammary artery is dissected from the chest wall and prepared for bypass grafting. The left internal mammary artery is notably good quality conduit. Simultaneously, the greater saphenous vein is obtained from the patient's right thigh and right lower leg using endoscopic vein harvest technique. The saphenous vein is notably good quality  conduit,  although below the knee it was too small.  Another segment of greater saphenous vein was removed from the left thigh using endoscopic vein harvest technique. After removal of the saphenous vein, the small surgical incisions in the lower extremity are closed with absorbable suture. Following systemic heparinization, the left internal mammary artery was transected distally noted to have excellent flow.   Extracorporeal Cardiopulmonary Bypass and Myocardial Protection:  The pericardium is opened. The ascending aorta is normal in appearance. The ascending aorta and the right atrium are cannulated for cardiopulmonary bypass.  Adequate heparinization is verified.     The entire pre-bypass portion of the operation was notable for stable hemodynamics.  Cardiopulmonary bypass was begun and the surface of the heart is inspected. Distal target vessels are selected for coronary artery bypass grafting. A cardioplegia cannula is placed in the ascending aorta.  A temperature probe was placed in the interventricular septum.  The patient is allowed to cool passively to [facility] systemic temperature.  The aortic cross clamp is applied and cold blood cardioplegia is delivered initially in an antegrade fashion through the aortic root.   Iced saline slush is applied for topical hypothermia.  The initial cardioplegic arrest is rapid with early diastolic arrest.  Repeat doses of cardioplegia are administered intermittently throughout the entire cross clamp portion of the operation through the aortic root and through subsequently placed vein grafts in order to maintain completely flat electrocardiogram and septal myocardial temperature below 15C.  Myocardial protection was felt to be excellent.  Coronary Artery Bypass Grafting:   The distal right coronary artery was grafted using a reversed saphenous vein graft in an side-to-side fashion.  At the site of distal anastomosis the target vessel was good quality and measured  approximately 2.0 mm in diameter.  The posterior descending branch of the right coronary artery was grafted using a sequential saphenous vein graft in an end-to-side fashion using the distal portion of the vein placed to the right coronary artery.  At the site of distal anastomosis the target vessel was fair quality and measured approximately 1.3 mm in diameter.  The obtuse marginal branch of the left circumflex coronary artery was grafted using a reversed saphenous vein graft in an end-to-side fashion.  At the site of distal anastomosis the target vessel was good quality and measured approximately 1.5 mm in diameter.  The distal left anterior coronary artery was grafted with the left internal mammary artery in an end-to-side fashion.  At the site of distal anastomosis the target vessel was good quality and measured approximately 1.5 mm in diameter.  The vessel was intramyocardial.  All proximal vein graft anastomoses were placed directly to the ascending aorta prior to removal of the aortic cross clamp.  The septal myocardial temperature rose rapidly after reperfusion of the left internal mammary artery graft.  The aortic cross clamp was removed after a total cross clamp time of 77 minutes.   Procedure Completion:  All proximal and distal coronary anastomoses were inspected for hemostasis and appropriate graft orientation. Epicardial pacing wires are fixed to the right ventricular outflow tract and to the right atrial appendage. The patient is rewarmed to 37C temperature. The patient is weaned and disconnected from cardiopulmonary bypass.  The patient's rhythm at separation from bypass was sinus.  The patient was weaned from cardiopulmonary bypass without any inotropic support. Total cardiopulmonary bypass time for the operation was 93 minutes.  Followup transesophageal echocardiogram performed after separation from bypass revealed no changes from the preoperative  exam.  The aortic and venous  cannula were removed uneventfully. Protamine was administered to reverse the anticoagulation. The mediastinum and pleural space were inspected for hemostasis and irrigated with saline solution. The mediastinum and both pleural spaces were drained using 4 chest tubes placed through separate stab incisions inferiorly.  The soft tissues anterior to the aorta were reapproximated loosely. The sternum is closed with double strength sternal wire. The soft tissues anterior to the sternum were closed in multiple layers and the skin is closed with a running subcuticular skin closure.  The post-bypass portion of the operation was notable for stable rhythm and hemodynamics.  No blood products were administered during the operation.   Disposition:  The patient tolerated the procedure well and is transported to the surgical intensive care in stable condition. There are no intraoperative complications. All sponge instrument and needle counts are verified correct at completion of the operation.    Salvatore Decent. Cornelius Moras MD 06/14/2018 1:02 PM

## 2018-06-14 NOTE — Anesthesia Procedure Notes (Signed)
Arterial Line Insertion Start/End10/25/2019 6:45 AM, 06/14/2018 7:00 AM Performed by: Elliot Dally, CRNA, CRNA  Patient location: Pre-op. Preanesthetic checklist: patient identified, IV checked, site marked, risks and benefits discussed, surgical consent, monitors and equipment checked, pre-op evaluation, timeout performed and anesthesia consent Lidocaine 1% used for infiltration Left, radial was placed Hand hygiene performed , maximum sterile barriers used  and Seldinger technique used Allen's test indicative of satisfactory collateral circulation Attempts: 2 Procedure performed without using ultrasound guided technique. Following insertion, Biopatch and dressing applied. Post procedure assessment: normal  Post procedure complications: local hematoma. Patient tolerated the procedure well with no immediate complications.

## 2018-06-14 NOTE — Plan of Care (Signed)
  Problem: Education: Goal: Individualized Educational Video(s) Outcome: Not Met (add Reason)  Pt refused educational video.

## 2018-06-14 NOTE — Anesthesia Preprocedure Evaluation (Addendum)
Anesthesia Evaluation  Patient identified by MRN, date of birth, ID band Patient awake    Reviewed: Allergy & Precautions, NPO status , Patient's Chart, lab work & pertinent test results  Airway Mallampati: III  TM Distance: >3 FB Neck ROM: Full    Dental  (+) Teeth Intact, Dental Advisory Given   Pulmonary neg pulmonary ROS,    breath sounds clear to auscultation       Cardiovascular + CAD   Rhythm:Regular Rate:Normal     Neuro/Psych negative neurological ROS     GI/Hepatic Neg liver ROS, GERD  Medicated,  Endo/Other  negative endocrine ROS  Renal/GU negative Renal ROS     Musculoskeletal negative musculoskeletal ROS (+)   Abdominal Normal abdominal exam  (+)   Peds  Hematology negative hematology ROS (+)   Anesthesia Other Findings   Reproductive/Obstetrics                            Lab Results  Component Value Date   WBC 7.1 06/14/2018   HGB 12.1 06/14/2018   HCT 38.9 06/14/2018   MCV 91.7 06/14/2018   PLT 236 06/14/2018   Lab Results  Component Value Date   CREATININE 0.65 06/14/2018   BUN 8 06/14/2018   NA 140 06/14/2018   K 3.7 06/14/2018   CL 110 06/14/2018   CO2 24 06/14/2018   Lab Results  Component Value Date   INR 1.10 06/13/2018   Echo: - Left ventricle: The cavity size was mildly reduced. Wall   thickness was increased in a pattern of mild LVH. Systolic   function was normal. The estimated ejection fraction was in the   range of 55% to 60%. Wall motion was normal; there were no   regional wall motion abnormalities. The study is indeterminate   for the evaluation of LV diastolic function. - Aortic valve: There was no significant regurgitation. - Mitral valve: Calcified annulus. There was trivial regurgitation. - Right ventricle: Systolic function was normal. - Atrial septum: There was a possible, small patent foramen ovale. - Tricuspid valve: There was  trivial regurgitation. - Pulmonic valve: There was trivial regurgitation. - Pericardium, extracardiac: A trivial pericardial effusion was   identified.  Anesthesia Physical Anesthesia Plan  ASA: IV  Anesthesia Plan: General   Post-op Pain Management:    Induction: Intravenous  PONV Risk Score and Plan: 4 or greater and Treatment may vary due to age or medical condition  Airway Management Planned: Oral ETT  Additional Equipment: Arterial line, CVP, PA Cath, TEE and Ultrasound Guidance Line Placement  Intra-op Plan:   Post-operative Plan: Post-operative intubation/ventilation  Informed Consent: I have reviewed the patients History and Physical, chart, labs and discussed the procedure including the risks, benefits and alternatives for the proposed anesthesia with the patient or authorized representative who has indicated his/her understanding and acceptance.   Dental advisory given  Plan Discussed with: CRNA  Anesthesia Plan Comments:        Anesthesia Quick Evaluation

## 2018-06-14 NOTE — Progress Notes (Signed)
      [address]       [place] [address]             [phone]     CARDIOTHORACIC SURGERY PROGRESS NOTE  Subjective: Jaime Chavez has been scheduled for Procedure(s): CORONARY ARTERY BYPASS GRAFTING (CABG) (N/A) TRANSESOPHAGEAL ECHOCARDIOGRAM (TEE) (N/A) today.   Objective: Vital signs in last 24 hours: Temp:  [97.7 F (36.5 C)-98.5 F (36.9 C)] 98.5 F (36.9 C) (10/25 0412) Pulse Rate:  [67-80] 68 (10/24 1953) Cardiac Rhythm: Normal sinus rhythm (10/24 1900) Resp:  [10-23] 17 (10/25 0412) BP: (113-211)/(63-102) 122/63 (10/25 0412) SpO2:  [95 %-100 %] 99 % (10/25 0412) Weight:  [72.6 kg-73.4 kg] 73.4 kg (10/25 0323)  Physical Exam: Unchanged from previously   Intake/Output from previous day: 10/24 0701 - 10/25 0700 In: 1087 [P.O.:390; I.V.:697] Out: 800 [Urine:800] Intake/Output this shift: No intake/output data recorded.  Lab Results: Recent Labs    06/12/18 1504 06/14/18 0323  WBC 7.6 7.1  HGB 14.5 12.1  HCT 43.5 38.9  PLT 238 236   BMET:  Recent Labs    06/13/18 1942 06/14/18 0323  NA 139 140  K 3.8 3.7  CL 109 110  CO2 24 24  GLUCOSE 128* 109*  BUN 8 8  CREATININE 0.64 0.65  CALCIUM 8.3* 8.3*    CBG (last 3)  No results for input(s): GLUCAP in the last 72 hours. PT/INR:   Recent Labs    06/13/18 1942  LABPROT 14.1  INR 1.10    Assessment/Plan:   The various methods of treatment have been discussed with the patient. After consideration of the risks, benefits and treatment options the patient has consented to the planned procedure.   The patient has been seen and labs reviewed. There are no changes in the patient's condition to prevent proceeding with the planned procedure today.   [name], MD 06/14/2018 7:08 AM

## 2018-06-14 NOTE — Progress Notes (Signed)
Patient interviewed in the preop area. Patient able to confirm name, DOB, procedure, allergies, npo status and no pain.   Willeen Niece

## 2018-06-14 NOTE — Brief Op Note (Signed)
06/13/2018 - 06/14/2018  12:58 PM  PATIENT:  Jaime Chavez  77 y.o. female  PRE-OPERATIVE DIAGNOSIS:  CAD  POST-OPERATIVE DIAGNOSIS:  CAD  PROCEDURE:  Procedure(s):  CORONARY ARTERY BYPASS GRAFTING x 4 -LIMA to Left Anterior Descending -SVG to Obtuse Marginal -Sequential SVG to Distal Right Coronary and Posterior Descending Artery  ENDOSCOPIC HARVEST GREATER SAPHENOUS VEIN -Right Leg -Left Thigh  TRANSESOPHAGEAL ECHOCARDIOGRAM (TEE) (N/A)  SURGEON:  Surgeon(s) and Role:    [name], MD - Primary  PHYSICIAN ASSISTANT: Lowella Dandy PA-C  ANESTHESIA:   general  EBL:  575 mL  BLOOD ADMINISTERED: CELLSAVER  DRAINS: Right and Left Pleural Chest Tubes, Mediastinal Chest Drains   LOCAL MEDICATIONS USED:  NONE  SPECIMEN:  No Specimen  DISPOSITION OF SPECIMEN:  N/A  COUNTS:  YES  TOURNIQUET:  * No tourniquets in log *  DICTATION: .Dragon Dictation  PLAN OF CARE: Admit to inpatient   PATIENT DISPOSITION:  ICU - intubated and hemodynamically stable.   Delay start of Pharmacological VTE agent (>24hrs) due to surgical blood loss or risk of bleeding: yes

## 2018-06-15 ENCOUNTER — Inpatient Hospital Stay (HOSPITAL_COMMUNITY): Payer: Medicare HMO

## 2018-06-15 DIAGNOSIS — Z951 Presence of aortocoronary bypass graft: Secondary | ICD-10-CM

## 2018-06-15 LAB — TYPE AND SCREEN
ABO/RH(D): O POS
Antibody Screen: NEGATIVE
UNIT DIVISION: 0
Unit division: 0

## 2018-06-15 LAB — GLUCOSE, CAPILLARY
GLUCOSE-CAPILLARY: 153 mg/dL — AB (ref 70–99)
GLUCOSE-CAPILLARY: 166 mg/dL — AB (ref 70–99)
GLUCOSE-CAPILLARY: 288 mg/dL — AB (ref 70–99)
GLUCOSE-CAPILLARY: 52 mg/dL — AB (ref 70–99)
GLUCOSE-CAPILLARY: 95 mg/dL (ref 70–99)
Glucose-Capillary: 160 mg/dL — ABNORMAL HIGH (ref 70–99)
Glucose-Capillary: 155 mg/dL — ABNORMAL HIGH (ref 70–99)
Glucose-Capillary: 173 mg/dL — ABNORMAL HIGH (ref 70–99)
Glucose-Capillary: 146 mg/dL — ABNORMAL HIGH (ref 70–99)

## 2018-06-15 LAB — POCT I-STAT, CHEM 8
BUN: 7 mg/dL — ABNORMAL LOW (ref 8–23)
Calcium, Ion: 1 mmol/L — ABNORMAL LOW (ref 1.15–1.40)
Chloride: 104 mmol/L (ref 98–111)
Creatinine, Ser: 0.7 mg/dL (ref 0.44–1.00)
GLUCOSE: 171 mg/dL — AB (ref 70–99)
HCT: 25 % — ABNORMAL LOW (ref 36.0–46.0)
HEMOGLOBIN: 8.5 g/dL — AB (ref 12.0–15.0)
POTASSIUM: 3.7 mmol/L (ref 3.5–5.1)
Sodium: 139 mmol/L (ref 135–145)
TCO2: 24 mmol/L (ref 22–32)

## 2018-06-15 LAB — BASIC METABOLIC PANEL
ANION GAP: 6 (ref 5–15)
BUN: 5 mg/dL — ABNORMAL LOW (ref 8–23)
CO2: 20 mmol/L — ABNORMAL LOW (ref 22–32)
Calcium: 6.8 mg/dL — ABNORMAL LOW (ref 8.9–10.3)
Chloride: 113 mmol/L — ABNORMAL HIGH (ref 98–111)
Creatinine, Ser: 0.57 mg/dL (ref 0.44–1.00)
Glucose, Bld: 178 mg/dL — ABNORMAL HIGH (ref 70–99)
Potassium: 4.2 mmol/L (ref 3.5–5.1)
SODIUM: 139 mmol/L (ref 135–145)

## 2018-06-15 LAB — BPAM RBC
BLOOD PRODUCT EXPIRATION DATE: 201911262359
Blood Product Expiration Date: 201911262359
ISSUE DATE / TIME: 201910251435
ISSUE DATE / TIME: 201910251435
UNIT TYPE AND RH: 5100
Unit Type and Rh: 5100

## 2018-06-15 LAB — BLOOD GAS, ARTERIAL
Acid-base deficit: 4.3 mmol/L — ABNORMAL HIGH (ref 0.0–2.0)
BICARBONATE: 20.1 mmol/L (ref 20.0–28.0)
Drawn by: 252031
O2 CONTENT: 4 L/min
O2 Saturation: 97.9 %
PATIENT TEMPERATURE: 98.6
PH ART: 7.368 (ref 7.350–7.450)
PO2 ART: 107 mmHg (ref 83.0–108.0)
pCO2 arterial: 35.8 mmHg (ref 32.0–48.0)

## 2018-06-15 LAB — CBC
HCT: 29.7 % — ABNORMAL LOW (ref 36.0–46.0)
HEMATOCRIT: 28.5 % — AB (ref 36.0–46.0)
Hemoglobin: 9.1 g/dL — ABNORMAL LOW (ref 12.0–15.0)
Hemoglobin: 8.7 g/dL — ABNORMAL LOW (ref 12.0–15.0)
MCH: 26.8 pg (ref 26.0–34.0)
MCH: 27.1 pg (ref 26.0–34.0)
MCHC: 30.6 g/dL (ref 30.0–36.0)
MCHC: 30.5 g/dL (ref 30.0–36.0)
MCV: 87.4 fL (ref 80.0–100.0)
MCV: 88.8 fL (ref 80.0–100.0)
NRBC: 0 % (ref 0.0–0.2)
PLATELETS: 174 10*3/uL (ref 150–400)
Platelets: 180 10*3/uL (ref 150–400)
RBC: 3.4 MIL/uL — ABNORMAL LOW (ref 3.87–5.11)
RBC: 3.21 MIL/uL — ABNORMAL LOW (ref 3.87–5.11)
RDW: 16.9 % — AB (ref 11.5–15.5)
RDW: 17.1 % — AB (ref 11.5–15.5)
WBC: 11.3 10*3/uL — AB (ref 4.0–10.5)
WBC: 10.1 10*3/uL (ref 4.0–10.5)
nRBC: 0 % (ref 0.0–0.2)

## 2018-06-15 LAB — CREATININE, SERUM
Creatinine, Ser: 0.82 mg/dL (ref 0.44–1.00)
GFR calc Af Amer: >60 mL/min (ref >60)
GFR calc non Af Amer: >60 mL/min (ref >60)

## 2018-06-15 LAB — MAGNESIUM
Magnesium: 2.4 mg/dL (ref 1.7–2.4)
Magnesium: 2.4 mg/dL (ref 1.7–2.4)

## 2018-06-15 MED ORDER — FUROSEMIDE 10 MG/ML IJ SOLN
20.0000 mg | Freq: Four times a day (QID) | INTRAMUSCULAR | Status: DC
  Administered 2018-06-15 – 2018-06-16 (×5): 20 mg via INTRAVENOUS
  Filled 2018-06-15 (×5): qty 2

## 2018-06-15 MED ORDER — POTASSIUM CHLORIDE 10 MEQ/50ML IV SOLN
10.0000 meq | INTRAVENOUS | Status: AC | PRN
  Administered 2018-06-15 – 2018-06-16 (×3): 10 meq via INTRAVENOUS
  Filled 2018-06-15 (×3): qty 50

## 2018-06-15 MED ORDER — KETOROLAC TROMETHAMINE 15 MG/ML IJ SOLN: 15.0000 mg | Freq: Four times a day (QID) | INTRAMUSCULAR | Status: DC

## 2018-06-15 MED ORDER — INSULIN ASPART 100 UNIT/ML ~~LOC~~ SOLN
0.0000 [IU] | SUBCUTANEOUS | Status: DC
  Administered 2018-06-15: 2 [IU] via SUBCUTANEOUS
  Administered 2018-06-15: 4 [IU] via SUBCUTANEOUS
  Administered 2018-06-15: 2 [IU] via SUBCUTANEOUS
  Administered 2018-06-15: 4 [IU] via SUBCUTANEOUS
  Administered 2018-06-16: 2 [IU] via SUBCUTANEOUS

## 2018-06-15 MED ORDER — DEXTROSE 50 % IV SOLN
1.0000 | Freq: Once | INTRAVENOUS | Status: AC
  Administered 2018-06-15: 50 mL via INTRAVENOUS
  Filled 2018-06-15: qty 50

## 2018-06-15 MED ORDER — INSULIN ASPART 100 UNIT/ML ~~LOC~~ SOLN
0.0000 [IU] | SUBCUTANEOUS | Status: DC
  Administered 2018-06-15 (×2): 2 [IU] via SUBCUTANEOUS

## 2018-06-15 MED ORDER — KETOROLAC TROMETHAMINE 15 MG/ML IJ SOLN
15.0000 mg | Freq: Once | INTRAMUSCULAR | Status: AC
  Administered 2018-06-15: 15 mg via INTRAVENOUS
  Filled 2018-06-15: qty 1

## 2018-06-15 NOTE — Anesthesia Postprocedure Evaluation (Addendum)
Anesthesia Post Note  Patient: Jaime Chavez  Procedure(s) Performed: CORONARY ARTERY BYPASS GRAFTING (CABG) times 4 using left internal mammary artery and right saphenous vein using endoscope. (N/A Chest) TRANSESOPHAGEAL ECHOCARDIOGRAM (TEE) (N/A )     Patient location during evaluation: SICU Anesthesia Type: General Level of consciousness: awake Pain management: pain level controlled Vital Signs Assessment: post-procedure vital signs reviewed and stable Respiratory status: spontaneous breathing Cardiovascular status: stable Postop Assessment: no apparent nausea or vomiting Anesthetic complications: no Comments: Recently extubated and doing well.     Last Vitals:  Vitals:   06/15/18 0700 06/15/18 0800  BP: 120/68 (!) 115/54  Pulse: 93 92  Resp: (!) 25 (!) 25  Temp: 37.1 C 37 C  SpO2: 99% 98%    Last Pain:  Vitals:   06/15/18 0800  TempSrc:   PainSc: 8                  Shelton Silvas

## 2018-06-15 NOTE — Progress Notes (Signed)
Hypoglycemic Event  CBG: 52  Treatment: D50 IV 50 mL  Symptoms: Pale  Follow-up CBG: Time:0047 CBG Result: 233  Possible Reasons for Event: Inadequate meal intake and Medication regimen: insulin drip (though was held last few hours d/t low BG)  Comments/MD notified:Pt switched to Q4 hr CBG per TCTS protocol. Will continue to monitor closely for hypoglycemic s/sx.

## 2018-06-15 NOTE — Progress Notes (Signed)
1 Day Post-Op Procedure(s) (LRB): CORONARY ARTERY BYPASS GRAFTING (CABG) times 4 using left internal mammary artery and right saphenous vein using endoscope. (N/A) TRANSESOPHAGEAL ECHOCARDIOGRAM (TEE) (N/A) Subjective: C/o L back pain A-=paced Stable hemodynamics, hypertensive  Objective: Vital signs in last 24 hours: Temp:  [95.5 F (35.3 C)-99.3 F (37.4 C)] 98.4 F (36.9 C) (10/26 0900) Pulse Rate:  [68-98] 87 (10/26 0900) Cardiac Rhythm: Atrial paced (10/26 0800) Resp:  [12-35] 15 (10/26 0900) BP: (84-146)/(52-93) 107/64 (10/26 0900) SpO2:  [97 %-100 %] 98 % (10/26 0900) Arterial Line BP: (99-170)/(48-83) 114/48 (10/26 0900) FiO2 (%):  [40 %-50 %] 40 % (10/25 1737) Weight:  [79 kg] 79 kg (10/26 0500)  Hemodynamic parameters for last 24 hours: PAP: (15-28)/(5-20) 17/6 CO:  [1.9 L/min-4 L/min] 3.4 L/min CI:  [1.1 L/min/m2-2.3 L/min/m2] 1.9 L/min/m2  Intake/Output from previous day: 10/25 0701 - 10/26 0700 In: 4621.8 [P.O.:120; I.V.:2821.9; Blood:610; IV Piggyback:1069.9] Out: 4515 [Urine:3060; Blood:575; Chest Tube:880] Intake/Output this shift: Total I/O In: 2.5 [I.V.:2.5] Out: 40 [Urine:40]       Exam    General- alert and comfortable    Neck- no JVD, no cervical adenopathy palpable, no carotid bruit   Lungs- clear without rales, wheezes   Cor- regular rate and rhythm, no murmur , gallop   Abdomen- soft, non-tender   Extremities - warm, non-tender, minimal edema   Neuro- oriented, appropriate, no focal weakness   Lab Results: Recent Labs    06/14/18 2018 06/15/18 0346  WBC 11.8* 11.3*  HGB 9.9* 9.1*  HCT 31.2* 29.7*  PLT 145* 174   BMET:  Recent Labs    06/14/18 0323  06/14/18 1948 06/14/18 2018 06/15/18 0346  NA 140   < > 141  --  139  K 3.7   < > 4.4  --  4.2  CL 110   < > 111  --  113*  CO2 24  --   --   --  20*  GLUCOSE 109*   < > 127*  --  178*  BUN 8   < > 6*  --  5*  CREATININE 0.65   < > 0.40* 0.63 0.57  CALCIUM 8.3*  --   --   --   6.8*   < > = values in this interval not displayed.    PT/INR:  Recent Labs    06/14/18 1346  LABPROT 18.3*  INR 1.54   ABG    Component Value Date/Time   PHART 7.368 06/15/2018 0320   HCO3 20.1 06/15/2018 0320   TCO2 20 (L) 06/14/2018 2021   ACIDBASEDEF 4.3 (H) 06/15/2018 0320   O2SAT 97.9 06/15/2018 0320   CBG (last 3)  Recent Labs    06/15/18 0048 06/15/18 0310 06/15/18 0804  GLUCAP 288* 153* 160*    Assessment/Plan: S/P Procedure(s) (LRB): CORONARY ARTERY BYPASS GRAFTING (CABG) times 4 using left internal mammary artery and right saphenous vein using endoscope. (N/A) TRANSESOPHAGEAL ECHOCARDIOGRAM (TEE) (N/A) DC lines, diuresis,mobilize   LOS: 2 days    Kathlee Nations Trigt III 06/15/2018

## 2018-06-15 NOTE — Progress Notes (Addendum)
CT surgery p.m. Rounds  Patient's back pain much better  Out of bed to chair Maintaining sinus rhythm Diuresis adequately Lasix P.m. labs are pending

## 2018-06-16 ENCOUNTER — Inpatient Hospital Stay (HOSPITAL_COMMUNITY): Payer: Medicare HMO

## 2018-06-16 LAB — BASIC METABOLIC PANEL
Anion gap: 8 (ref 5–15)
BUN: 10 mg/dL (ref 8–23)
CO2: 25 mmol/L (ref 22–32)
Calcium: 7.1 mg/dL — ABNORMAL LOW (ref 8.9–10.3)
Chloride: 106 mmol/L (ref 98–111)
Creatinine, Ser: 0.71 mg/dL (ref 0.44–1.00)
GFR calc Af Amer: >60 mL/min (ref >60)
GFR calc non Af Amer: >60 mL/min (ref >60)
Glucose, Bld: 132 mg/dL — ABNORMAL HIGH (ref 70–99)
Potassium: 4.6 mmol/L (ref 3.5–5.1)
Sodium: 139 mmol/L (ref 135–145)

## 2018-06-16 LAB — GLUCOSE, CAPILLARY
GLUCOSE-CAPILLARY: 124 mg/dL — AB (ref 70–99)
GLUCOSE-CAPILLARY: 119 mg/dL — AB (ref 70–99)
Glucose-Capillary: 139 mg/dL — ABNORMAL HIGH (ref 70–99)
Glucose-Capillary: 138 mg/dL — ABNORMAL HIGH (ref 70–99)
Glucose-Capillary: 134 mg/dL — ABNORMAL HIGH (ref 70–99)

## 2018-06-16 LAB — CBC
HCT: 26.1 % — ABNORMAL LOW (ref 36.0–46.0)
Hemoglobin: 8.1 g/dL — ABNORMAL LOW (ref 12.0–15.0)
MCH: 27.5 pg (ref 26.0–34.0)
MCHC: 31 g/dL (ref 30.0–36.0)
MCV: 88.5 fL (ref 80.0–100.0)
Platelets: 167 10*3/uL (ref 150–400)
RBC: 2.95 MIL/uL — ABNORMAL LOW (ref 3.87–5.11)
RDW: 17.2 % — ABNORMAL HIGH (ref 11.5–15.5)
WBC: 10.4 10*3/uL (ref 4.0–10.5)
nRBC: 0 % (ref 0.0–0.2)

## 2018-06-16 MED ORDER — INSULIN ASPART 100 UNIT/ML ~~LOC~~ SOLN
0.0000 [IU] | Freq: Three times a day (TID) | SUBCUTANEOUS | Status: DC
  Administered 2018-06-16 (×2): 2 [IU] via SUBCUTANEOUS

## 2018-06-16 MED ORDER — FUROSEMIDE 10 MG/ML IJ SOLN
40.0000 mg | Freq: Two times a day (BID) | INTRAMUSCULAR | Status: DC
  Administered 2018-06-16: 40 mg via INTRAVENOUS
  Filled 2018-06-16: qty 4

## 2018-06-16 MED ORDER — INSULIN ASPART 100 UNIT/ML ~~LOC~~ SOLN: 0.0000 [IU] | Freq: Every day | SUBCUTANEOUS | Status: DC

## 2018-06-16 MED ORDER — INSULIN ASPART 100 UNIT/ML ~~LOC~~ SOLN
0.0000 [IU] | Freq: Three times a day (TID) | SUBCUTANEOUS | Status: DC
  Administered 2018-06-16 – 2018-06-17 (×3): 2 [IU] via SUBCUTANEOUS

## 2018-06-16 NOTE — Progress Notes (Signed)
2 Days Post-Op Procedure(s) (LRB): CORONARY ARTERY BYPASS GRAFTING (CABG) times 4 using left internal mammary artery and right saphenous vein using endoscope. (N/A) TRANSESOPHAGEAL ECHOCARDIOGRAM (TEE) (N/A) Subjective: Patient improved Chest tube drainage minimal-will be removed Maintaining sinus rhythm Patient needs ambulation in hallway and further diuresis Keep in ICU another day Objective: Vital signs in last 24 hours: Temp:  [97.6 F (36.4 C)-98.3 F (36.8 C)] 98.3 F (36.8 C) (10/27 0800) Pulse Rate:  [73-88] 81 (10/27 1147) Cardiac Rhythm: Normal sinus rhythm (10/27 0400) Resp:  [0-25] 19 (10/27 1147) BP: (82-125)/(53-85) 122/59 (10/27 1145) SpO2:  [89 %-100 %] 93 % (10/27 1147) Arterial Line BP: (125)/(58) 125/58 (10/26 1200) Weight:  [77.6 kg] 77.6 kg (10/27 0500)  Hemodynamic parameters for last 24 hours:  Stable  Intake/Output from previous day: 10/26 0701 - 10/27 0700 In: 560 [P.O.:120; I.V.:83.9; IV Piggyback:356.2] Out: 2080 [Urine:1770; Chest Tube:310] Intake/Output this shift: Total I/O In: 243 [P.O.:240; I.V.:3] Out: 135 [Urine:75; Chest Tube:60]       Exam    General- alert and comfortable    Neck- no JVD, no cervical adenopathy palpable, no carotid bruit   Lungs- clear without rales, wheezes   Cor- regular rate and rhythm, no murmur , gallop   Abdomen- soft, non-tender   Extremities - warm, non-tender, minimal edema   Neuro- oriented, appropriate, no focal weakness   Lab Results: Recent Labs    06/15/18 1729 06/15/18 1741 06/16/18 0500  WBC 10.1  --  10.4  HGB 8.7* 8.5* 8.1*  HCT 28.5* 25.0* 26.1*  PLT 180  --  167   BMET:  Recent Labs    06/15/18 0346  06/15/18 1741 06/16/18 0500  NA 139  --  139 139  K 4.2  --  3.7 4.6  CL 113*  --  104 106  CO2 20*  --   --  25  GLUCOSE 178*  --  171* 132*  BUN 5*  --  7* 10  CREATININE 0.57   < > 0.70 0.71  CALCIUM 6.8*  --   --  7.1*   < > = values in this interval not displayed.     PT/INR:  Recent Labs    06/14/18 1346  LABPROT 18.3*  INR 1.54   ABG    Component Value Date/Time   PHART 7.368 06/15/2018 0320   HCO3 20.1 06/15/2018 0320   TCO2 24 06/15/2018 1741   ACIDBASEDEF 4.3 (H) 06/15/2018 0320   O2SAT 97.9 06/15/2018 0320   CBG (last 3)  Recent Labs    06/15/18 2317 06/16/18 0345 06/16/18 0820  GLUCAP 146* 124* 119*    Assessment/Plan: S/P Procedure(s) (LRB): CORONARY ARTERY BYPASS GRAFTING (CABG) times 4 using left internal mammary artery and right saphenous vein using endoscope. (N/A) TRANSESOPHAGEAL ECHOCARDIOGRAM (TEE) (N/A) Mobilize Diuresis Diabetes control d/c tubes/lines   LOS: 3 days    Kathlee Nations Trigt III 06/16/2018

## 2018-06-16 NOTE — Progress Notes (Signed)
CT surgery p.m. Rounds  Patient had good day-ambulated 300 feet after tubes removed Diuresing well Weaning oxygen Maintaining sinus rhythm Continue current care

## 2018-06-17 ENCOUNTER — Encounter (HOSPITAL_COMMUNITY): Payer: Self-pay | Admitting: Thoracic Surgery (Cardiothoracic Vascular Surgery)

## 2018-06-17 ENCOUNTER — Inpatient Hospital Stay (HOSPITAL_COMMUNITY): Payer: Medicare HMO

## 2018-06-17 LAB — BASIC METABOLIC PANEL
Anion gap: 8 (ref 5–15)
BUN: 15 mg/dL (ref 8–23)
CO2: 26 mmol/L (ref 22–32)
Calcium: 7.1 mg/dL — ABNORMAL LOW (ref 8.9–10.3)
Chloride: 104 mmol/L (ref 98–111)
Creatinine, Ser: 0.69 mg/dL (ref 0.44–1.00)
GFR calc Af Amer: >60 mL/min (ref >60)
GFR calc non Af Amer: >60 mL/min (ref >60)
Glucose, Bld: 126 mg/dL — ABNORMAL HIGH (ref 70–99)
Potassium: 3.8 mmol/L (ref 3.5–5.1)
Sodium: 138 mmol/L (ref 135–145)

## 2018-06-17 LAB — CBC
HCT: 27.6 % — ABNORMAL LOW (ref 36.0–46.0)
Hemoglobin: 8.5 g/dL — ABNORMAL LOW (ref 12.0–15.0)
MCH: 27 pg (ref 26.0–34.0)
MCHC: 30.8 g/dL (ref 30.0–36.0)
MCV: 87.6 fL (ref 80.0–100.0)
Platelets: 186 10*3/uL (ref 150–400)
RBC: 3.15 MIL/uL — ABNORMAL LOW (ref 3.87–5.11)
RDW: 16.4 % — ABNORMAL HIGH (ref 11.5–15.5)
WBC: 12.3 10*3/uL — ABNORMAL HIGH (ref 4.0–10.5)
nRBC: 0.2 % (ref 0.0–0.2)

## 2018-06-17 LAB — GLUCOSE, CAPILLARY
Glucose-Capillary: 137 mg/dL — ABNORMAL HIGH (ref 70–99)
Glucose-Capillary: 149 mg/dL — ABNORMAL HIGH (ref 70–99)

## 2018-06-17 MED ORDER — POTASSIUM CHLORIDE CRYS ER 20 MEQ PO TBCR
20.0000 meq | EXTENDED_RELEASE_TABLET | Freq: Every day | ORAL | Status: DC
  Administered 2018-06-17: 20 meq via ORAL
  Filled 2018-06-17: qty 1

## 2018-06-17 MED ORDER — FE FUMARATE-B12-VIT C-FA-IFC PO CAPS
1.0000 | ORAL_CAPSULE | Freq: Three times a day (TID) | ORAL | Status: DC
  Administered 2018-06-17 – 2018-06-18 (×5): 1 via ORAL
  Filled 2018-06-17 (×5): qty 1

## 2018-06-17 MED ORDER — ASPIRIN EC 81 MG PO TBEC
81.0000 mg | DELAYED_RELEASE_TABLET | Freq: Every day | ORAL | Status: DC
  Administered 2018-06-17 – 2018-06-18 (×2): 81 mg via ORAL
  Filled 2018-06-17 (×2): qty 1

## 2018-06-17 MED ORDER — MOVING RIGHT ALONG BOOK
Freq: Once | Status: AC
  Administered 2018-06-17: 11:00:00
  Filled 2018-06-17: qty 1

## 2018-06-17 MED ORDER — METOPROLOL TARTRATE 25 MG PO TABS
25.0000 mg | ORAL_TABLET | Freq: Two times a day (BID) | ORAL | Status: DC
  Administered 2018-06-17 – 2018-06-18 (×3): 25 mg via ORAL
  Filled 2018-06-17 (×3): qty 1

## 2018-06-17 MED ORDER — POTASSIUM CHLORIDE CRYS ER 20 MEQ PO TBCR
40.0000 meq | EXTENDED_RELEASE_TABLET | Freq: Once | ORAL | Status: AC
  Administered 2018-06-17: 40 meq via ORAL
  Filled 2018-06-17: qty 2

## 2018-06-17 MED ORDER — FUROSEMIDE 40 MG PO TABS
40.0000 mg | ORAL_TABLET | Freq: Every day | ORAL | Status: DC
  Administered 2018-06-17 – 2018-06-18 (×2): 40 mg via ORAL
  Filled 2018-06-17 (×2): qty 1

## 2018-06-17 MED ORDER — CLOPIDOGREL BISULFATE 75 MG PO TABS
75.0000 mg | ORAL_TABLET | Freq: Every day | ORAL | Status: DC
  Administered 2018-06-17 – 2018-06-18 (×2): 75 mg via ORAL
  Filled 2018-06-17 (×2): qty 1

## 2018-06-17 NOTE — Progress Notes (Signed)
Pacing wires pulled per protocol and per order.  VSS during and post removal.  Pt educated.  Bedrest up at 1515.  Report called to 4E RN.  Will transfer once bedrest is up.

## 2018-06-17 NOTE — Progress Notes (Signed)
06/17/2018 1645 Received pt to room 4E08 from 2H.  Pt is A&O, no C/O voiced.  Tele monitor applied and CCMD notified.  CHG completed.  Oriented to room, call light and bed.  Call bell in reach, family at bedside. Kathryne Hitch

## 2018-06-17 NOTE — Progress Notes (Addendum)
TCTS DAILY ICU PROGRESS NOTE                   [address]            [place] [address]          [phone]   3 Days Post-Op Procedure(s) (LRB): CORONARY ARTERY BYPASS GRAFTING (CABG) times 4 using left internal mammary artery and right saphenous vein using endoscope. (N/A) TRANSESOPHAGEAL ECHOCARDIOGRAM (TEE) (N/A)  Total Length of Stay:  LOS: 4 days   Subjective:  No specific complaints.  Doing okay, states today is the first day she has really eaten anything  Objective: Vital signs in last 24 hours: Temp:  [97.8 F (36.6 C)-98.3 F (36.8 C)] 97.8 F (36.6 C) (10/28 0733) Pulse Rate:  [71-88] 76 (10/28 0600) Cardiac Rhythm: Normal sinus rhythm (10/28 0733) Resp:  [11-23] 18 (10/28 0600) BP: (100-130)/(54-85) 126/75 (10/28 0733) SpO2:  [90 %-100 %] 98 % (10/28 0600) Weight:  [75.7 kg] 75.7 kg (10/28 0500)  Filed Weights   06/15/18 0500 06/16/18 0500 06/17/18 0500  Weight: 79 kg 77.6 kg 75.7 kg    Weight change: -1.865 kg   Intake/Output from previous day: 10/27 0701 - 10/28 0700 In: 363 [P.O.:360; I.V.:3] Out: 1735 [Urine:1675; Chest Tube:60]  Current Meds: Scheduled Meds: . sodium chloride   Intravenous Once  . acetaminophen  1,000 mg Oral Q6H   Or  . acetaminophen (TYLENOL) oral liquid 160 mg/5 mL  1,000 mg Per Tube Q6H  . aspirin EC  325 mg Oral Daily   Or  . aspirin  324 mg Per Tube Daily  . bisacodyl  10 mg Oral Daily   Or  . bisacodyl  10 mg Rectal Daily  . docusate sodium  200 mg Oral Daily  . furosemide  40 mg Intravenous BID  . insulin aspart  0-15 Units Subcutaneous TID WC  . insulin aspart  0-24 Units Subcutaneous TID AC & HS  . metoprolol tartrate  12.5 mg Oral BID   Or  . metoprolol tartrate  12.5 mg Per Tube BID  . pantoprazole  40 mg Oral Daily  . rosuvastatin  20 mg Oral Daily  . sodium chloride flush  3 mL Intravenous Q12H   Continuous Infusions: PRN Meds:.metoprolol tartrate, ondansetron (ZOFRAN) IV, oxyCODONE,  sodium chloride flush, traMADol  General appearance: alert, cooperative and no distress Heart: regular rate and rhythm Lungs: diminished breath sounds bibasilar Abdomen: soft, non-tender; bowel sounds normal; no masses,  no organomegaly Extremities: edema trace Wound: clean and dry, hematoma at left [facility] site  Lab Results: CBC: Recent Labs    06/16/18 0500 06/17/18 0527  WBC 10.4 12.3*  HGB 8.1* 8.5*  HCT 26.1* 27.6*  PLT 167 186   BMET:  Recent Labs    06/16/18 0500 06/17/18 0527  NA 139 138  K 4.6 3.8  CL 106 104  CO2 25 26  GLUCOSE 132* 126*  BUN 10 15  CREATININE 0.71 0.69  CALCIUM 7.1* 7.1*    CMET: Lab Results  Component Value Date   WBC 12.3 (H) 06/17/2018   HGB 8.5 (L) 06/17/2018   HCT 27.6 (L) 06/17/2018   PLT 186 06/17/2018   GLUCOSE 126 (H) 06/17/2018   CHOL 314 (H) 02/28/2018   TRIG 262 (H) 02/28/2018   HDL 54 02/28/2018   LDLCALC 208 (H) 02/28/2018   ALT 33 06/13/2018   AST 29 06/13/2018   NA 138 06/17/2018   K 3.8 06/17/2018  CL 104 06/17/2018   CREATININE 0.69 06/17/2018   BUN 15 06/17/2018   CO2 26 06/17/2018   TSH 4.270 02/25/2018   INR 1.54 06/14/2018   HGBA1C 6.1 (H) 06/13/2018      PT/INR:  Recent Labs    06/14/18 1346  LABPROT 18.3*  INR 1.54   Radiology: No results found.   Assessment/Plan: S/P Procedure(s) (LRB): CORONARY ARTERY BYPASS GRAFTING (CABG) times 4 using left internal mammary artery and right saphenous vein using endoscope. (N/A) TRANSESOPHAGEAL ECHOCARDIOGRAM (TEE) (N/A)  1. CV- Sinus Brady, BP in the 130s at times- will increase Lopressor to 25 mg BID 2. Pulm- chest tubes removed yesterday, CXR with atelectasis, off oxygen, continue IS 3. Renal-creatinine WNL, weight is minimally elevated, continue Lasix 4. Expected post operative blood loss anemia, mild Hgb at 8.5 5. CBGs controlled, patient is not a diabetic, will d/c SSIP 6. Left EVH site hematoma, will monitor 7. Dispo- patient stable, increase  BB for better HR control, transition to oral lasix, transfer to 4E     Erin Barrett 06/17/2018 7:50 AM   I have seen and examined the patient and agree with the assessment and plan as outlined.  Mobilize.  Gentle diuresis.  DAPT x6 months.  Beta blocker.  Statin.  Transfer 4E tele  [name], MD 06/17/2018 8:07 AM

## 2018-06-18 ENCOUNTER — Inpatient Hospital Stay (HOSPITAL_COMMUNITY): Payer: Medicare HMO

## 2018-06-18 LAB — BASIC METABOLIC PANEL
Anion gap: 8 (ref 5–15)
BUN: 17 mg/dL (ref 8–23)
CALCIUM: 7.6 mg/dL — AB (ref 8.9–10.3)
CO2: 29 mmol/L (ref 22–32)
Chloride: 104 mmol/L (ref 98–111)
Creatinine, Ser: 0.76 mg/dL (ref 0.44–1.00)
GFR calc Af Amer: >60 mL/min (ref >60)
GLUCOSE: 119 mg/dL — AB (ref 70–99)
Potassium: 5 mmol/L (ref 3.5–5.1)
Sodium: 141 mmol/L (ref 135–145)

## 2018-06-18 MED ORDER — TRAMADOL HCL 50 MG PO TABS: 50.0000 mg | ORAL_TABLET | ORAL | 0 refills | Status: DC | PRN

## 2018-06-18 MED ORDER — CLOPIDOGREL BISULFATE 75 MG PO TABS: 75.0000 mg | ORAL_TABLET | Freq: Every day | ORAL | 3 refills | Status: DC

## 2018-06-18 MED ORDER — METOPROLOL TARTRATE 25 MG PO TABS: 25.0000 mg | ORAL_TABLET | Freq: Two times a day (BID) | ORAL | 3 refills | Status: DC

## 2018-06-18 MED ORDER — IRON 325 (65 FE) MG PO TABS: 325.0000 mg | ORAL_TABLET | Freq: Every day | ORAL | 0 refills | Status: DC

## 2018-06-18 MED ORDER — FUROSEMIDE 40 MG PO TABS: 40.0000 mg | ORAL_TABLET | Freq: Every day | ORAL | 0 refills | Status: DC

## 2018-06-18 MED FILL — Lidocaine HCl(Cardiac) IV PF Soln Pref Syr 100 MG/5ML (2%): INTRAVENOUS | Qty: 5 | Status: AC

## 2018-06-18 MED FILL — Albumin, Human Inj 5%: INTRAVENOUS | Qty: 250 | Status: AC

## 2018-06-18 MED FILL — Mannitol IV Soln 20%: INTRAVENOUS | Qty: 500 | Status: AC

## 2018-06-18 MED FILL — Sodium Chloride IV Soln 0.9%: INTRAVENOUS | Qty: 2000 | Status: AC

## 2018-06-18 MED FILL — Electrolyte-R (PH 7.4) Solution: INTRAVENOUS | Qty: 4000 | Status: AC

## 2018-06-18 MED FILL — Sodium Bicarbonate IV Soln 8.4%: INTRAVENOUS | Qty: 50 | Status: AC

## 2018-06-18 MED FILL — Heparin Sodium (Porcine) Inj 1000 Unit/ML: INTRAMUSCULAR | Qty: 10 | Status: AC

## 2018-06-18 NOTE — Progress Notes (Signed)
CARDIOLOGY RECOMMENDATIONS:  Discharge is anticipated in the next 48 hours. Recommendations for medications and follow up:  Discharge Medications:ASA, Plavix, Lopressor, Crestor Continue medications as they are currently listed in the [facility]. Exceptions to the above: None  Follow Up: The patient's Primary Cardiologist is Parke Poisson, MD  Follow up in the office will be arranged by our team.   Signed,  Verne Carrow, MD  12:55 PM 06/18/2018  [facility]

## 2018-06-18 NOTE — Progress Notes (Signed)
Patient in a stable condition, discharge education reviewed  with patient and her husband at bedside, they verbalized understanding, iv removed , tele dc ccmd notified, patient belongings at bedside, walker for home use at bedside, patient to be tarnsported home by her husband. Chest tube sutures removed per verbal order from [place] PA.

## 2018-06-18 NOTE — Progress Notes (Signed)
Patient ambulated 259ft in the hall using a walker, ambulation well tolerated,  will monitor.

## 2018-06-18 NOTE — Progress Notes (Addendum)
CARDIAC REHAB PHASE I    D/c education completed with pt and husband. Pt educated to shower daily and monitor her incisions. Encouraged continued IS and Flutter use. Pt demonstrated 750 on IS. Pt given Cardiac Surgery booklet, in-the-tube sheet, heart healthy and diabetic diets. Reviewed restrictions and exercise guidelines. Will refer to CRP II [place].  [id number] Reynold Bowen, RN BSN 06/18/2018 3:01 PM

## 2018-06-18 NOTE — Care Management Note (Signed)
Case Management Note [name] RN, BSN Transitions of Care Unit 4E- RN Case Manager [phone]  Patient Details  Name: Jaime Chavez MRN: [id number] Date of Birth: 08/21/1941  Subjective/Objective:   Pt admitted s/p CABGx4                 Action/Plan: PTA pt lived at home with spouse- notified by bedside RN pt needs RW for home- call made to [facility] with [facility] for DME need- RW to be delivered to room prior to discharge.   Expected Discharge Date:      06/18/18            Expected Discharge Plan:  Home/Self Care  In-House Referral:  NA  Discharge planning Services  CM Consult  Post Acute Care Choice:  Durable Medical Equipment Choice offered to:  Patient  DME Arranged:  Dan Humphreys rolling DME Agency:  [facility]  HH Arranged:  NA HH Agency:  NA  Status of Service:  Completed, signed off  If discussed at Long Length of Stay Meetings, dates discussed:    Discharge Disposition: home/self care   Additional Comments:  [name], RN 06/18/2018, 1:44 PM

## 2018-06-18 NOTE — Care Management Important Message (Signed)
Important Message  Patient Details  Name: Jaime Chavez MRN: [id number] Date of Birth: May 13, 1941   Medicare Important Message Given:  Yes    [name] [name] 06/18/2018, 4:17 PM

## 2018-06-18 NOTE — Plan of Care (Signed)

## 2018-06-18 NOTE — Discharge Summary (Signed)
Physician Discharge Summary  Patient ID: Jaime Chavez MRN: [id number] DOB/AGE: 75/06/1941 77 y.o.  Admit date: 06/13/2018 Discharge date: 06/18/2018  Admission Diagnoses:  Patient Active Problem List   Diagnosis Date Noted  . Unstable angina (HCC) 06/13/2018  . Coronary artery disease involving native coronary artery with unstable angina pectoris (HCC) 06/13/2018  . UARS (upper airway resistance syndrome) 05/31/2018  . Primary sleep disturbance 05/31/2018  . Shortness of breath 05/09/2018  . New-onset angina (HCC) 05/09/2018  . Seborrheic keratosis 02/25/2018  . Seasonal allergies 02/22/2018  . Acid reflux 02/22/2018  . Hyperlipidemia 02/22/2018   Discharge Diagnoses:   Patient Active Problem List   Diagnosis Date Noted  . S/P CABG x 4 06/14/2018  . Unstable angina (HCC) 06/13/2018  . Coronary artery disease involving native coronary artery with unstable angina pectoris (HCC) 06/13/2018  . UARS (upper airway resistance syndrome) 05/31/2018  . Primary sleep disturbance 05/31/2018  . Shortness of breath 05/09/2018  . New-onset angina (HCC) 05/09/2018  . Seborrheic keratosis 02/25/2018  . Seasonal allergies 02/22/2018  . Acid reflux 02/22/2018  . Hyperlipidemia 02/22/2018   Discharged Condition: good  History of Present Illness:   Jaime Chavez is a 77 yo female with no previous history of coronary artery disease and risk factors notable only for history of hyperlipidemia.  Patient states that she has been healthy all of her adult life.  She admits to a sedentary lifestyle, and she states that she has been slowing down physically for the last few years.  She complains that she gets tired easily.  A proximally 4 months ago the patient began to experience substernal chest pressure with physical exertion.  Symptoms have increased in frequency and severity over the last several months, and recently she had 2 brief episodes of chest pressure while she was sitting down.  Last night she  had some chest pressure which awoke her from her sleep.  Symptoms occasionally radiate to her left arm and beneath her breasts and are associated with shortness of breath.  She described the symptoms to her primary care physician who gave her a prescription for sublingual nitroglycerin and referred for elective cardiology consultation.  Patient was seen yesterday by Dr. Jacques Navy and promptly scheduled for diagnostic cardiac catheterization.  Catheterization performed by Dr. Okey Dupre reveals severe three-vessel coronary artery disease with preserved left ventricular systolic function.  It was felt coronary bypass grafting would be indicated and she was admitted to the hospital for further care.  Hospital Course:   The patient remained chest pain free during hospital.  She was evaluated by Dr. Cornelius Moras for coronary bypass grafting procedure.  He was in agreement that cardiothoracic surgery was the best treatment option for the patient.  The risks and benefits of the procedure were explained to the patient and she was agreeable to proceed.  She was taken to the operating room on 06/14/2018.  She was taken to the operating room and underwent CABG x 4 utilizing LIMA to LAD, Sequential SVG to Distal RCA and PDA, and SVG to OM.  She also underwent Endoscopic harvest of greater saphenous vein from right leg and left thigh.  She tolerated the procedure without difficulty and was taken to the SICU in stable condition.  She was extubated the evening of surgery.  During her stay in the SICU the patients arterial lines and chest tubes were removed without difficulty.  She was diuresed with Lasix for hypervolemia.  She was maintaining NSR.  She was ambulating in the hallway independently  and was medically stable for transfer to the telemetry unit on 06/17/2018.  She continues to make progress.  She remains in NSR and her pacing wires were removed without difficulty.  She continues to ambulate without difficulty.  Her incisions are  healing without evidence of infection.  She is tolerating a diet.  Her pain is well controlled.  She is medically stable for discharge home today.          Significant Diagnostic Studies: angiography:   Left Main  Vessel is moderate in size.  Left Anterior Descending  Vessel is moderate in size.  Prox LAD to Mid LAD lesion 70% stenosed  Prox LAD to Mid LAD lesion is 70% stenosed.  Mid LAD lesion 90% stenosed  Mid LAD lesion is 90% stenosed.  Dist LAD lesion 50% stenosed  Dist LAD lesion is 50% stenosed.  First Diagonal Branch  Vessel is small in size.  Second Diagonal Branch  Vessel is small in size.  Third Diagonal Branch  Vessel is small in size.  Left Circumflex  Vessel is moderate in size.  Prox Cx lesion 80% stenosed  Prox Cx lesion is 80% stenosed.  First Obtuse Marginal Branch  Vessel is small in size.  Second Obtuse Marginal Branch  Vessel is moderate in size.  Right Coronary Artery  Vessel is moderate in size.  Prox RCA lesion 80% stenosed  Prox RCA lesion is 80% stenosed.  Mid RCA-1 lesion 90% stenosed  Mid RCA-1 lesion is 90% stenosed.  Mid RCA-2 lesion 60% stenosed  Mid RCA-2 lesion is 60% stenosed.  Dist RCA lesion 40% stenosed  Dist RCA lesion is 40% stenosed.  Right Posterior Descending Artery  Vessel is moderate in size.  Ost RPDA to RPDA lesion 70% stenosed  Ost RPDA to RPDA lesion is 70% stenosed.  RPDA lesion 90% stenosed  RPDA lesion is 90% stenosed.  Right Posterior Atrioventricular Branch  Vessel is moderate in size.   Treatments: surgery:    Coronary Artery Bypass Grafting x 4              Left Internal Mammary Artery to Distal Left Anterior Descending Coronary Artery             Saphenous Vein Graft to Distal Right Coronary Artery             Sequential Saphenous Vein Graft to Posterior Descending Coronary Artery             Saphenous Vein Graft to Obtuse Marginal Branch of Left Circumflex Coronary Artery             Endoscopic Vein  Harvest from Bilateral Thighs and Right Lower Leg  Discharge Exam: Blood pressure (!) 120/56, pulse 94, temperature 98.4 F (36.9 C), temperature source Oral, resp. rate 16, height 5\' 3"  (1.6 m), weight 75.3 kg, SpO2 96 %.    General appearance: alert, cooperative and no distress Heart: regular rate and rhythm Lungs: diminished breath sounds bibasilar Abdomen: soft, non-tender; bowel sounds normal; no masses,  no organomegaly Extremities: edema trace, ecchymosis BLE Wound: clean and dry   Disposition: Home  Discharge Medications:  The patient has been discharged on:   1.Beta Blocker:  Yes [ x  ]                              No   [   ]  If No, reason:  2.Ace Inhibitor/ARB: Yes [   ]                                     No  [  x  ]                                     If No, reason: labile BP  3.Statin:   Yes [ x  ]                  No  [   ]                  If No, reason:  4.Ecasa:  Yes  [ x  ]                  No   [   ]                  If No, reason:     Discharge Instructions    Amb Referral to Cardiac Rehabilitation   Complete by:  As directed    Diagnosis:  CABG   CABG X ___:  4     Allergies as of 06/18/2018      Reactions   Osteoporosis Support [a-g Pro] Other (See Comments)   Patient that she started having severe pain and back spasms.    Alendronate Sodium Other (See Comments)   Back pain, muscle spasms      Medication List    STOP taking these medications   nitroGLYCERIN 0.4 MG SL tablet Commonly known as:  NITROSTAT     TAKE these medications   acetaminophen 325 MG tablet Commonly known as:  TYLENOL Take 650 mg by mouth every 6 (six) hours as needed.   aspirin EC 81 MG tablet Take 1 tablet (81 mg total) by mouth daily.   clopidogrel 75 MG tablet Commonly known as:  PLAVIX Take 1 tablet (75 mg total) by mouth daily.   fluticasone 50 MCG/ACT nasal spray Commonly known as:  FLONASE Place 1 spray into both  nostrils daily. 1 spray in each nostril every day What changed:    when to take this  reasons to take this  additional instructions   furosemide 40 MG tablet Commonly known as:  LASIX Take 1 tablet (40 mg total) by mouth daily.   Iron 325 (65 Fe) MG Tabs Take 1 tablet (325 mg total) by mouth daily.   KLS ALLER-TEC PO Take 10 mg by mouth daily as needed (allergies).   metoprolol tartrate 25 MG tablet Commonly known as:  LOPRESSOR Take 1 tablet (25 mg total) by mouth 2 (two) times daily.   ranitidine 150 MG capsule Commonly known as:  ZANTAC Take 150 mg by mouth daily as needed for heartburn.   rosuvastatin 20 MG tablet Commonly known as:  CRESTOR Take 1 tablet (20 mg total) by mouth daily.   traMADol 50 MG tablet Commonly known as:  ULTRAM Take 1-2 tablets (50-100 mg total) by mouth every 4 (four) hours as needed for moderate pain.            Durable Medical Equipment  (From admission, onward)         Start     Ordered   06/18/18 1259  For  home use only DME Walker rolling  Once    Question:  Patient needs a walker to treat with the following condition  Answer:  Weakness generalized   06/18/18 1258         Follow-up Information    [name], MD Follow up on 07/22/2018.   Specialty:  Cardiothoracic Surgery Why:  Appointment is at 1:30, please get CXR at 1:00 at [facility] located on first floor of our office building Contact information: [address] [place] [address] [phone]        [facility] - Dme Follow up.   Why:  rolling walker arranged- to be delivered to room prior to discharge Contact information: [address] [place] [address] [phone]        Parke Poisson, MD Follow up on 07/09/2018.   Specialty:  Cardiology Why:  Appointment is at 1:40 Contact information: [address] [place] [address] [phone]           Signed: Lowella Dandy 06/18/2018, 5:53 PM

## 2018-06-18 NOTE — Discharge Instructions (Signed)

## 2018-06-18 NOTE — Progress Notes (Signed)
      [address]       [place] [address]             [phone]      4 Days Post-Op Procedure(s) (LRB): CORONARY ARTERY BYPASS GRAFTING (CABG) times 4 using left internal mammary artery and right saphenous vein using endoscope. (N/A) TRANSESOPHAGEAL ECHOCARDIOGRAM (TEE) (N/A)   Subjective:  No new complaints.  Not using IS much as its difficult to use.   She states this was difficult prior to surgery.  +ambulation  + BM  Objective: Vital signs in last 24 hours: Temp:  [97.8 F (36.6 C)-98.8 F (37.1 C)] 98.1 F (36.7 C) (10/29 0724) Pulse Rate:  [74-86] 74 (10/28 2300) Cardiac Rhythm: Normal sinus rhythm (10/29 0700) Resp:  [11-24] 14 (10/29 0724) BP: (115-133)/(57-79) 133/72 (10/29 0724) SpO2:  [92 %-97 %] 96 % (10/29 0435) Weight:  [75.3 kg] 75.3 kg (10/29 0435)  General appearance: alert, cooperative and no distress Heart: regular rate and rhythm Lungs: diminished breath sounds bibasilar Abdomen: soft, non-tender; bowel sounds normal; no masses,  no organomegaly Extremities: edema trace, ecchymosis BLE Wound: clean and dry  Lab Results: Recent Labs    06/16/18 0500 06/17/18 0527  WBC 10.4 12.3*  HGB 8.1* 8.5*  HCT 26.1* 27.6*  PLT 167 186   BMET:  Recent Labs    06/17/18 0527 06/18/18 0258  NA 138 141  K 3.8 5.0  CL 104 104  CO2 26 29  GLUCOSE 126* 119*  BUN 15 17  CREATININE 0.69 0.76  CALCIUM 7.1* 7.6*    PT/INR: No results for input(s): LABPROT, INR in the last 72 hours. ABG    Component Value Date/Time   PHART 7.368 06/15/2018 0320   HCO3 20.1 06/15/2018 0320   TCO2 24 06/15/2018 1741   ACIDBASEDEF 4.3 (H) 06/15/2018 0320   O2SAT 97.9 06/15/2018 0320   CBG (last 3)  Recent Labs    06/16/18 2117 06/17/18 0634 06/17/18 1130  GLUCAP 134* 137* 149*    Assessment/Plan: S/P Procedure(s) (LRB): CORONARY ARTERY BYPASS GRAFTING (CABG) times 4 using left internal mammary artery and right saphenous vein using endoscope.  (N/A) TRANSESOPHAGEAL ECHOCARDIOGRAM (TEE) (N/A)  1. CV- NSR, BP improved- continue Lopressor 2. Pulm- no acute issues, CXR looks good, will order flutter valve which may be easier for her to use 3. Renal- creatinine WNL, weight is trending down, continue Lasix, K is 5.0 4. Left EVH hematoma- improving, not quite as hard today, still no signs of infection 5. Dispo- patient stable, continue Lopressor, add flutter valve, possible for discharge home today vs tomorrow   LOS: 5 days    Lowella Dandy 06/18/2018

## 2018-06-19 ENCOUNTER — Telehealth (HOSPITAL_COMMUNITY): Payer: Self-pay

## 2018-06-19 ENCOUNTER — Other Ambulatory Visit: Payer: Self-pay | Admitting: *Deleted

## 2018-06-19 DIAGNOSIS — G8918 Other acute postprocedural pain: Secondary | ICD-10-CM

## 2018-06-19 MED ORDER — TRAMADOL HCL 50 MG PO TABS: 50.0000 mg | ORAL_TABLET | ORAL | 0 refills | Status: DC | PRN

## 2018-06-19 NOTE — Telephone Encounter (Signed)
Called and spoke with husband of pt in regards to CR - Pt is interested in the CR program. Explained scheduling process and went over insurance, husband verbalized understanding. Will contact pt for scheduling once f/u appt has been completed.

## 2018-06-19 NOTE — Telephone Encounter (Signed)
Pt's insurance is active and benefits verified through [employer] - $40.00 co-pay, no deductible, out of pocket amount of $2,000/$331.34 has been met, no co-insurance, and no pre-authorization is required. Passport/reference [id number]  Will make initial call to pt in regards to CR. If interested, pt will need to complete follow up appt. Once completed, pt will be contacted for scheduling.

## 2018-06-21 ENCOUNTER — Telehealth: Payer: Self-pay

## 2018-06-21 NOTE — Telephone Encounter (Signed)
Patient's husband contacted the office and stated that his wife has been unsuccessful in producing a bowel movement since she came home from the hospital on 06/18/18.  Requested what she could take.  Advised stool softener, such as docusate sodium or over the counter Miralax.  He acknowledged receipt.

## 2018-06-24 ENCOUNTER — Ambulatory Visit: Payer: Medicare HMO | Admitting: Internal Medicine

## 2018-06-25 ENCOUNTER — Telehealth: Payer: Self-pay

## 2018-06-25 NOTE — Telephone Encounter (Signed)
Patient's husband contacted the office with medication questions.  He asked if she was to take more than the 7 days of furosemide that was prescribed.  I advised that she was to only take the 7 days worth of medication.  He acknowledged receipt.  He also stated that her left leg where the [facility] site was taken was black.  I advised that this is normal as some tend to bruise easily.  He acknowledged receipt and thanked me for the call.

## 2018-07-08 ENCOUNTER — Institutional Professional Consult (permissible substitution): Payer: Self-pay | Admitting: Neurology

## 2018-07-09 ENCOUNTER — Encounter: Payer: Self-pay | Admitting: Internal Medicine

## 2018-07-09 ENCOUNTER — Ambulatory Visit: Payer: Medicare HMO | Admitting: Internal Medicine

## 2018-07-09 VITALS — BP 110/70 | HR 82 | Ht 63.0 in | Wt 159.6 lb

## 2018-07-09 DIAGNOSIS — I2510 Atherosclerotic heart disease of native coronary artery without angina pectoris: Secondary | ICD-10-CM | POA: Diagnosis not present

## 2018-07-09 DIAGNOSIS — E782 Mixed hyperlipidemia: Secondary | ICD-10-CM | POA: Diagnosis not present

## 2018-07-09 DIAGNOSIS — I25110 Atherosclerotic heart disease of native coronary artery with unstable angina pectoris: Secondary | ICD-10-CM | POA: Diagnosis not present

## 2018-07-09 DIAGNOSIS — Z951 Presence of aortocoronary bypass graft: Secondary | ICD-10-CM

## 2018-07-09 NOTE — Patient Instructions (Signed)
Medication Instructions:  Your physician recommends that you continue on your current medications as directed. Please refer to the Current Medication list given to you today.  If you need a refill on your cardiac medications before your next appointment, please call your pharmacy.   Lab work: Cbc today If you have labs (blood work) drawn today and your tests are completely normal, you will receive your results only by: Marland Kitchen. MyChart Message (if you have MyChart) OR . A paper copy in the mail If you have any lab test that is abnormal or we need to change your treatment, we will call you to review the results.  Testing/Procedures: None ordered  Follow-Up: [facility], you and your health needs are our priority.  As part of our continuing mission to provide you with exceptional heart care, we have created designated Provider Care Teams.  These Care Teams include your primary Cardiologist (physician) and Advanced Practice Providers (APPs -  Physician Assistants and Nurse Practitioners) who all work together to provide you with the care you need, when you need it. You will need a follow up appointment in 3 months.   You may see Parke PoissonGayatri A Acharya, MD or one of the following Advanced Practice Providers on your designated Care Team:   Theodore DemarkRhonda Barrett, PA-C . Joni ReiningKathryn Lawrence, DNP, ANP  Any Other Special Instructions Will Be Listed Below (If Applicable).

## 2018-07-10 ENCOUNTER — Telehealth (HOSPITAL_COMMUNITY): Payer: Self-pay

## 2018-07-10 ENCOUNTER — Telehealth: Payer: Self-pay

## 2018-07-10 LAB — CBC WITH DIFFERENTIAL/PLATELET
BASOS ABS: 0.1 10*3/uL (ref 0.0–0.2)
Basos: 1 %
EOS (ABSOLUTE): 0.3 10*3/uL (ref 0.0–0.4)
Eos: 3 %
HEMATOCRIT: 38.3 % (ref 34.0–46.6)
HEMOGLOBIN: 12.1 g/dL (ref 11.1–15.9)
IMMATURE GRANS (ABS): 0 10*3/uL (ref 0.0–0.1)
Immature Granulocytes: 0 %
LYMPHS: 22 %
Lymphocytes Absolute: 1.7 10*3/uL (ref 0.7–3.1)
MCH: 27.9 pg (ref 26.6–33.0)
MCHC: 31.6 g/dL (ref 31.5–35.7)
MCV: 88 fL (ref 79–97)
MONOCYTES: 9 %
Monocytes Absolute: 0.7 10*3/uL (ref 0.1–0.9)
NEUTROS PCT: 65 %
Neutrophils Absolute: 5 10*3/uL (ref 1.4–7.0)
Platelets: 443 10*3/uL (ref 150–450)
RBC: 4.34 x10E6/uL (ref 3.77–5.28)
RDW: 16.9 % — ABNORMAL HIGH (ref 12.3–15.4)
WBC: 7.7 10*3/uL (ref 3.4–10.8)

## 2018-07-10 NOTE — Telephone Encounter (Signed)
Pt aware of lab results with verbal understanding.  Pt wants to know if she needs to continue the Iron supplement. Adv pt that I will discuss with Dr.Acharya and call her back.  Adv pt per Dr. Jacques NavyAcharya she can d/c Iron. Pt mentioned during her 11/19 o/v that she was having dark stools. Ad pt that if the dark stools continue after stopping the Iron. So should call and update Dr.Acharya. Pt agreeable and verbalized understanding.

## 2018-07-10 NOTE — Telephone Encounter (Signed)
Called patient to see if she was interested in participating in the Cardiac Rehab Program. Patient stated yes. Patient will come in for orientation on 09/05/2018 @ 1:30PM and will attend the 11:15AM exercise class.  Mailed homework package.  went over insurance, patient verbalized understanding.

## 2018-07-10 NOTE — Telephone Encounter (Signed)
-----   Message from Parke PoissonGayatri A Acharya, MD sent at 07/10/2018  4:18 PM EST ----- Normal hemoglobin.

## 2018-07-17 ENCOUNTER — Other Ambulatory Visit: Payer: Self-pay | Admitting: *Deleted

## 2018-07-17 DIAGNOSIS — Z951 Presence of aortocoronary bypass graft: Secondary | ICD-10-CM

## 2018-07-22 ENCOUNTER — Encounter: Payer: Self-pay | Admitting: Thoracic Surgery (Cardiothoracic Vascular Surgery)

## 2018-07-22 ENCOUNTER — Ambulatory Visit
Admission: RE | Admit: 2018-07-22 | Discharge: 2018-07-22 | Disposition: A | Discharge status: Home / Self Care | Payer: Medicare HMO | Source: Ambulatory Visit | Attending: Thoracic Surgery (Cardiothoracic Vascular Surgery) | Admitting: Thoracic Surgery (Cardiothoracic Vascular Surgery)

## 2018-07-22 ENCOUNTER — Ambulatory Visit (INDEPENDENT_AMBULATORY_CARE_PROVIDER_SITE_OTHER): Payer: Self-pay | Admitting: Thoracic Surgery (Cardiothoracic Vascular Surgery)

## 2018-07-22 VITALS — BP 124/64 | HR 84 | Resp 20 | Ht 63.0 in | Wt 159.0 lb

## 2018-07-22 DIAGNOSIS — Z951 Presence of aortocoronary bypass graft: Secondary | ICD-10-CM

## 2018-07-22 DIAGNOSIS — R918 Other nonspecific abnormal finding of lung field: Secondary | ICD-10-CM | POA: Diagnosis not present

## 2018-07-22 NOTE — Progress Notes (Signed)
Cardiology Office Note:    Date:  07/22/2018   ID:  Jaime Chavez, DOB [birth year], MRN [id number]  PCP:  Leland Her, DO  Cardiologist:  Parke Poisson, MD  Electrophysiologist:  None   Referring MD: Leland Her, DO   Post- CABG follow up  History of Present Illness:    Jaime Chavez is a 77 y.o. female with a hx of unstable angina s/p coronary angiogram revealing severe three vessel coronary artery disease. CABG was performed on 06/14/18 CABG x 4 utilizing LIMA to LAD, Sequential SVG to Distal RCA and PDA, and SVG to OM.  She describes mild fatigue and shortness of breath, with mild chest discomfort with activity. She describes dark stools when asked, which started after iron supplementation was initiated.   Since hospital discharge she has done well. She denies dyspnea at rest, palpitations, PND, orthopnea, or leg swelling. Denies syncope or presyncope. Denies dizziness or lightheadedness.   She had normal biventricular function on preoperative echo. Normal postoperative TEE.   Cardiovascular medication reviewed. Activity and dietary restrictions reviewed.   Past Medical History:  Diagnosis Date  . Acid reflux   . Coronary artery disease involving native coronary artery of native heart with angina pectoris (HCC) 06/13/2018  . Dyspnea   . Environmental allergies   . Hyperlipidemia 2014  . Left patella fracture 2005  . S/P CABG x 4 06/14/2018   LIMA to LAD, SVG to OM, Sequential SVG to RCA and PDA, EVH from bilateral thighs and RLL    Past Surgical History:  Procedure Laterality Date  . CARDIAC CATHETERIZATION  06/13/2018  . CORONARY ARTERY BYPASS GRAFT N/A 06/14/2018   Procedure: CORONARY ARTERY BYPASS GRAFTING (CABG) times 4 using left internal mammary artery and right saphenous vein using endoscope.;  Surgeon: [name], MD;  Location: [facility] OR;  Service: Open Heart Surgery;  Laterality: N/A;  . DILATION AND CURETTAGE, DIAGNOSTIC / THERAPEUTIC     for nonmalignant  polyp  . RIGHT/LEFT HEART CATH AND CORONARY ANGIOGRAPHY N/A 06/13/2018   Procedure: RIGHT/LEFT HEART CATH AND CORONARY ANGIOGRAPHY;  Surgeon: [name], MD;  Location: [facility] INVASIVE CV LAB;  Service: Cardiovascular;  Laterality: N/A;  . TEE WITHOUT CARDIOVERSION N/A 06/14/2018   Procedure: TRANSESOPHAGEAL ECHOCARDIOGRAM (TEE);  Surgeon: [name], MD;  Location: [facility] OR;  Service: Open Heart Surgery;  Laterality: N/A;    Current Medications: Current Meds  Medication Sig  . acetaminophen (TYLENOL) 325 MG tablet Take 650 mg by mouth every 6 (six) hours as needed.  Marland Kitchen aspirin EC 81 MG tablet Take 1 tablet (81 mg total) by mouth daily.  . clopidogrel (PLAVIX) 75 MG tablet Take 1 tablet (75 mg total) by mouth daily.  . furosemide (LASIX) 40 MG tablet Take 1 tablet (40 mg total) by mouth daily.  . metoprolol tartrate (LOPRESSOR) 25 MG tablet Take 1 tablet (25 mg total) by mouth 2 (two) times daily.  . traMADol (ULTRAM) 50 MG tablet Take by mouth every 6 (six) hours as needed.  . [DISCONTINUED] Ferrous Sulfate (IRON) 325 (65 Fe) MG TABS Take 1 tablet (325 mg total) by mouth daily.     Allergies:   Osteoporosis support [a-g pro] and Alendronate sodium   Social History   Socioeconomic History  . Marital status: Married    Spouse name: Not on file  . Number of children: Not on file  . Years of education: high school graduate  . Highest education level: Not on file  Occupational History  .  Occupation: retired  Engineer, productionocial Needs  . Financial resource strain: Not on file  . Food insecurity:    Worry: Not on file    Inability: Not on file  . Transportation needs:    Medical: Not on file    Non-medical: Not on file  Tobacco Use  . Smoking status: Never Smoker  . Smokeless tobacco: Never Used  Substance and Sexual Activity  . Alcohol use: Yes    Comment: wine and liquor, about 1 drink per day  . Drug use: Never  . Sexual activity: Not on file  Lifestyle  . Physical activity:     Days per week: Not on file    Minutes per session: Not on file  . Stress: Not on file  Relationships  . Social connections:    Talks on phone: Not on file    Gets together: Not on file    Attends religious service: Not on file    Active member of club or organization: Not on file    Attends meetings of clubs or organizations: Not on file    Relationship status: Not on file  Other Topics Concern  . Not on file  Social History Narrative   Lives with husband Reggie and dog named Hachi.   Religious or personal believes: [employer] Kn[employer]   Has an advance directive, would want her husband to make medical decisions for her if she were unable to do so.   Does not exercise regularly.  Does walk sometimes.   For fun she likes to eat out, travel, spent time with family.     Family History: The patient's family history includes Breast cancer in her paternal aunt; Colon cancer in her sister; Heart attack in her father; Other in her father; Pancreatic cancer in her mother.  ROS:   Please see the history of present illness.    All other systems reviewed and are negative.  EKGs/Labs/Other Studies Reviewed:    The following studies were reviewed today:  EKG:  EKG is ordered today.  The ekg ordered today demonstrates NSR with nonspecific ST abnormality in leads I, aVL.  Recent Labs: 02/25/2018: TSH 4.270 06/13/2018: ALT 33 06/15/2018: Magnesium 2.4 06/18/2018: BUN 17; Creatinine, Ser 0.76; Potassium 5.0; Sodium 141 07/09/2018: Hemoglobin 12.1; Platelets 443  Recent Lipid Panel    Component Value Date/Time   CHOL 314 (H) 02/28/2018 0840   TRIG 262 (H) 02/28/2018 0840   HDL 54 02/28/2018 0840   CHOLHDL 5.8 (H) 02/28/2018 0840   LDLCALC 208 (H) 02/28/2018 0840    Physical Exam:    VS:  BP 110/70   Pulse 82   Ht 5\' 3"  (1.6 m)   Wt 159 lb 9.6 oz (72.4 kg)   BMI 28.27 kg/m     Wt Readings from Last 3 Encounters:  07/09/18 159 lb 9.6 oz (72.4 kg)  06/18/18 166 lb 1.6 oz (75.3 kg)    06/12/18 163 lb 9.6 oz (74.2 kg)     Constitutional: No acute distress Eyes: pupils equally round and reactive to light, sclera non-icteric, normal conjunctiva and lids ENMT: normal dentition, moist mucous membranes CHEST: Well healing sternotomy Cardiovascular: regular rhythm, normal rate, no murmurs. S1 and S2 normal. Radial pulses normal bilaterally. No jugular venous distention.  Respiratory: clear to auscultation bilaterally GI : normal bowel sounds, soft and nontender. No distention.   MSK: extremities warm, well perfused. Trace edema.  NEURO: grossly nonfocal exam, moves all extremities. PSYCH: alert and oriented x 3, normal mood and affect.  ASSESSMENT:    1. S/P CABG x 4   2. Mixed hyperlipidemia   3. Coronary artery disease involving native coronary artery of native heart without angina pectoris    PLAN:    She is recovering well overall. We will check a CBC since she endorses fatigue and shortness of breath. She continues on DAPT for 6 months per CV surgery, and continues on statin, metoprolol, and furosemide.   I will see her in follow up in 3 months to continue to evaluate her recovery.  I will follow up results of CBC to ensure dark stools are not related to blood loss while on DAPT. No additional medication therapy added today.   Medication Adjustments/Labs and Tests Ordered: Current medicines are reviewed at length with the patient today.  Concerns regarding medicines are outlined above.  Orders Placed This Encounter  Procedures  . CBC with Differential  . EKG 12-Lead   No orders of the defined types were placed in this encounter.   Patient Instructions  Medication Instructions:  Your physician recommends that you continue on your current medications as directed. Please refer to the Current Medication list given to you today.  If you need a refill on your cardiac medications before your next appointment, please call your pharmacy.   Lab work: Cbc  today If you have labs (blood work) drawn today and your tests are completely normal, you will receive your results only by: Marland Kitchen MyChart Message (if you have MyChart) OR . A paper copy in the mail If you have any lab test that is abnormal or we need to change your treatment, we will call you to review the results.  Testing/Procedures: None ordered  Follow-Up: At [facility], you and your health needs are our priority.  As part of our continuing mission to provide you with exceptional heart care, we have created designated Provider Care Teams.  These Care Teams include your primary Cardiologist (physician) and Advanced Practice Providers (APPs -  Physician Assistants and Nurse Practitioners) who all work together to provide you with the care you need, when you need it. You will need a follow up appointment in 3 months.   You may see Parke Poisson, MD or one of the following Advanced Practice Providers on your designated Care Team:   Theodore Demark, PA-C . Joni Reining, DNP, ANP  Any Other Special Instructions Will Be Listed Below (If Applicable).        Signed, Parke Poisson, MD  07/22/2018 6:46 AM    [facility]

## 2018-07-22 NOTE — Progress Notes (Signed)
[address]       [place] [address]             [phone]     CARDIOTHORACIC SURGERY OFFICE NOTE  Referring Provider is End, Cristal Deer, MD  Primary Cardiologist is Parke Poisson, MD PCP is Leland Her, DO   HPI:  Patient is a 77 year old female who returns to the office today for routine follow-up status post coronary artery bypass grafting x4 on June 14, 2018 for severe three-vessel coronary artery disease with unstable angina pectoris.  The patient's early postoperative recovery in the hospital was uneventful and she was discharged home on the fourth postoperative day.  Since hospital discharge she was seen in follow-up by Dr. Jacques Navy on July 09, 2018 and she returns to our office today for routine follow-up.  She reports that overall she is doing well.  She states that she still gets short of breath with activity but it seems to be slowly improving.  She reports some mild pressure and soreness across her chest that is worse with movement.  She has not been taking any sort of pain relievers.  Appetite is good.  She has no dizzy spells.   Current Outpatient Medications  Medication Sig Dispense Refill  . acetaminophen (TYLENOL) 325 MG tablet Take 650 mg by mouth every 6 (six) hours as needed.    Marland Kitchen aspirin EC 81 MG tablet Take 1 tablet (81 mg total) by mouth daily. 60 tablet 2  . clopidogrel (PLAVIX) 75 MG tablet Take 1 tablet (75 mg total) by mouth daily. 30 tablet 3  . furosemide (LASIX) 40 MG tablet Take 1 tablet (40 mg total) by mouth daily. 7 tablet 0  . metoprolol tartrate (LOPRESSOR) 25 MG tablet Take 1 tablet (25 mg total) by mouth 2 (two) times daily. 60 tablet 3  . rosuvastatin (CRESTOR) 20 MG tablet Take 1 tablet (20 mg total) by mouth daily. 90 tablet 3  . traMADol (ULTRAM) 50 MG tablet Take by mouth every 6 (six) hours as needed.     No current facility-administered medications for this visit.       Physical Exam:   BP 124/64    Pulse 84   Resp 20   Ht 5\' 3"  (1.6 m)   Wt 159 lb (72.1 kg)   SpO2 98% Comment: RA  BMI 28.17 kg/m   General:  Well-appearing  Chest:   Clear to auscultation, slightly diminished breath sounds left lung base  CV:   Regular rate and rhythm without murmur  Incisions:  Healing nicely, sternum is stable  Abdomen:  Soft nontender  Extremities:  Warm and well-perfused, no edema  Diagnostic Tests:  CHEST - 2 VIEW  COMPARISON:  Chest x-rays dated 06/18/2018, 06/17/2018 and 06/16/2018.  FINDINGS: Increased opacity at the LEFT lung base, likely atelectasis and pleural effusion. Mild chronic scarring at the RIGHT lung base. No pneumothorax seen. Heart size and mediastinal contours are stable. Median sternotomy wires appear intact and stable in alignment. No acute or suspicious osseous finding.  IMPRESSION: Increased opacity at the LEFT lung base, likely a combination atelectasis and pleural effusion, suspect largest component is a small to moderate pleural effusion.   Electronically Signed   By: Bary Richard M.D.   On: 07/22/2018 13:09    Impression:  Patient is doing well approximately 1 month status post coronary artery bypass grafting.  Plan:  I have encouraged the patient to continue to gradually increase her  physical activity with her primary limitations remaining that she refrain from heavy lifting or strenuous use of her arms or shoulders for at least another 2 months.  I think she may resume driving an automobile and I have encouraged her to get started in the cardiac rehab program as soon as practical.  We have not recommended any changes in her medications.  I have discussed findings on her chest x-ray performed today which include left lower lobe atelectasis and possibly a small left pleural effusion.  Overall it appears that the left diaphragm is slightly elevated there is probably not much fluid.  We will have the patient return in approximately 6 weeks with a  repeat chest x-ray.  She will call and return sooner if she develops worsening shortness of breath.  All of her questions have been answered.    Salvatore Decentlarence H. Cornelius Moraswen, MD 07/22/2018 1:42 PM

## 2018-07-22 NOTE — Patient Instructions (Signed)
Continue all previous medications without any changes at this time  Continue to avoid any heavy lifting or strenuous use of your arms or shoulders for at least a total of three months from the time of surgery.  After three months you may gradually increase how much you lift or otherwise use your arms or chest as tolerated, with limits based upon whether or not activities lead to the return of significant discomfort.  You may return to driving an automobile as long as you are no longer requiring oral narcotic pain relievers during the daytime.  It would be wise to start driving only short distances during the daylight and gradually increase from there as you feel comfortable.  You are encouraged to enroll and participate in the outpatient cardiac rehab program beginning as soon as practical.  

## 2018-07-30 ENCOUNTER — Telehealth: Payer: Self-pay

## 2018-07-30 NOTE — Telephone Encounter (Signed)
We have attempted to call the patient two times to schedule sleep study.  Patient has been unavailable at the phone numbers we have on file and has not returned our calls.  At this point we will send a letter asking patient to please contact the sleep lab to schedule their sleep study.  If patient calls back we will schedule them for their sleep study. 

## 2018-08-28 ENCOUNTER — Telehealth (HOSPITAL_COMMUNITY): Payer: Self-pay

## 2018-08-28 NOTE — Telephone Encounter (Signed)
Cardiac Rehab Medication Review by a Pharmacist  Does the patient feel that his/her medications are working for him/her?  yes  Has the patient been experiencing any side effects to the medications prescribed?  no  Does the patient measure his/her own blood pressure or blood glucose at home?  no   Does the patient have any problems obtaining medications due to transportation or finances?   no  Understanding of regimen: good Understanding of indications: good Potential of compliance: good  Pharmacist comments:  She has been complaining of chest pain (5/10 pain score) almost every day since the surgery and the pain resolves when she reduces her activity level. She states Dr. Cornelius Moras is aware of this and informed her this is normal after surgery. Instructed patient to contact us if this worsens or persists and to follow up with her next cardiology appointment on 1/13.  She is unsure if she should still be taking furosemide. She has not been taking this, although it is still listed on her medication list.   Danae Orleans, PharmD PGY1 Pharmacy Resident Phone [phone] 08/28/2018       1:29 PM

## 2018-08-30 ENCOUNTER — Other Ambulatory Visit: Payer: Self-pay | Admitting: Thoracic Surgery (Cardiothoracic Vascular Surgery)

## 2018-08-30 DIAGNOSIS — J90 Pleural effusion, not elsewhere classified: Secondary | ICD-10-CM

## 2018-09-02 ENCOUNTER — Ambulatory Visit (INDEPENDENT_AMBULATORY_CARE_PROVIDER_SITE_OTHER): Payer: Self-pay | Admitting: Thoracic Surgery (Cardiothoracic Vascular Surgery)

## 2018-09-02 ENCOUNTER — Ambulatory Visit
Admission: RE | Admit: 2018-09-02 | Discharge: 2018-09-02 | Disposition: A | Discharge status: Home / Self Care | Payer: Medicare HMO | Source: Ambulatory Visit | Attending: Thoracic Surgery (Cardiothoracic Vascular Surgery) | Admitting: Thoracic Surgery (Cardiothoracic Vascular Surgery)

## 2018-09-02 VITALS — BP 140/77 | HR 80 | Resp 20 | Ht 63.0 in | Wt 158.0 lb

## 2018-09-02 DIAGNOSIS — J90 Pleural effusion, not elsewhere classified: Secondary | ICD-10-CM

## 2018-09-02 DIAGNOSIS — Z951 Presence of aortocoronary bypass graft: Secondary | ICD-10-CM

## 2018-09-02 NOTE — Patient Instructions (Addendum)
Continue all previous medications without any changes at this time  You are encouraged to participate in the outpatient cardiac rehab program beginning as soon as practical  Continue to avoid any heavy lifting or strenuous use of your arms or shoulders for at least a total of three months from the time of surgery.  After three months you may gradually increase how much you lift or otherwise use your arms or chest as tolerated, with limits based upon whether or not activities lead to the return of significant discomfort.  Contact your cardiologist if you have chest pain with physical exertion similar to that which you experienced prior to surgery, particularly if it resolves quickly with use of nitroglycerine.

## 2018-09-02 NOTE — Progress Notes (Signed)
[address]       [place] [id number]             [phone]     CARDIOTHORACIC SURGERY OFFICE NOTE  Referring Provider is End, Cristal Deerhristopher, MD  Primary Cardiologist is Parke PoissonAcharya, Gayatri A, MD PCP is Leland HerYoo, Elsia J, DO   HPI:  Patient is a 78 year old female who returns to the office today for routine follow-up status post coronary artery bypass grafting x4 on June 14, 2018 for severe three-vessel coronary artery disease with unstable angina pectoris.  The patient's early postoperative recovery in the hospital was uneventful and she was discharged home on the fourth postoperative day.  Since hospital discharge she was seen in follow-up by Dr. Jacques NavyAcharya on July 09, 2018 and she was last seen here in our office on July 22, 2018 at which time she was doing well.  Chest x-ray performed at that time did reveal a small left pleural effusion.  She returns to the office today for follow-up and repeat chest x-ray.  She states that 2 or 3 weeks ago both she and her husband developed an upper respiratory tract infection associated with a cough.  The symptoms have resolved.  She denies any shortness of breath.  She is sleeping well at night.  She is fairly active physically and she plans to get started in the cardiac rehab program next week.  She reports that she occasionally has some problems with dull aching pain in her mid chest that occasionally radiates down both arms.  The symptoms occur at night when she is trying to sleep and do not occur with physical activity.  Symptoms are usually promptly relieved with pain pills.  She has not had any exertional chest pain or chest tightness.  She has not had any exertional shortness of breath.   Current Outpatient Medications  Medication Sig Dispense Refill  . acetaminophen (TYLENOL) 325 MG tablet Take 650 mg by mouth every 6 (six) hours as needed.    Marland Kitchen. aspirin EC 81 MG tablet Take 1 tablet (81 mg total) by mouth daily. 60 tablet  2  . calcium gluconate 500 MG tablet Take 1 tablet by mouth 3 (three) times daily.    . clopidogrel (PLAVIX) 75 MG tablet Take 1 tablet (75 mg total) by mouth daily. 30 tablet 3  . fluticasone (FLONASE) 50 MCG/ACT nasal spray Place into both nostrils daily.    Marland Kitchen. glucosamine-chondroitin 500-400 MG tablet Take 1 tablet by mouth 3 (three) times daily.    Marland Kitchen. ibuprofen (ADVIL,MOTRIN) 200 MG tablet Take 200 mg by mouth every 6 (six) hours as needed.    . metoprolol tartrate (LOPRESSOR) 25 MG tablet Take 1 tablet (25 mg total) by mouth 2 (two) times daily. 60 tablet 3  . rosuvastatin (CRESTOR) 20 MG tablet Take 1 tablet (20 mg total) by mouth daily. 90 tablet 3  . traMADol (ULTRAM) 50 MG tablet Take by mouth every 6 (six) hours as needed.     No current facility-administered medications for this visit.       Physical Exam:   BP 140/77   Pulse 80   Resp 20   Ht 5\' 3"  (1.6 m)   Wt 158 lb (71.7 kg)   SpO2 97% Comment: RA  BMI 27.99 kg/m   General:  Well-appearing  Chest:   Clear to auscultation  CV:   Regular rate and rhythm  Incisions:  Healing nicely, palpation of sternum does elicit some  mild discomfort  Abdomen:  Soft nontender  Extremities:  Warm and well-perfused  Diagnostic Tests:  CHEST - 2 VIEW  COMPARISON:  July 22, 2018  FINDINGS: No pneumothorax. The sternotomy wires are intact. The heart, hila, mediastinum are normal. There is a small left pleural effusion, smaller since July 22, 2018. No pulmonary nodules or masses. No focal infiltrates. No other acute abnormalities.  IMPRESSION: The left-sided pleural effusion remains but is much smaller compared to July 22, 2018. No other acute abnormalities.   Electronically Signed   By: Gerome Samavid  Williams III M.D   On: 09/02/2018 14:51   Impression:  Patient seems to be doing well approximately 6969-month status post coronary artery bypass grafting.  She does still have some discomfort in her chest that seems to  be musculoskeletal and not related to physical exertion.  Her follow-up chest x-ray looks quite good with no significant residual pleural effusion.   Plan:  I have encouraged the patient to continue to gradually increase her physical activity as tolerated but to refrain from any sort of heavy lifting or strenuous use of her arms or shoulders for at least another 6 weeks.  I have instructed her to be mindful of whether or not symptoms of chest discomfort are related to physical exertion, and if she is concerned that she might be having recurrent angina she could consider trying sublingual nitroglycerin but she should absolutely contact her cardiologist for prompt follow-up evaluation.  I think she is okay to start the cardiac rehab program.  We have not recommended any changes to her current medications.  The patient will return to our office for routine follow-up next fall, approximately 1 year following her surgery.  She will call and return sooner should specific problems or questions arise.    Salvatore Decentlarence H. Cornelius Moraswen, MD 09/02/2018 3:09 PM

## 2018-09-02 NOTE — Progress Notes (Signed)
Jaime Chavez 78 y.o. female DOB 26-Jul-1941 MRN [id number]       Nutrition  No diagnosis found. Past Medical History:  Diagnosis Date  . Acid reflux   . Coronary artery disease involving native coronary artery of native heart with angina pectoris (HCC) 06/13/2018  . Dyspnea   . Environmental allergies   . Hyperlipidemia 2014  . Left patella fracture 2005  . S/P CABG x 4 06/14/2018   LIMA to LAD, SVG to OM, Sequential SVG to RCA and PDA, EVH from bilateral thighs and RLL   Meds reviewed.    Current Outpatient Medications (Cardiovascular):  .  metoprolol tartrate (LOPRESSOR) 25 MG tablet, Take 1 tablet (25 mg total) by mouth 2 (two) times daily. .  rosuvastatin (CRESTOR) 20 MG tablet, Take 1 tablet (20 mg total) by mouth daily.   Current Outpatient Medications (Analgesics):  .  acetaminophen (TYLENOL) 325 MG tablet, Take 650 mg by mouth every 6 (six) hours as needed. Marland Kitchen  aspirin EC 81 MG tablet, Take 1 tablet (81 mg total) by mouth daily. .  traMADol (ULTRAM) 50 MG tablet, Take by mouth every 6 (six) hours as needed.  Current Outpatient Medications (Hematological):  .  clopidogrel (PLAVIX) 75 MG tablet, Take 1 tablet (75 mg total) by mouth daily.    HT: Ht Readings from Last 1 Encounters:  09/02/18 5\' 3"  (1.6 m)    WT: Wt Readings from Last 5 Encounters:  09/02/18 158 lb (71.7 kg)  07/22/18 159 lb (72.1 kg)  07/09/18 159 lb 9.6 oz (72.4 kg)  06/18/18 166 lb 1.6 oz (75.3 kg)  06/12/18 163 lb 9.6 oz (74.2 kg)     BMI = 27.99  Current tobacco use? No       Labs:  Lipid Panel     Component Value Date/Time   CHOL 314 (H) 02/28/2018 0840   TRIG 262 (H) 02/28/2018 0840   HDL 54 02/28/2018 0840   CHOLHDL 5.8 (H) 02/28/2018 0840   LDLCALC 208 (H) 02/28/2018 0840    Lab Results  Component Value Date   HGBA1C 6.1 (H) 06/13/2018   CBG (last 3)  No results for input(s): GLUCAP in the last 72 hours.  Nutrition Diagnosis ? Food-and nutrition-related knowledge deficit  related to lack of exposure to information as related to diagnosis of: ? CVD ? pre-diabetes  Nutrition Goal(s):  ? To be determined  Plan:  Pt to attend nutrition classes ? Nutrition I ? Nutrition II ? Portion Distortion  Will provide client-centered nutrition education as part of interdisciplinary care.   Monitor and evaluate progress toward nutrition goal with team.  Ross Marcus, MS, RD, LDN 09/02/2018 3:08 PM

## 2018-09-05 ENCOUNTER — Encounter (HOSPITAL_COMMUNITY)
Admission: RE | Admit: 2018-09-05 | Discharge: 2018-09-05 | Disposition: A | Discharge status: Home / Self Care | Payer: Medicare HMO | Source: Ambulatory Visit | Attending: Internal Medicine | Admitting: Internal Medicine

## 2018-09-05 ENCOUNTER — Encounter (HOSPITAL_COMMUNITY): Payer: Self-pay

## 2018-09-05 VITALS — BP 118/70 | HR 66 | Ht 62.0 in | Wt 155.9 lb

## 2018-09-05 DIAGNOSIS — Z951 Presence of aortocoronary bypass graft: Secondary | ICD-10-CM | POA: Insufficient documentation

## 2018-09-05 NOTE — Progress Notes (Signed)
Jaime Chavez 78 y.o. female DOB: [birth year] MRN: [id number]      Nutrition Note  1. S/P CABG x 4    Past Medical History:  Diagnosis Date  . Acid reflux   . Coronary artery disease involving native coronary artery of native heart with angina pectoris (HCC) 06/13/2018  . Dyspnea   . Environmental allergies   . Hyperlipidemia 2014  . Left patella fracture 2005  . S/P CABG x 4 06/14/2018   LIMA to LAD, SVG to OM, Sequential SVG to RCA and PDA, EVH from bilateral thighs and RLL   Meds reviewed.    Current Outpatient Medications (Cardiovascular):  .  metoprolol tartrate (LOPRESSOR) 25 MG tablet, Take 1 tablet (25 mg total) by mouth 2 (two) times daily. .  rosuvastatin (CRESTOR) 20 MG tablet, Take 1 tablet (20 mg total) by mouth daily.  Current Outpatient Medications (Respiratory):  .  fluticasone (FLONASE) 50 MCG/ACT nasal spray, Place into both nostrils daily.  Current Outpatient Medications (Analgesics):  .  acetaminophen (TYLENOL) 325 MG tablet, Take 650 mg by mouth every 6 (six) hours as needed. Marland Kitchen.  aspirin EC 81 MG tablet, Take 1 tablet (81 mg total) by mouth daily. Marland Kitchen.  ibuprofen (ADVIL,MOTRIN) 200 MG tablet, Take 200 mg by mouth every 6 (six) hours as needed. .  traMADol (ULTRAM) 50 MG tablet, Take by mouth every 6 (six) hours as needed.  Current Outpatient Medications (Hematological):  .  clopidogrel (PLAVIX) 75 MG tablet, Take 1 tablet (75 mg total) by mouth daily.  Current Outpatient Medications (Other):  .  calcium gluconate 500 MG tablet, Take 1 tablet by mouth 3 (three) times daily. Marland Kitchen.  glucosamine-chondroitin 500-400 MG tablet, Take 1 tablet by mouth 3 (three) times daily.   HT: Ht Readings from Last 1 Encounters:  09/05/18 5\' 2"  (1.575 m)    WT: Wt Readings from Last 5 Encounters:  09/05/18 155 lb 13.8 oz (70.7 kg)  09/02/18 158 lb (71.7 kg)  07/22/18 159 lb (72.1 kg)  07/09/18 159 lb 9.6 oz (72.4 kg)  06/18/18 166 lb 1.6 oz (75.3 kg)     Body mass index is  28.51 kg/m.   Current tobacco use? No  Labs:  Lipid Panel     Component Value Date/Time   CHOL 314 (H) 02/28/2018 0840   TRIG 262 (H) 02/28/2018 0840   HDL 54 02/28/2018 0840   CHOLHDL 5.8 (H) 02/28/2018 0840   LDLCALC 208 (H) 02/28/2018 0840    Lab Results  Component Value Date   HGBA1C 6.1 (H) 06/13/2018   CBG (last 3)  No results for input(s): GLUCAP in the last 72 hours.  Nutrition Note Spoke with pt. Nutrition plan and goals reviewed with pt. Pt is following Step 1 of the Therapeutic Lifestyle Changes diet. Pt wants to lose wt. Pt has not  been trying to lose wt, has been focusing on resting after her cardiac event. Heart healthy, pre diabetic wt loss tips reviewed (label reading, how to build a healthy plate, portion sizes, eating frequently across the day). Pt has Pre-diabetes. Pt last A1C was elevated, 6.1. Discussed the differences between complex and refined carbs, recommended pt replace refined carbs with complex. Reviewed the benefits swapping in complex carbs and moderating portion sizes can have on managing blood glucose with patient. Per discussion, pt does not use canned/convenience foods often. Pt does not add salt to food. Pt does not eat out frequently. Pt expressed understanding of the information reviewed. Pt aware of nutrition  education classes offered and would like to attend nutrition classes.  Nutrition Diagnosis ? Food-and nutrition-related knowledge deficit related to lack of exposure to information as related to diagnosis of: ? CVD ? Pre-diabetes ? Overweight  related to excessive energy intake as evidenced by a Body mass index is 28.51 kg/m.  Nutrition Intervention ? Pt's individual nutrition plan and goals reviewed with pt. ? Pt given handouts for: ? Nutrition I class ? Nutrition II class  ? Diabetes Blitz Class ? Diabetes Q & A class  ? Consistent vit K diet ? low sodium ? DM ? pre-diabetes  Nutrition Goal(s):  ? Pt to identify and limit food  sources of saturated fat, trans fat, refined carbohydrates and sodium ? Pt to identify food quantities necessary to achieve weight loss of 6-24 lbs. at graduation from cardiac rehab.  ? Pt able to name foods that affect blood glucose.  Plan:  ? Pt to attend nutrition classes ? Nutrition I ? Nutrition II ? Portion Distortion  ? Will provide client-centered nutrition education as part of interdisciplinary care ? Monitor and evaluate progress toward nutrition goal with team.  Ross Marcus, MS, RD, LDN 09/05/2018 3:53 PM

## 2018-09-05 NOTE — Progress Notes (Signed)
Cardiac Individual Treatment Plan  Patient Details  Name: Jaime Chavez MRN: [id number] Date of Birth: Mar 13, 1941 Referring Provider:     CARDIAC REHAB PHASE II ORIENTATION from 09/05/2018 in [facility] CARDIAC REHAB  Referring Provider  Parke Poisson MD       Initial Encounter Date:    CARDIAC REHAB PHASE II ORIENTATION from 09/05/2018 in [facility] CARDIAC REHAB  Date  09/05/18      Visit Diagnosis: S/P CABG x 4  Patient's Home Medications on Admission:  Current Outpatient Medications:  .  acetaminophen (TYLENOL) 325 MG tablet, Take 650 mg by mouth every 6 (six) hours as needed., Disp: , Rfl:  .  aspirin EC 81 MG tablet, Take 1 tablet (81 mg total) by mouth daily., Disp: 60 tablet, Rfl: 2 .  calcium gluconate 500 MG tablet, Take 1 tablet by mouth 3 (three) times daily., Disp: , Rfl:  .  clopidogrel (PLAVIX) 75 MG tablet, Take 1 tablet (75 mg total) by mouth daily., Disp: 30 tablet, Rfl: 3 .  fluticasone (FLONASE) 50 MCG/ACT nasal spray, Place into both nostrils daily., Disp: , Rfl:  .  glucosamine-chondroitin 500-400 MG tablet, Take 1 tablet by mouth 3 (three) times daily., Disp: , Rfl:  .  ibuprofen (ADVIL,MOTRIN) 200 MG tablet, Take 200 mg by mouth every 6 (six) hours as needed., Disp: , Rfl:  .  metoprolol tartrate (LOPRESSOR) 25 MG tablet, Take 1 tablet (25 mg total) by mouth 2 (two) times daily., Disp: 60 tablet, Rfl: 3 .  rosuvastatin (CRESTOR) 20 MG tablet, Take 1 tablet (20 mg total) by mouth daily., Disp: 90 tablet, Rfl: 3 .  traMADol (ULTRAM) 50 MG tablet, Take by mouth every 6 (six) hours as needed., Disp: , Rfl:   Past Medical History: Past Medical History:  Diagnosis Date  . Acid reflux   . Coronary artery disease involving native coronary artery of native heart with angina pectoris (HCC) 06/13/2018  . Dyspnea   . Environmental allergies   . Hyperlipidemia 2014  . Left patella fracture 2005  . S/P CABG x 4 06/14/2018    LIMA to LAD, SVG to OM, Sequential SVG to RCA and PDA, EVH from bilateral thighs and RLL    Tobacco Use: Social History   Tobacco Use  Smoking Status Never Smoker  Smokeless Tobacco Never Used    Labs: Recent Review Flowsheet Data    Labs for ITP Cardiac and Pulmonary Rehab Latest Ref Rng & Units 06/14/2018 06/14/2018 06/14/2018 06/14/2018 06/15/2018   Cholestrol 100 - 199 mg/dL - - - - -   LDLCALC 0 - 99 mg/dL - - - - -   HDL >62 mg/dL - - - - -   Trlycerides 0 - 149 mg/dL - - - - -   Hemoglobin A1c 4.8 - 5.6 % - - - - -   PHART 7.350 - 7.450 7.444 7.331(L) - 7.333(L) 7.368   PCO2ART 32.0 - 48.0 mmHg 29.7(L) 37.9 - 35.9 35.8   HCO3 20.0 - 28.0 mmol/L 20.6 20.0 - 19.0(L) 20.1   TCO2 22 - 32 mmol/L 22 21(L) 20(L) 20(L) 24   ACIDBASEDEF 0.0 - 2.0 mmol/L 3.0(H) 5.0(H) - 6.0(H) 4.3(H)   O2SAT % 100.0 99.0 - 99.0 97.9      Capillary Blood Glucose: Lab Results  Component Value Date   GLUCAP 149 (H) 06/17/2018   GLUCAP 137 (H) 06/17/2018   GLUCAP 134 (H) 06/16/2018   GLUCAP 138 (H) 06/16/2018  GLUCAP 139 (H) 06/16/2018     Exercise Target Goals: Exercise Program Goal: Individual exercise prescription set using results from initial 6 min walk test and THRR while considering  patient's activity barriers and safety.   Exercise Prescription Goal: Initial exercise prescription builds to 30-45 minutes a day of aerobic activity, 2-3 days per week.  Home exercise guidelines will be given to patient during program as part of exercise prescription that the participant will acknowledge.  Activity Barriers & Risk Stratification: Activity Barriers & Cardiac Risk Stratification - 09/05/18 1503      Activity Barriers & Cardiac Risk Stratification   Activity Barriers  Deconditioning;Incisional Pain;Muscular Weakness    Cardiac Risk Stratification  High       6 Minute Walk: 6 Minute Walk    Row Name 09/05/18 1519         6 Minute Walk   Phase  Initial     Distance  1210 feet      Walk Time  6 minutes     # of Rest Breaks  1     MPH  2.29     METS  2.24     RPE  11     Perceived Dyspnea   0     VO2 Peak  7.84     Symptoms  Yes (comment)     Comments  Pt stopped for rest break at 5 minutes and 51 seconds. Pt rested for 8 seconds. Started at 5.59.      Resting HR  66 bpm     Resting BP  118/70     Resting Oxygen Saturation   98 %     Exercise Oxygen Saturation  during 6 min walk  98 %     Max Ex. HR  98 bpm     Max Ex. BP  122/72     2 Minute Post BP  122/76        Oxygen Initial Assessment:   Oxygen Re-Evaluation:   Oxygen Discharge (Final Oxygen Re-Evaluation):   Initial Exercise Prescription: Initial Exercise Prescription - 09/05/18 1500      Date of Initial Exercise RX and Referring Provider   Date  09/05/18    Referring Provider  Weston Brass A MD     Expected Discharge Date  12/13/18      Recumbant Bike   Level  1    Watts  10    Minutes  10    METs  1.8      NuStep   Level  2    SPM  75    Minutes  10    METs  2      Track   Laps  8    Minutes  10    METs  2.38      Prescription Details   Frequency (times per week)  3x    Duration  Progress to 30 minutes of continuous aerobic without signs/symptoms of physical distress      Intensity   THRR 40-80% of Max Heartrate  58-116    Ratings of Perceived Exertion  11-13    Perceived Dyspnea  0-4      Progression   Progression  Continue progressive overload as per policy without signs/symptoms or physical distress.      Resistance Training   Training Prescription  Yes    Weight  1lbs    Reps  10-15       Perform Capillary Blood Glucose checks as needed.  Exercise Prescription Changes:   Exercise Comments:   Exercise Goals and Review: Exercise Goals    Row Name 09/05/18 1407             Exercise Goals   Increase Physical Activity  Yes       Intervention  Provide advice, education, support and counseling about physical activity/exercise needs.;Develop an  individualized exercise prescription for aerobic and resistive training based on initial evaluation findings, risk stratification, comorbidities and participant's personal goals.       Expected Outcomes  Short Term: Attend rehab on a regular basis to increase amount of physical activity.;Long Term: Exercising regularly at least 3-5 days a week.;Long Term: Add in home exercise to make exercise part of routine and to increase amount of physical activity.       Increase Strength and Stamina  Yes       Intervention  Provide advice, education, support and counseling about physical activity/exercise needs.;Develop an individualized exercise prescription for aerobic and resistive training based on initial evaluation findings, risk stratification, comorbidities and participant's personal goals.       Expected Outcomes  Short Term: Perform resistance training exercises routinely during rehab and add in resistance training at home;Long Term: Improve cardiorespiratory fitness, muscular endurance and strength as measured by increased METs and functional capacity (6MWT);Short Term: Increase workloads from initial exercise prescription for resistance, speed, and METs.       Able to understand and use rate of perceived exertion (RPE) scale  Yes       Intervention  Provide education and explanation on how to use RPE scale       Expected Outcomes  Short Term: Able to use RPE daily in rehab to express subjective intensity level;Long Term:  Able to use RPE to guide intensity level when exercising independently       Knowledge and understanding of Target Heart Rate Range (THRR)  Yes       Intervention  Provide education and explanation of THRR including how the numbers were predicted and where they are located for reference       Expected Outcomes  Short Term: Able to state/look up THRR;Long Term: Able to use THRR to govern intensity when exercising independently;Short Term: Able to use daily as guideline for intensity in  rehab       Able to check pulse independently  Yes       Intervention  Provide education and demonstration on how to check pulse in carotid and radial arteries.;Review the importance of being able to check your own pulse for safety during independent exercise       Expected Outcomes  Short Term: Able to explain why pulse checking is important during independent exercise;Long Term: Able to check pulse independently and accurately       Understanding of Exercise Prescription  Yes       Intervention  Provide education, explanation, and written materials on patient's individual exercise prescription       Expected Outcomes  Short Term: Able to explain program exercise prescription;Long Term: Able to explain home exercise prescription to exercise independently          Exercise Goals Re-Evaluation :   Discharge Exercise Prescription (Final Exercise Prescription Changes):   Nutrition:  Target Goals: Understanding of nutrition guidelines, daily intake of sodium 1500mg , cholesterol 200mg , calories 30% from fat and 7% or less from saturated fats, daily to have 5 or more servings of fruits and vegetables.  Biometrics: Pre Biometrics - 09/05/18 1502  Pre Biometrics   Height  5\' 2"  (1.575 m)    Weight  70.7 kg    Waist Circumference  40 inches    Hip Circumference  43 inches    Waist to Hip Ratio  0.93 %    BMI (Calculated)  28.5    Triceps Skinfold  32 mm    % Body Fat  42.8 %    Grip Strength  23.5 kg    Flexibility  12 in    Single Leg Stand  11 seconds        Nutrition Therapy Plan and Nutrition Goals:   Nutrition Assessments:   Nutrition Goals Re-Evaluation:   Nutrition Goals Re-Evaluation:   Nutrition Goals Discharge (Final Nutrition Goals Re-Evaluation):   Psychosocial: Target Goals: Acknowledge presence or absence of significant depression and/or stress, maximize coping skills, provide positive support system. Participant is able to verbalize types and ability  to use techniques and skills needed for reducing stress and depression.  Initial Review & Psychosocial Screening: Initial Psych Review & Screening - 09/05/18 1539      Initial Review   Current issues with  None Identified      Family Dynamics   Good Support System?  Yes   Pt lists her spouse and son as sources of support.  Her husband was present at today's orientation session.      Barriers   Psychosocial barriers to participate in program  There are no identifiable barriers or psychosocial needs.      Screening Interventions   Interventions  Encouraged to exercise       Quality of Life Scores: Quality of Life - 09/05/18 1524      Quality of Life   Select  Quality of Life      Quality of Life Scores   Health/Function Pre  20.77 %    Socioeconomic Pre  26.36 %    Psych/Spiritual Pre  27.86 %    Family Pre  27.6 %    GLOBAL Pre  24.38 %      Scores of 19 and below usually indicate a poorer quality of life in these areas.  A difference of  2-3 points is a clinically meaningful difference.  A difference of 2-3 points in the total score of the Quality of Life Index has been associated with significant improvement in overall quality of life, self-image, physical symptoms, and general health in studies assessing change in quality of life.  PHQ-9: Recent Review Flowsheet Data    Depression screen [facility] 2/9 05/09/2018 02/25/2018   Decreased Interest 0 0   Down, Depressed, Hopeless 0 0   PHQ - 2 Score 0 0     Interpretation of Total Score  Total Score Depression Severity:  1-4 = Minimal depression, 5-9 = Mild depression, 10-14 = Moderate depression, 15-19 = Moderately severe depression, 20-27 = Severe depression   Psychosocial Evaluation and Intervention:   Psychosocial Re-Evaluation:   Psychosocial Discharge (Final Psychosocial Re-Evaluation):   Vocational Rehabilitation: Provide vocational rehab assistance to qualifying candidates.   Vocational Rehab Evaluation &  Intervention: Vocational Rehab - 09/05/18 1540      Initial Vocational Rehab Evaluation & Intervention   Assessment shows need for Vocational Rehabilitation  No       Education: Education Goals: Education classes will be provided on a weekly basis, covering required topics. Participant will state understanding/return demonstration of topics presented.  Learning Barriers/Preferences: Learning Barriers/Preferences - 09/05/18 1504      Learning Barriers/Preferences  Learning Barriers  None    Learning Preferences  Verbal Instruction;Skilled Demonstration;Written Material       Education Topics: Count Your Pulse:  -Group instruction provided by verbal instruction, demonstration, patient participation and written materials to support subject.  Instructors address importance of being able to find your pulse and how to count your pulse when at home without a heart monitor.  Patients get hands on experience counting their pulse with staff help and individually.   Heart Attack, Angina, and Risk Factor Modification:  -Group instruction provided by verbal instruction, video, and written materials to support subject.  Instructors address signs and symptoms of angina and heart attacks.    Also discuss risk factors for heart disease and how to make changes to improve heart health risk factors.   Functional Fitness:  -Group instruction provided by verbal instruction, demonstration, patient participation, and written materials to support subject.  Instructors address safety measures for doing things around the house.  Discuss how to get up and down off the floor, how to pick things up properly, how to safely get out of a chair without assistance, and balance training.   Meditation and Mindfulness:  -Group instruction provided by verbal instruction, patient participation, and written materials to support subject.  Instructor addresses importance of mindfulness and meditation practice to help reduce  stress and improve awareness.  Instructor also leads participants through a meditation exercise.    Stretching for Flexibility and Mobility:  -Group instruction provided by verbal instruction, patient participation, and written materials to support subject.  Instructors lead participants through series of stretches that are designed to increase flexibility thus improving mobility.  These stretches are additional exercise for major muscle groups that are typically performed during regular warm up and cool down.   Hands Only CPR:  -Group verbal, video, and participation provides a basic overview of AHA guidelines for community CPR. Role-play of emergencies allow participants the opportunity to practice calling for help and chest compression technique with discussion of AED use.   Hypertension: -Group verbal and written instruction that provides a basic overview of hypertension including the most recent diagnostic guidelines, risk factor reduction with self-care instructions and medication management.    Nutrition I class: Heart Healthy Eating:  -Group instruction provided by PowerPoint slides, verbal discussion, and written materials to support subject matter. The instructor gives an explanation and review of the Therapeutic Lifestyle Changes diet recommendations, which includes a discussion on lipid goals, dietary fat, sodium, fiber, plant stanol/sterol esters, sugar, and the components of a well-balanced, healthy diet.   Nutrition II class: Lifestyle Skills:  -Group instruction provided by PowerPoint slides, verbal discussion, and written materials to support subject matter. The instructor gives an explanation and review of label reading, grocery shopping for heart health, heart healthy recipe modifications, and ways to make healthier choices when eating out.   Diabetes Question & Answer:  -Group instruction provided by PowerPoint slides, verbal discussion, and written materials to support  subject matter. The instructor gives an explanation and review of diabetes co-morbidities, pre- and post-prandial blood glucose goals, pre-exercise blood glucose goals, signs, symptoms, and treatment of hypoglycemia and hyperglycemia, and foot care basics.   Diabetes Blitz:  -Group instruction provided by PowerPoint slides, verbal discussion, and written materials to support subject matter. The instructor gives an explanation and review of the physiology behind type 1 and type 2 diabetes, diabetes medications and rational behind using different medications, pre- and post-prandial blood glucose recommendations and Hemoglobin A1c goals, diabetes diet, and  exercise including blood glucose guidelines for exercising safely.    Portion Distortion:  -Group instruction provided by PowerPoint slides, verbal discussion, written materials, and food models to support subject matter. The instructor gives an explanation of serving size versus portion size, changes in portions sizes over the last 20 years, and what consists of a serving from each food group.   Stress Management:  -Group instruction provided by verbal instruction, video, and written materials to support subject matter.  Instructors review role of stress in heart disease and how to cope with stress positively.     Exercising on Your Own:  -Group instruction provided by verbal instruction, power point, and written materials to support subject.  Instructors discuss benefits of exercise, components of exercise, frequency and intensity of exercise, and end points for exercise.  Also discuss use of nitroglycerin and activating EMS.  Review options of places to exercise outside of rehab.  Review guidelines for sex with heart disease.   Cardiac Drugs I:  -Group instruction provided by verbal instruction and written materials to support subject.  Instructor reviews cardiac drug classes: antiplatelets, anticoagulants, beta blockers, and statins.   Instructor discusses reasons, side effects, and lifestyle considerations for each drug class.   Cardiac Drugs II:  -Group instruction provided by verbal instruction and written materials to support subject.  Instructor reviews cardiac drug classes: angiotensin converting enzyme inhibitors (ACE-I), angiotensin II receptor blockers (ARBs), nitrates, and calcium channel blockers.  Instructor discusses reasons, side effects, and lifestyle considerations for each drug class.   Anatomy and Physiology of the Circulatory System:  Group verbal and written instruction and models provide basic cardiac anatomy and physiology, with the coronary electrical and arterial systems. Review of: AMI, Angina, Valve disease, Heart Failure, Peripheral Artery Disease, Cardiac Arrhythmia, Pacemakers, and the ICD.   Other Education:  -Group or individual verbal, written, or video instructions that support the educational goals of the cardiac rehab program.   Holiday Eating Survival Tips:  -Group instruction provided by PowerPoint slides, verbal discussion, and written materials to support subject matter. The instructor gives patients tips, tricks, and techniques to help them not only survive but enjoy the holidays despite the onslaught of food that accompanies the holidays.   Knowledge Questionnaire Score: Knowledge Questionnaire Score - 09/05/18 1407      Knowledge Questionnaire Score   Pre Score  20/24       Core Components/Risk Factors/Patient Goals at Admission: Personal Goals and Risk Factors at Admission - 09/05/18 1503      Core Components/Risk Factors/Patient Goals on Admission    Weight Management  Yes;Weight Loss    Intervention  Weight Management: Develop a combined nutrition and exercise program designed to reach desired caloric intake, while maintaining appropriate intake of nutrient and fiber, sodium and fats, and appropriate energy expenditure required for the weight goal.;Weight Management:  Provide education and appropriate resources to help participant work on and attain dietary goals.;Weight Management/Obesity: Establish reasonable short term and long term weight goals.    Admit Weight  155 lb 13.8 oz (70.7 kg)    Goal Weight: Long Term  145 lb (65.8 kg)    Expected Outcomes  Short Term: Continue to assess and modify interventions until short term weight is achieved;Long Term: Adherence to nutrition and physical activity/exercise program aimed toward attainment of established weight goal;Weight Loss: Understanding of general recommendations for a balanced deficit meal plan, which promotes 1-2 lb weight loss per week and includes a negative energy balance of [id number] kcal/d;Understanding recommendations  for meals to include 15-35% energy as protein, 25-35% energy from fat, 35-60% energy from carbohydrates, less than 200mg  of dietary cholesterol, 20-35 gm of total fiber daily;Understanding of distribution of calorie intake throughout the day with the consumption of 4-5 meals/snacks    Hypertension  Yes    Intervention  Provide education on lifestyle modifcations including regular physical activity/exercise, weight management, moderate sodium restriction and increased consumption of fresh fruit, vegetables, and low fat dairy, alcohol moderation, and smoking cessation.;Monitor prescription use compliance.    Expected Outcomes  Short Term: Continued assessment and intervention until BP is < 140/64mm HG in hypertensive participants. < 130/65mm HG in hypertensive participants with diabetes, heart failure or chronic kidney disease.;Long Term: Maintenance of blood pressure at goal levels.    Lipids  Yes    Intervention  Provide education and support for participant on nutrition & aerobic/resistive exercise along with prescribed medications to achieve LDL 70mg , HDL >40mg .    Expected Outcomes  Short Term: Participant states understanding of desired cholesterol values and is compliant with medications  prescribed. Participant is following exercise prescription and nutrition guidelines.;Long Term: Cholesterol controlled with medications as prescribed, with individualized exercise RX and with personalized nutrition plan. Value goals: LDL < 70mg , HDL > 40 mg.       Core Components/Risk Factors/Patient Goals Review:    Core Components/Risk Factors/Patient Goals at Discharge (Final Review):    ITP Comments: ITP Comments    Row Name 09/05/18 1406           ITP Comments  Dr. Armanda Magic, Medical Director          Comments: Patient attended orientation from 1333 to 1511 to review rules and guidelines for program. Completed 6 minute walk test, Intitial ITP, and exercise prescription.  VSS. Telemetry-SR.  Pt reported chest pressure at the end of walk test.  No CP.  This has been an ongoing issue for the patient.  Pt endorses having this chest pressure at varying times.  Previously mostly at night but has happened lately with activity (walking).  Sometimes she takes Advil for relief in more severe instances and other times she can rest for relief of pressure.  She had discussed this with her surgeon at a f/u appointment but not her cardiologist as advised.  Will forward to cardiologist for review.  Chest pressure relieved with rest.

## 2018-09-06 ENCOUNTER — Telehealth: Payer: Self-pay

## 2018-09-06 NOTE — Telephone Encounter (Signed)
-----   Message from Parke Poisson, MD sent at 09/06/2018  7:46 AM EST ----- Regarding: RE: Cardiac Rehab Lavella Lemons will call the patient today and discuss her chest pressure. I plan to start her on Imdur.   Misty Stanley,  Please send a script for Imdur 15 mg daily and have the patient record daily blood pressures esp if symptomatic. We will increase the dose in 1 week if she tolerates (let's plan to call her).  Thanks, GA ----- Message ----- From: Nikki Dom, RN Sent: 09/05/2018   3:27 PM EST To: Jarvis Newcomer, RN, # Subject: Cardiac Rehab                                  Dr. Jacques Navy,   Mrs.Cotta presented today for Cardiac Rehab Orientation.  Pt reported chest pressure at the end of the 6 minute walk test.  No chest pain.  Chest pressure relieved with rest.  VSS. 110's-120's/70's HR SR 60's at rest and max ST 98 with walk test.  Oxygen sat 98% throughout.    This has been an ongoing issue for the patient.  Pt endorses having this chest pressure at varying times.  Previously mostly at night but has happened lately with activity (walking).  The pressure radiates down her arms.  Sometimes she takes Advil for relief in more severe instances and other times she can rest for relief of pressure.  Pt previously equated this pressure with her prior angina.  This was discussed with Dr. Cornelius Moras but he recommended for her to let you know if this continues.     Can patient participate in CR exercise with this chest pressure?  Please advise.   Thank you! Nikki Dom

## 2018-09-06 NOTE — Telephone Encounter (Signed)
Spoke with pt. Pt aware of Dr.Acharya's recommendation. Pt is agreeable to start Isosorbide 15mg  daily. Rx sent to pt pharmacy. Pt sts that she attends Cardiac Rehab on Mon. Wed, Fri. Adv pt that Dr.Acharya would like her to sit out for one session and she can return to Cardiac rehab on Wed. Adv pt to monitor her BP daily. She is to call the office in 1 week to give an update of her symptoms. She is to contact the office sooner if symptoms worsen. Adv pt that Dr.Acharya plans to titrate up Isosorbide if she is tolerating it well.  Noted Ibuprofen is listed on the pt med list. Adv her to avoid NSAIDs, she is on DAPT at it can increase her risk of bleeding. She is to use Tylenol as needed for pain.  Pt verbalizes understanding to the instruction given and voiced appreciation for the call.

## 2018-09-09 ENCOUNTER — Ambulatory Visit (HOSPITAL_COMMUNITY): Payer: Medicare HMO

## 2018-09-09 ENCOUNTER — Encounter (HOSPITAL_COMMUNITY): Payer: Medicare HMO

## 2018-09-09 ENCOUNTER — Telehealth: Payer: Self-pay | Admitting: Internal Medicine

## 2018-09-09 MED ORDER — ISOSORBIDE MONONITRATE ER 30 MG PO TB24: 15.0000 mg | ORAL_TABLET | Freq: Every day | ORAL | 3 refills | Status: DC

## 2018-09-09 NOTE — Telephone Encounter (Signed)
  Patient is calling because she was told a prescription would be called in to [facility] last week. She went to pick up medicine but [facility] says they never received it. Patient also needs to know when she can go back to Cardiac Rehab.

## 2018-09-09 NOTE — Telephone Encounter (Signed)
Per 1/17 phone note:   Pt is agreeable to start Isosorbide 15mg  daily. Rx sent to pt pharmacy. Pt sts that she attends Cardiac Rehab on Mon. Wed, Fri. Adv pt that Dr.Acharya would like her to sit out for one session and she can return to Cardiac rehab on Wed. Adv pt to monitor her BP daily. She is to call the office in 1 week to give an update of her symptoms. She is to contact the office sooner if symptoms worsen. Adv pt that Dr.Acharya plans to titrate up Isosorbide if she is tolerating it well.  09/09/18 - Rx sent to [facility], patient called and notified of this. Reiterated instructions from 1/17 that patient should sit out 1 cardiac rehab session per MD

## 2018-09-11 ENCOUNTER — Ambulatory Visit (HOSPITAL_COMMUNITY): Payer: Medicare HMO

## 2018-09-11 ENCOUNTER — Encounter (HOSPITAL_COMMUNITY)
Admission: RE | Admit: 2018-09-11 | Discharge: 2018-09-11 | Disposition: A | Discharge status: Home / Self Care | Payer: Medicare HMO | Source: Ambulatory Visit | Attending: Internal Medicine | Admitting: Internal Medicine

## 2018-09-11 DIAGNOSIS — Z951 Presence of aortocoronary bypass graft: Secondary | ICD-10-CM

## 2018-09-11 NOTE — Progress Notes (Signed)
Pt started cardiac rehab today.  Pt tolerated light exercise without difficulty. VSS, telemetry-Sinus Rhythm, asymptomatic.  Medication list reconciled. Pt denies barriers to medicaiton compliance.  PSYCHOSOCIAL ASSESSMENT:  PHQ-0. Pt exhibits positive coping skills, hopeful outlook with supportive family. No psychosocial needs identified at this time, no psychosocial interventions necessary.    Pt enjoys traveling and going out to eat.   Pt oriented to exercise equipment and routine.    Understanding verbalized.Gladstone Lighter, RN,BSN 09/11/2018 12:35 PM

## 2018-09-13 ENCOUNTER — Encounter (HOSPITAL_COMMUNITY)
Admission: RE | Admit: 2018-09-13 | Discharge: 2018-09-13 | Disposition: A | Discharge status: Home / Self Care | Payer: Medicare HMO | Source: Ambulatory Visit | Attending: Internal Medicine | Admitting: Internal Medicine

## 2018-09-13 ENCOUNTER — Ambulatory Visit (HOSPITAL_COMMUNITY): Payer: Medicare HMO

## 2018-09-13 DIAGNOSIS — Z951 Presence of aortocoronary bypass graft: Secondary | ICD-10-CM

## 2018-09-16 ENCOUNTER — Encounter (HOSPITAL_COMMUNITY)
Admission: RE | Admit: 2018-09-16 | Discharge: 2018-09-16 | Disposition: A | Discharge status: Home / Self Care | Payer: Medicare HMO | Source: Ambulatory Visit | Attending: Internal Medicine | Admitting: Internal Medicine

## 2018-09-16 ENCOUNTER — Ambulatory Visit (HOSPITAL_COMMUNITY): Payer: Medicare HMO

## 2018-09-16 DIAGNOSIS — Z951 Presence of aortocoronary bypass graft: Secondary | ICD-10-CM

## 2018-09-17 DIAGNOSIS — H524 Presbyopia: Secondary | ICD-10-CM | POA: Diagnosis not present

## 2018-09-18 ENCOUNTER — Encounter (HOSPITAL_COMMUNITY)
Admission: RE | Admit: 2018-09-18 | Discharge: 2018-09-18 | Disposition: A | Discharge status: Home / Self Care | Payer: Medicare HMO | Source: Ambulatory Visit | Attending: Internal Medicine | Admitting: Internal Medicine

## 2018-09-18 ENCOUNTER — Ambulatory Visit (HOSPITAL_COMMUNITY): Payer: Medicare HMO

## 2018-09-18 DIAGNOSIS — Z951 Presence of aortocoronary bypass graft: Secondary | ICD-10-CM

## 2018-09-18 NOTE — Progress Notes (Signed)
Jaime Chavez 78 y.o. female Nutrition Note Spoke with pt. Nutrition Plan and Nutrition Survey goals reviewed with pt. Pt is following a Heart Healthy diet. Pt wants to lose wt. Pt has been trying to lose wt since orientation by focusing on building a healthy plate, making healthier food choices, and decreasing portion sizes. Wt loss tips from orientation reviewed (label reading, how to build a healthy plate, portion sizes, eating frequently across the day). Since orientation pt has been label reading to keep an eye on sodium, types of fat, and carbs she is consuming. Pt last A1C was elevated at 6.1 (06/13/18). Discussed the differences between complex and refined carbs, recommended pt replace refined carbs with complex. Reviewed the benefits swapping in complex carbs and moderating portion sizes can have on managing blood glucose with patient. Since orientation pt has been working to reduce portion sizes of carbs, and focusing on more complex carbs. Per discussion, pt does not use canned/convenience foods often. Pt does not add salt to food. Pt does not eat out frequently. Pt expressed understanding of the information reviewed. Pt aware of nutrition education classes offered and would like to attend nutrition classes.  Lab Results  Component Value Date   HGBA1C 6.1 (H) 06/13/2018    Wt Readings from Last 3 Encounters:  09/05/18 155 lb 13.8 oz (70.7 kg)  09/02/18 158 lb (71.7 kg)  07/22/18 159 lb (72.1 kg)    Nutrition Diagnosis  Food-and nutrition-related knowledge deficit related to lack of exposure to information as related to diagnosis of: ? CVD ? Pre-diabetes  Overweight  related to excessive energy intake as evidenced by a Body mass index is 28.51 kg/m.  Nutrition Intervention ? Pt's individual nutrition plan reviewed with pt. ? Benefits of adopting Heart Healthy diet discussed when Med[employer] reviewed.    Goal(s)  Pt to identify and limit food sources of saturated fat, trans fat,  refined carbohydrates and sodium  Pt to identify food quantities necessary to achieve weight loss of 6-24 lbs. at graduation from cardiac rehab.   Pt able to name foods that affect blood glucose.  Plan:   Pt to attend nutrition classes ? Nutrition I ? Nutrition II ? Portion Distortion   Will provide client-centered nutrition education as part of interdisciplinary care  Monitor and evaluate progress toward nutrition goal with team.  Ross Marcus, MS, RD, LDN 09/18/2018 12:00 PM

## 2018-09-20 ENCOUNTER — Encounter (HOSPITAL_COMMUNITY)
Admission: RE | Admit: 2018-09-20 | Discharge: 2018-09-20 | Disposition: A | Discharge status: Home / Self Care | Payer: Medicare HMO | Source: Ambulatory Visit | Attending: Internal Medicine | Admitting: Internal Medicine

## 2018-09-20 ENCOUNTER — Ambulatory Visit (HOSPITAL_COMMUNITY): Payer: Medicare HMO

## 2018-09-20 DIAGNOSIS — Z951 Presence of aortocoronary bypass graft: Secondary | ICD-10-CM | POA: Diagnosis not present

## 2018-09-23 ENCOUNTER — Ambulatory Visit (HOSPITAL_COMMUNITY): Payer: Medicare HMO

## 2018-09-23 ENCOUNTER — Encounter (HOSPITAL_COMMUNITY)
Admission: RE | Admit: 2018-09-23 | Discharge: 2018-09-23 | Disposition: A | Discharge status: Home / Self Care | Payer: Medicare HMO | Source: Ambulatory Visit | Attending: Internal Medicine | Admitting: Internal Medicine

## 2018-09-23 DIAGNOSIS — Z951 Presence of aortocoronary bypass graft: Secondary | ICD-10-CM | POA: Insufficient documentation

## 2018-09-23 NOTE — Progress Notes (Signed)
I have reviewed a Home Exercise Prescription with [name] . Jaime Chavez is not currently exercising at home.  The patient was advised to walk and use home bike 2-3 days a week for 30-45 minutes.  Jaime Chavez and I discussed how to progress their exercise prescription. The patient stated that they understand the exercise prescription.  We reviewed exercise guidelines, target heart rate during exercise, RPE Scale, weather conditions, NTG use, endpoints for exercise, warmup and cool down. Patient is encouraged to come to me with any questions. I will continue to follow up with the patient to assist them with progression and safety.    York Cerise MS, ACSM CEP 4:47 PM 09/23/2018

## 2018-09-25 ENCOUNTER — Ambulatory Visit (HOSPITAL_COMMUNITY): Payer: Medicare HMO

## 2018-09-25 ENCOUNTER — Encounter (HOSPITAL_COMMUNITY)
Admission: RE | Admit: 2018-09-25 | Discharge: 2018-09-25 | Disposition: A | Discharge status: Home / Self Care | Payer: Medicare HMO | Source: Ambulatory Visit | Attending: Internal Medicine | Admitting: Internal Medicine

## 2018-09-25 DIAGNOSIS — Z951 Presence of aortocoronary bypass graft: Secondary | ICD-10-CM

## 2018-09-26 NOTE — Progress Notes (Signed)
Cardiac Individual Treatment Plan  Patient Details  Name: Jaime Chavez MRN: [id number] Date of Birth: 21-Nov-1940 Referring Provider:     CARDIAC REHAB PHASE II ORIENTATION [facility] 09[facility]05/2018 [facility]  Referring Provider  Parke PoissonAcharya, Gayatri A MD       Initial Encounter Date:    CARDIAC REHAB PHASE II ORIENTATION from 09/05/2018 in [facility]  Date  09/05/18      Visit Diagnosis: S/P CABG x 4  Patient's Home Medications on Admission:  Current Outpatient Medications:  .  acetaminophen (TYLENOL) 325 MG tablet, Take 650 mg by mouth every 6 (six) hours as needed., Disp: , Rfl:  .  aspirin EC 81 MG tablet, Take 1 tablet (81 mg total) by mouth daily., Disp: 60 tablet, Rfl: 2 .  calcium gluconate 500 MG tablet, Take 1 tablet by mouth 3 (three) times daily., Disp: , Rfl:  .  clopidogrel (PLAVIX) 75 MG tablet, Take 1 tablet (75 mg total) by mouth daily., Disp: 30 tablet, Rfl: 3 .  fluticasone (FLONASE) 50 MCG/ACT nasal spray, Place into both nostrils daily., Disp: , Rfl:  .  glucosamine-chondroitin 500-400 MG tablet, Take 1 tablet by mouth 3 (three) times daily., Disp: , Rfl:  .  isosorbide mononitrate (IMDUR) 30 MG 24 hr tablet, Take 0.5 tablets (15 mg total) by mouth daily., Disp: 45 tablet, Rfl: 3 .  metoprolol tartrate (LOPRESSOR) 25 MG tablet, Take 1 tablet (25 mg total) by mouth 2 (two) times daily., Disp: 60 tablet, Rfl: 3 .  rosuvastatin (CRESTOR) 20 MG tablet, Take 1 tablet (20 mg total) by mouth daily., Disp: 90 tablet, Rfl: 3 .  traMADol (ULTRAM) 50 MG tablet, Take by mouth every 6 (six) hours as needed., Disp: , Rfl:   Past Medical History: Past Medical History:  Diagnosis Date  . Acid reflux   . Coronary artery disease involving native coronary artery of native heart with angina pectoris (HCC) 06/13/2018  . Dyspnea   . Environmental allergies   . Hyperlipidemia 2014  . Left patella fracture 2005  . S/P CABG x 4  06/14/2018   LIMA to LAD, SVG to OM, Sequential SVG to RCA and PDA, EVH from bilateral thighs and RLL    Tobacco Use: Social History   Tobacco Use  Smoking Status Never Smoker  Smokeless Tobacco Never Used    Labs: Recent Review Flowsheet Data    Labs for ITP Cardiac and Pulmonary Rehab Latest Ref Rng & Units 06/14/2018 06/14/2018 06/14/2018 06/14/2018 06/15/2018   Cholestrol 100 - 199 mg/dL - - - - -   LDLCALC 0 - 99 mg/dL - - - - -   HDL >40>39 mg/dL - - - - -   Trlycerides 0 - 149 mg/dL - - - - -   Hemoglobin A1c 4.8 - 5.6 % - - - - -   PHART 7.350 - 7.450 7.444 7.331(L) - 7.333(L) 7.368   PCO2ART 32.0 - 48.0 mmHg 29.7(L) 37.9 - 35.9 35.8   HCO3 20.0 - 28.0 mmol/L 20.6 20.0 - 19.0(L) 20.1   TCO2 22 - 32 mmol/L 22 21(L) 20(L) 20(L) 24   ACIDBASEDEF 0.0 - 2.0 mmol/L 3.0(H) 5.0(H) - 6.0(H) 4.3(H)   O2SAT % 100.0 99.0 - 99.0 97.9      Capillary Blood Glucose: Lab Results  Component Value Date   GLUCAP 149 (H) 06/17/2018   GLUCAP 137 (H) 06/17/2018   GLUCAP 134 (H) 06/16/2018   GLUCAP 138 (H) 06/16/2018  GLUCAP 139 (H) 06/16/2018     Exercise Target Goals: Exercise Program Goal: Individual exercise prescription set using results from initial 6 min walk test and THRR while considering  patient's activity barriers and safety.   Exercise Prescription Goal: Initial exercise prescription builds to 30-45 minutes a day of aerobic activity, 2-3 days per week.  Home exercise guidelines will be given to patient during program as part of exercise prescription that the participant will acknowledge.  Activity Barriers & Risk Stratification: Activity Barriers & Cardiac Risk Stratification - 09/05/18 1503      Activity Barriers & Cardiac Risk Stratification   Activity Barriers  Deconditioning;Incisional Pain;Muscular Weakness    Cardiac Risk Stratification  High       6 Minute Walk: 6 Minute Walk    Row Name 09/05/18 1519         6 Minute Walk   Phase  Initial      Distance  1210 feet     Walk Time  6 minutes     # of Rest Breaks  1     MPH  2.29     METS  2.24     RPE  11     Perceived Dyspnea   0     VO2 Peak  7.84     Symptoms  Yes (comment)     Comments  Pt stopped for rest break at 5 minutes and 51 seconds. Pt rested for 8 seconds. Started at 5.59.      Resting HR  66 bpm     Resting BP  118/70     Resting Oxygen Saturation   98 %     Exercise Oxygen Saturation  during 6 min walk  98 %     Max Ex. HR  98 bpm     Max Ex. BP  122/72     2 Minute Post BP  122/76        Oxygen Initial Assessment:   Oxygen Re-Evaluation:   Oxygen Discharge (Final Oxygen Re-Evaluation):   Initial Exercise Prescription: Initial Exercise Prescription - 09/05/18 1500      Date of Initial Exercise RX and Referring Provider   Date  09/05/18    Referring Provider  Weston BrassAcharya, Gayatri A MD     Expected Discharge Date  12/13/18      Recumbant Bike   Level  1    Watts  10    Minutes  10    METs  1.8      NuStep   Level  2    SPM  75    Minutes  10    METs  2      Track   Laps  8    Minutes  10    METs  2.38      Prescription Details   Frequency (times per week)  3x    Duration  Progress to 30 minutes of continuous aerobic without signs/symptoms of physical distress      Intensity   THRR 40-80% of Max Heartrate  58-116    Ratings of Perceived Exertion  11-13    Perceived Dyspnea  0-4      Progression   Progression  Continue progressive overload as per policy without signs/symptoms or physical distress.      Resistance Training   Training Prescription  Yes    Weight  1lbs    Reps  10-15       Perform Capillary Blood Glucose checks as needed.  Exercise Prescription Changes: Exercise Prescription Changes    Row Name 09/11/18 1200 09/20/18 1036           Response to Exercise   Blood Pressure (Admit)  108/82  114/76      Blood Pressure (Exercise)  132/80  132/64      Blood Pressure (Exit)  114/70  104/68      Heart Rate (Admit)   78 bpm  80 bpm      Heart Rate (Exercise)  101 bpm  96 bpm      Heart Rate (Exit)  78 bpm  79 bpm      Rating of Perceived Exertion (Exercise)  13  12      Perceived Dyspnea (Exercise)  0  0      Symptoms  None  None      Comments  Pt oriented to exercise equipment  None      Duration  Progress to 30 minutes of  aerobic without signs/symptoms of physical distress  Progress to 30 minutes of  aerobic without signs/symptoms of physical distress      Intensity  THRR unchanged  THRR unchanged        Progression   Progression  Continue to progress workloads to maintain intensity without signs/symptoms of physical distress.  Continue to progress workloads to maintain intensity without signs/symptoms of physical distress.      Average METs  2.31  2.4        Resistance Training   Training Prescription  No  Yes      Weight  -  1lbs      Reps  -  10-15      Time  -  10 Minutes        Recumbant Bike   Level  1  1      Watts  10  10      Minutes  10  10      METs  2.2  2        NuStep   Level  2  2      SPM  75  85      Minutes  10  10      METs  2.2  2.3        Track   Laps  9  11      Minutes  10  10      METs  2.45  2.91        Home Exercise Plan   Plans to continue exercise at  -  Home (comment) Walking & Recumbent Bike       Frequency  -  Add 2 additional days to program exercise sessions.      Initial Home Exercises Provided  -  09/20/18         Exercise Comments: Exercise Comments    Row Name 09/11/18 1240 09/23/18 1648         Exercise Comments  Pt's first day of exercise. Pt oriented to exercise equipment and responded well to workloads. Will continue to monitor and progress as tolerated.   Reviewed HEP with pt. Pt verbalized understanding. Will continue to monitor and progress pt as tolerated.          Exercise Goals and Review: Exercise Goals    Row Name 09/05/18 1407             Exercise Goals   Increase Physical Activity  Yes       Intervention  Provide advice, education, support and counseling about physical activity/exercise needs.;Develop an individualized exercise prescription for aerobic and resistive training based on initial evaluation findings, risk stratification, comorbidities and participant's personal goals.       Expected Outcomes  Short Term: Attend rehab on a regular basis to increase amount of physical activity.;Long Term: Exercising regularly at least 3-5 days a week.;Long Term: Add in home exercise to make exercise part of routine and to increase amount of physical activity.       Increase Strength and Stamina  Yes       Intervention  Provide advice, education, support and counseling about physical activity/exercise needs.;Develop an individualized exercise prescription for aerobic and resistive training based on initial evaluation findings, risk stratification, comorbidities and participant's personal goals.       Expected Outcomes  Short Term: Perform resistance training exercises routinely during rehab and add in resistance training at home;Long Term: Improve cardiorespiratory fitness, muscular endurance and strength as measured by increased METs and functional capacity ( );Short Term: Increase workloads from initial exercise prescription for resistance, speed, and METs.       Able to understand and use rate of perceived exertion (RPE) scale  Yes       Intervention  Provide education and explanation on how to use RPE scale       Expected Outcomes  Short Term: Able to use RPE daily in rehab to express subjective intensity level;Long Term:  Able to use RPE to guide intensity level when exercising independently       Knowledge and understanding of Target Heart Rate Range (THRR)  Yes       Intervention  Provide education and explanation of THRR including how the numbers were predicted and where they are located for reference       Expected Outcomes  Short Term: Able to state/look up THRR;Long Term: Able to use THRR to govern  intensity when exercising independently;Short Term: Able to use daily as guideline for intensity in rehab       Able to check pulse independently  Yes       Intervention  Provide education and demonstration on how to check pulse in carotid and radial arteries.;Review the importance of being able to check your own pulse for safety during independent exercise       Expected Outcomes  Short Term: Able to explain why pulse checking is important during independent exercise;Long Term: Able to check pulse independently and accurately       Understanding of Exercise Prescription  Yes       Intervention  Provide education, explanation, and written materials on patient's individual exercise prescription       Expected Outcomes  Short Term: Able to explain program exercise prescription;Long Term: Able to explain home exercise prescription to exercise independently          Exercise Goals Re-Evaluation : Exercise Goals Re-Evaluation    Row Name 09/23/18 1648             Exercise Goal Re-Evaluation   Exercise Goals Review  Increase Physical Activity;Able to understand and use rate of perceived exertion (RPE) scale;Knowledge and understanding of Target Heart Rate Range (THRR);Understanding of Exercise Prescription;Increase Strength and Stamina;Able to check pulse independently       Comments  Reviewed HEP with pt. Also reviewed THRR, RPE Scale, weather conditions, endpoints of exericse, NTG use/when to call 911, warmup and cool down.        Expected Outcomes  Pt will plan to  walk and use home exercise bike 2-3 days a week for 30-45 minutes. Pt will continue to increase cardiovascular strength. Will continue to monitor and progress pt.           Discharge Exercise Prescription (Final Exercise Prescription Changes): Exercise Prescription Changes - 09/20/18 1036      Response to Exercise   Blood Pressure (Admit)  114/76    Blood Pressure (Exercise)  132/64    Blood Pressure (Exit)  104/68    Heart  Rate (Admit)  80 bpm    Heart Rate (Exercise)  96 bpm    Heart Rate (Exit)  79 bpm    Rating of Perceived Exertion (Exercise)  12    Perceived Dyspnea (Exercise)  0    Symptoms  None    Comments  None    Duration  Progress to 30 minutes of  aerobic without signs/symptoms of physical distress    Intensity  THRR unchanged      Progression   Progression  Continue to progress workloads to maintain intensity without signs/symptoms of physical distress.    Average METs  2.4      Resistance Training   Training Prescription  Yes    Weight  1lbs    Reps  10-15    Time  10 Minutes      Recumbant Bike   Level  1    Watts  10    Minutes  10    METs  2      NuStep   Level  2    SPM  85    Minutes  10    METs  2.3      Track   Laps  11    Minutes  10    METs  2.91      Home Exercise Plan   Plans to continue exercise at  Home (comment)   Walking & Recumbent Bike    Frequency  Add 2 additional days to program exercise sessions.    Initial Home Exercises Provided  09/20/18       Nutrition:  Target Goals: Understanding of nutrition guidelines, daily intake of sodium 1500mg , cholesterol 200mg , calories 30% from fat and 7% or less from saturated fats, daily to have 5 or more servings of fruits and vegetables.  Biometrics: Pre Biometrics - 09/05/18 1502      Pre Biometrics   Height  5\' 2"  (1.575 m)    Weight  70.7 kg    Waist Circumference  40 inches    Hip Circumference  43 inches    Waist to Hip Ratio  0.93 %    BMI (Calculated)  28.5    Triceps Skinfold  32 mm    % Body Fat  42.8 %    Grip Strength  23.5 kg    Flexibility  12 in    Single Leg Stand  11 seconds        Nutrition Therapy Plan and Nutrition Goals: Nutrition Therapy & Goals - 09/05/18 1605      Nutrition Therapy   Diet  heart healthy, carb modified      Personal Nutrition Goals   Nutrition Goal  Pt to identify and limit food sources of saturated fat, trans fat, refined carbohydrates and sodium     Personal Goal #2  Pt to identify food quantities necessary to achieve weight loss of 6-24 lbs. at graduation from cardiac rehab.     Personal Goal #3  Pt able to name  foods that affect blood glucose.      Intervention Plan   Intervention  Prescribe, educate and counsel regarding individualized specific dietary modifications aiming towards targeted core components such as weight, hypertension, lipid management, diabetes, heart failure and other comorbidities.    Expected Outcomes  Short Term Goal: Understand basic principles of dietary content, such as calories, fat, sodium, cholesterol and nutrients.;Long Term Goal: Adherence to prescribed nutrition plan.       Nutrition Assessments: Nutrition Assessments - 09/05/18 1604      MEDFICTS Scores   Pre Score  46       Nutrition Goals Re-Evaluation: Nutrition Goals Re-Evaluation    Row Name 09/05/18 1600             Goals   Current Weight  155 lb 13.8 oz (70.7 kg)          Nutrition Goals Re-Evaluation: Nutrition Goals Re-Evaluation    Row Name 09/05/18 1600             Goals   Current Weight  155 lb 13.8 oz (70.7 kg)          Nutrition Goals Discharge (Final Nutrition Goals Re-Evaluation): Nutrition Goals Re-Evaluation - 09/05/18 1600      Goals   Current Weight  155 lb 13.8 oz (70.7 kg)       Psychosocial: Target Goals: Acknowledge presence or absence of significant depression and/or stress, maximize coping skills, provide positive support system. Participant is able to verbalize types and ability to use techniques and skills needed for reducing stress and depression.  Initial Review & Psychosocial Screening: Initial Psych Review & Screening - 09/05/18 1539      Initial Review   Current issues with  None Identified      Family Dynamics   Good Support System?  Yes   Pt lists her spouse and son as sources of support.  Her husband was present at today's orientation session.      Barriers   Psychosocial  barriers to participate in program  There are no identifiable barriers or psychosocial needs.      Screening Interventions   Interventions  Encouraged to exercise       Quality of Life Scores: Quality of Life - 09/05/18 1524      Quality of Life   Select  Quality of Life      Quality of Life Scores   Health/Function Pre  20.77 %    Socioeconomic Pre  26.36 %    Psych/Spiritual Pre  27.86 %    Family Pre  27.6 %    GLOBAL Pre  24.38 %      Scores of 19 and below usually indicate a poorer quality of life in these areas.  A difference of  2-3 points is a clinically meaningful difference.  A difference of 2-3 points in the total score of the Quality of Life Index has been associated with significant improvement in overall quality of life, self-image, physical symptoms, and general health in studies assessing change in quality of life.  PHQ-9: Recent Review Flowsheet Data    Depression screen [facility] 2/9 05/09/2018 02/25/2018   Decreased Interest 0 0   Down, Depressed, Hopeless 0 0   PHQ - 2 Score 0 0     Interpretation of Total Score  Total Score Depression Severity:  1-4 = Minimal depression, 5-9 = Mild depression, 10-14 = Moderate depression, 15-19 = Moderately severe depression, 20-27 = Severe depression   Psychosocial Evaluation and  Intervention: Psychosocial Evaluation - 09/26/18 1215      Psychosocial Evaluation & Interventions   Interventions  Encouraged to exercise with the program and follow exercise prescription    Comments  No psychosocial interventions.    Expected Outcomes  [name] will maintain a positive outlook with good coping skills.    Continue Psychosocial Services   No Follow up required       Psychosocial Re-Evaluation: Psychosocial Re-Evaluation    Row Name 09/26/18 1215             Psychosocial Re-Evaluation   Current issues with  None Identified       Comments  No psychosocial interventions necessary.       Expected Outcomes  [name] will  maintain a positive outlook with good coping skills.       Interventions  Encouraged to attend Cardiac Rehabilitation for the exercise       Continue Psychosocial Services   No Follow up required          Psychosocial Discharge (Final Psychosocial Re-Evaluation): Psychosocial Re-Evaluation - 09/26/18 1215      Psychosocial Re-Evaluation   Current issues with  None Identified    Comments  No psychosocial interventions necessary.    Expected Outcomes  [name] will maintain a positive outlook with good coping skills.    Interventions  Encouraged to attend Cardiac Rehabilitation for the exercise    Continue Psychosocial Services   No Follow up required       Vocational Rehabilitation: Provide vocational rehab assistance to qualifying candidates.   Vocational Rehab Evaluation & Intervention: Vocational Rehab - 09/05/18 1540      Initial Vocational Rehab Evaluation & Intervention   Assessment shows need for Vocational Rehabilitation  No       Education: Education Goals: Education classes will be provided on a weekly basis, covering required topics. Participant will state understanding/return demonstration of topics presented.  Learning Barriers/Preferences: Learning Barriers/Preferences - 09/05/18 1504      Learning Barriers/Preferences   Learning Barriers  None    Learning Preferences  Verbal Instruction;Skilled Demonstration;Written Material       Education Topics: Count Your Pulse:  -Group instruction provided by verbal instruction, demonstration, patient participation and written materials to support subject.  Instructors address importance of being able to find your pulse and how to count your pulse when at home without a heart monitor.  Patients get hands on experience counting their pulse with staff help and individually.   CARDIAC REHAB PHASE II EXERCISE from 09/13/2018 in [facility] CARDIAC REHAB  Date  09/13/18  Educator  RN  Instruction Review Code   2- Demonstrated Understanding      Heart Attack, Angina, and Risk Factor Modification:  -Group instruction provided by verbal instruction, video, and written materials to support subject.  Instructors address signs and symptoms of angina and heart attacks.    Also discuss risk factors for heart disease and how to make changes to improve heart health risk factors.   Functional Fitness:  -Group instruction provided by verbal instruction, demonstration, patient participation, and written materials to support subject.  Instructors address safety measures for doing things around the house.  Discuss how to get up and down off the floor, how to pick things up properly, how to safely get out of a chair without assistance, and balance training.   Meditation and Mindfulness:  -Group instruction provided by verbal instruction, patient participation, and written materials to support subject.  Instructor addresses importance  of mindfulness and meditation practice to help reduce stress and improve awareness.  Instructor also leads participants through a meditation exercise.    Stretching for Flexibility and Mobility:  -Group instruction provided by verbal instruction, patient participation, and written materials to support subject.  Instructors lead participants through series of stretches that are designed to increase flexibility thus improving mobility.  These stretches are additional exercise for major muscle groups that are typically performed during regular warm up and cool down.   Hands Only CPR:  -Group verbal, video, and participation provides a basic overview of AHA guidelines for community CPR. Role-play of emergencies allow participants the opportunity to practice calling for help and chest compression technique with discussion of AED use.   Hypertension: -Group verbal and written instruction that provides a basic overview of hypertension including the most recent diagnostic guidelines, risk  factor reduction with self-care instructions and medication management.    Nutrition I class: Heart Healthy Eating:  -Group instruction provided by PowerPoint slides, verbal discussion, and written materials to support subject matter. The instructor gives an explanation and review of the Therapeutic Lifestyle Changes diet recommendations, which includes a discussion on lipid goals, dietary fat, sodium, fiber, plant stanol/sterol esters, sugar, and the components of a well-balanced, healthy diet.   Nutrition II class: Lifestyle Skills:  -Group instruction provided by PowerPoint slides, verbal discussion, and written materials to support subject matter. The instructor gives an explanation and review of label reading, grocery shopping for heart health, heart healthy recipe modifications, and ways to make healthier choices when eating out.   Diabetes Question & Answer:  -Group instruction provided by PowerPoint slides, verbal discussion, and written materials to support subject matter. The instructor gives an explanation and review of diabetes co-morbidities, pre- and post-prandial blood glucose goals, pre-exercise blood glucose goals, signs, symptoms, and treatment of hypoglycemia and hyperglycemia, and foot care basics.   Diabetes Blitz:  -Group instruction provided by PowerPoint slides, verbal discussion, and written materials to support subject matter. The instructor gives an explanation and review of the physiology behind type 1 and type 2 diabetes, diabetes medications and rational behind using different medications, pre- and post-prandial blood glucose recommendations and Hemoglobin A1c goals, diabetes diet, and exercise including blood glucose guidelines for exercising safely.    Portion Distortion:  -Group instruction provided by PowerPoint slides, verbal discussion, written materials, and food models to support subject matter. The instructor gives an explanation of serving size versus  portion size, changes in portions sizes over the last 20 years, and what consists of a serving from each food group.   Stress Management:  -Group instruction provided by verbal instruction, video, and written materials to support subject matter.  Instructors review role of stress in heart disease and how to cope with stress positively.     CARDIAC REHAB PHASE II EXERCISE from 09/18/2018 in [facility] CARDIAC REHAB  Date  09/18/18  Educator  RN  Instruction Review Code  2- Demonstrated Understanding      Exercising on Your Own:  -Group instruction provided by verbal instruction, power point, and written materials to support subject.  Instructors discuss benefits of exercise, components of exercise, frequency and intensity of exercise, and end points for exercise.  Also discuss use of nitroglycerin and activating EMS.  Review options of places to exercise outside of rehab.  Review guidelines for sex with heart disease.   Cardiac Drugs I:  -Group instruction provided by verbal instruction and written materials to support subject.  Instructor reviews cardiac drug classes: antiplatelets, anticoagulants, beta blockers, and statins.  Instructor discusses reasons, side effects, and lifestyle considerations for each drug class.   Cardiac Drugs II:  -Group instruction provided by verbal instruction and written materials to support subject.  Instructor reviews cardiac drug classes: angiotensin converting enzyme inhibitors (ACE-I), angiotensin II receptor blockers (ARBs), nitrates, and calcium channel blockers.  Instructor discusses reasons, side effects, and lifestyle considerations for each drug class.   Anatomy and Physiology of the Circulatory System:  Group verbal and written instruction and models provide basic cardiac anatomy and physiology, with the coronary electrical and arterial systems. Review of: AMI, Angina, Valve disease, Heart Failure, Peripheral Artery Disease, Cardiac  Arrhythmia, Pacemakers, and the ICD.   Other Education:  -Group or individual verbal, written, or video instructions that support the educational goals of the cardiac rehab program.   Holiday Eating Survival Tips:  -Group instruction provided by PowerPoint slides, verbal discussion, and written materials to support subject matter. The instructor gives patients tips, tricks, and techniques to help them not only survive but enjoy the holidays despite the onslaught of food that accompanies the holidays.   Knowledge Questionnaire Score: Knowledge Questionnaire Score - 09/05/18 1407      Knowledge Questionnaire Score   Pre Score  20/24       Core Components/Risk Factors/Patient Goals at Admission: Personal Goals and Risk Factors at Admission - 09/05/18 1503      Core Components/Risk Factors/Patient Goals on Admission    Weight Management  Yes;Weight Loss    Intervention  Weight Management: Develop a combined nutrition and exercise program designed to reach desired caloric intake, while maintaining appropriate intake of nutrient and fiber, sodium and fats, and appropriate energy expenditure required for the weight goal.;Weight Management: Provide education and appropriate resources to help participant work on and attain dietary goals.;Weight Management/Obesity: Establish reasonable short term and long term weight goals.    Admit Weight  155 lb 13.8 oz (70.7 kg)    Goal Weight: Long Term  145 lb (65.8 kg)    Expected Outcomes  Short Term: Continue to assess and modify interventions until short term weight is achieved;Long Term: Adherence to nutrition and physical activity/exercise program aimed toward attainment of established weight goal;Weight Loss: Understanding of general recommendations for a balanced deficit meal plan, which promotes 1-2 lb weight loss per week and includes a negative energy balance of [phone] kcal/d;Understanding recommendations for meals to include 15-35% energy as  protein, 25-35% energy from fat, 35-60% energy from carbohydrates, less than 200mg  of dietary cholesterol, 20-35 gm of total fiber daily;Understanding of distribution of calorie intake throughout the day with the consumption of 4-5 meals/snacks    Hypertension  Yes    Intervention  Provide education on lifestyle modifcations including regular physical activity/exercise, weight management, moderate sodium restriction and increased consumption of fresh fruit, vegetables, and low fat dairy, alcohol moderation, and smoking cessation.;Monitor prescription use compliance.    Expected Outcomes  Short Term: Continued assessment and intervention until BP is < 140/12mm HG in hypertensive participants. < 130/71mm HG in hypertensive participants with diabetes, heart failure or chronic kidney disease.;Long Term: Maintenance of blood pressure at goal levels.    Lipids  Yes    Intervention  Provide education and support for participant on nutrition & aerobic/resistive exercise along with prescribed medications to achieve LDL 70mg , HDL >40mg .    Expected Outcomes  Short Term: Participant states understanding of desired cholesterol values and is compliant with medications prescribed. Participant is  following exercise prescription and nutrition guidelines.;Long Term: Cholesterol controlled with medications as prescribed, with individualized exercise RX and with personalized nutrition plan. Value goals: LDL < 70mg , HDL > 40 mg.       Core Components/Risk Factors/Patient Goals Review:  Goals and Risk Factor Review    Row Name 09/26/18 1220             Core Components/Risk Factors/Patient Goals Review   Personal Goals Review  Weight Management/Obesity;Hypertension;Lipids       Review  Pt with multiple CAD RFs willing to participate in CR exercise.  [name] is tolerating exercise well. [name] would like to get back to walking more.        Expected Outcomes  Pt will continue to participate in CR exercise, nutrition,  and lifestyle modification opportunities.           Core Components/Risk Factors/Patient Goals at Discharge (Final Review):  Goals and Risk Factor Review - 09/26/18 1220      Core Components/Risk Factors/Patient Goals Review   Personal Goals Review  Weight Management/Obesity;Hypertension;Lipids    Review  Pt with multiple CAD RFs willing to participate in CR exercise.  [name] is tolerating exercise well. [name] would like to get back to walking more.     Expected Outcomes  Pt will continue to participate in CR exercise, nutrition, and lifestyle modification opportunities.        ITP Comments: ITP Comments    Row Name 09/05/18 1406 09/26/18 1214         ITP Comments  Dr. Armanda Magic, Medical Director  30 Day ITP Review. [name] is tolerating exercise well.  She has recently started exercise. VSS.         Comments: See ITP Comments.

## 2018-09-27 ENCOUNTER — Encounter (HOSPITAL_COMMUNITY)
Admission: RE | Admit: 2018-09-27 | Discharge: 2018-09-27 | Disposition: A | Discharge status: Home / Self Care | Payer: Medicare HMO | Source: Ambulatory Visit | Attending: Internal Medicine | Admitting: Internal Medicine

## 2018-09-27 ENCOUNTER — Ambulatory Visit (HOSPITAL_COMMUNITY): Payer: Medicare HMO

## 2018-09-27 DIAGNOSIS — Z951 Presence of aortocoronary bypass graft: Secondary | ICD-10-CM

## 2018-09-30 ENCOUNTER — Encounter (HOSPITAL_COMMUNITY)
Admission: RE | Admit: 2018-09-30 | Discharge: 2018-09-30 | Disposition: A | Discharge status: Home / Self Care | Payer: Medicare HMO | Source: Ambulatory Visit | Attending: Internal Medicine | Admitting: Internal Medicine

## 2018-09-30 ENCOUNTER — Ambulatory Visit (HOSPITAL_COMMUNITY): Payer: Medicare HMO

## 2018-09-30 DIAGNOSIS — Z951 Presence of aortocoronary bypass graft: Secondary | ICD-10-CM | POA: Diagnosis not present

## 2018-10-02 ENCOUNTER — Encounter (HOSPITAL_COMMUNITY)
Admission: RE | Admit: 2018-10-02 | Discharge: 2018-10-02 | Disposition: A | Discharge status: Home / Self Care | Payer: Medicare HMO | Source: Ambulatory Visit | Attending: Internal Medicine | Admitting: Internal Medicine

## 2018-10-02 ENCOUNTER — Ambulatory Visit (HOSPITAL_COMMUNITY): Payer: Medicare HMO

## 2018-10-02 DIAGNOSIS — Z951 Presence of aortocoronary bypass graft: Secondary | ICD-10-CM | POA: Diagnosis not present

## 2018-10-04 ENCOUNTER — Encounter (HOSPITAL_COMMUNITY)
Admission: RE | Admit: 2018-10-04 | Discharge: 2018-10-04 | Disposition: A | Discharge status: Home / Self Care | Payer: Medicare HMO | Source: Ambulatory Visit | Attending: Internal Medicine | Admitting: Internal Medicine

## 2018-10-04 ENCOUNTER — Ambulatory Visit (HOSPITAL_COMMUNITY): Payer: Medicare HMO

## 2018-10-04 DIAGNOSIS — Z951 Presence of aortocoronary bypass graft: Secondary | ICD-10-CM | POA: Diagnosis not present

## 2018-10-07 ENCOUNTER — Ambulatory Visit (HOSPITAL_COMMUNITY): Payer: Medicare HMO

## 2018-10-07 ENCOUNTER — Encounter (HOSPITAL_COMMUNITY)
Admission: RE | Admit: 2018-10-07 | Discharge: 2018-10-07 | Disposition: A | Discharge status: Home / Self Care | Payer: Medicare HMO | Source: Ambulatory Visit | Attending: Internal Medicine | Admitting: Internal Medicine

## 2018-10-07 DIAGNOSIS — Z951 Presence of aortocoronary bypass graft: Secondary | ICD-10-CM | POA: Diagnosis not present

## 2018-10-09 ENCOUNTER — Ambulatory Visit (HOSPITAL_COMMUNITY): Payer: Medicare HMO

## 2018-10-09 ENCOUNTER — Encounter (HOSPITAL_COMMUNITY)
Admission: RE | Admit: 2018-10-09 | Discharge: 2018-10-09 | Disposition: A | Discharge status: Home / Self Care | Payer: Medicare HMO | Source: Ambulatory Visit | Attending: Internal Medicine | Admitting: Internal Medicine

## 2018-10-09 DIAGNOSIS — Z951 Presence of aortocoronary bypass graft: Secondary | ICD-10-CM

## 2018-10-11 ENCOUNTER — Encounter (HOSPITAL_COMMUNITY)
Admission: RE | Admit: 2018-10-11 | Discharge: 2018-10-11 | Disposition: A | Discharge status: Home / Self Care | Payer: Medicare HMO | Source: Ambulatory Visit | Attending: Internal Medicine | Admitting: Internal Medicine

## 2018-10-11 ENCOUNTER — Ambulatory Visit (HOSPITAL_COMMUNITY): Payer: Medicare HMO

## 2018-10-11 DIAGNOSIS — Z951 Presence of aortocoronary bypass graft: Secondary | ICD-10-CM | POA: Diagnosis not present

## 2018-10-14 ENCOUNTER — Ambulatory Visit (HOSPITAL_COMMUNITY): Payer: Medicare HMO

## 2018-10-14 ENCOUNTER — Encounter (HOSPITAL_COMMUNITY)
Admission: RE | Admit: 2018-10-14 | Discharge: 2018-10-14 | Disposition: A | Discharge status: Home / Self Care | Payer: Medicare HMO | Source: Ambulatory Visit | Attending: Internal Medicine | Admitting: Internal Medicine

## 2018-10-14 DIAGNOSIS — Z951 Presence of aortocoronary bypass graft: Secondary | ICD-10-CM

## 2018-10-15 ENCOUNTER — Ambulatory Visit: Payer: Medicare HMO | Admitting: Internal Medicine

## 2018-10-15 VITALS — BP 124/80 | HR 75 | Ht 62.0 in | Wt 158.6 lb

## 2018-10-15 DIAGNOSIS — Z951 Presence of aortocoronary bypass graft: Secondary | ICD-10-CM | POA: Diagnosis not present

## 2018-10-15 DIAGNOSIS — E785 Hyperlipidemia, unspecified: Secondary | ICD-10-CM

## 2018-10-15 DIAGNOSIS — I2510 Atherosclerotic heart disease of native coronary artery without angina pectoris: Secondary | ICD-10-CM | POA: Diagnosis not present

## 2018-10-15 MED ORDER — CLOPIDOGREL BISULFATE 75 MG PO TABS: 75.0000 mg | ORAL_TABLET | Freq: Every day | ORAL | 1 refills | Status: DC

## 2018-10-15 MED ORDER — ROSUVASTATIN CALCIUM 20 MG PO TABS: 20.0000 mg | ORAL_TABLET | Freq: Every day | ORAL | 3 refills | Status: DC

## 2018-10-15 MED ORDER — ISOSORBIDE MONONITRATE ER 30 MG PO TB24: 30.0000 mg | ORAL_TABLET | Freq: Every day | ORAL | 3 refills | Status: DC

## 2018-10-15 MED ORDER — METOPROLOL TARTRATE 25 MG PO TABS: 25.0000 mg | ORAL_TABLET | Freq: Two times a day (BID) | ORAL | 3 refills | Status: DC

## 2018-10-15 NOTE — Progress Notes (Signed)
Cardiology Office Note:    Date:  10/15/2018   ID:  Jaime Chavez, Jaime Chavez 1940/10/12, MRN [id number]  PCP:  Leland Her, DO  Cardiologist:  Parke Poisson, MD  Electrophysiologist:  None   Referring MD: Leland Her, DO   CABG f/u  History of Present Illness:    Jaime Chavez is a 78 y.o. female with a hx of unstable angina s/p CABG x 4 June 14, 2018. She has a history of GERD, HLD.   She is overall well and has new concerns since seeing Dr. Cornelius Moras in early January. She had described at that time a dull chest ache, however as we spoke about this today it seems to follow meals, does not occur with exertion or deep inspiration, and likely represents GERD. We discussed avoidance of trigger foods and seeking an opinion from GI if symptoms do not improve. She is tolerating cardiac rehab well.  The patient denies chest pain, dyspnea at rest or with exertion, palpitations, PND, orthopnea, or leg swelling. Denies syncope or presyncope. Denies dizziness or lightheadedness. Endorses snoring and has not been evaluated for sleep apnea.  Past Medical History:  Diagnosis Date  . Acid reflux   . Coronary artery disease involving native coronary artery of native heart with angina pectoris (HCC) 06/13/2018  . Dyspnea   . Environmental allergies   . Hyperlipidemia 2014  . Left patella fracture 2005  . S/P CABG x 4 06/14/2018   LIMA to LAD, SVG to OM, Sequential SVG to RCA and PDA, EVH from bilateral thighs and RLL    Past Surgical History:  Procedure Laterality Date  . CARDIAC CATHETERIZATION  06/13/2018  . CORONARY ARTERY BYPASS GRAFT N/A 06/14/2018   Procedure: CORONARY ARTERY BYPASS GRAFTING (CABG) times 4 using left internal mammary artery and right saphenous vein using endoscope.;  Surgeon: [name], MD;  Location: [facility] OR;  Service: Open Heart Surgery;  Laterality: N/A;  . DILATION AND CURETTAGE, DIAGNOSTIC / THERAPEUTIC     for nonmalignant polyp  . RIGHT/LEFT HEART CATH AND  CORONARY ANGIOGRAPHY N/A 06/13/2018   Procedure: RIGHT/LEFT HEART CATH AND CORONARY ANGIOGRAPHY;  Surgeon: [name], MD;  Location: [facility] INVASIVE CV LAB;  Service: Cardiovascular;  Laterality: N/A;  . TEE WITHOUT CARDIOVERSION N/A 06/14/2018   Procedure: TRANSESOPHAGEAL ECHOCARDIOGRAM (TEE);  Surgeon: [name], MD;  Location: [facility];  Service: Open Heart Surgery;  Laterality: N/A;    Current Medications: Current Meds  Medication Sig  . acetaminophen (TYLENOL) 325 MG tablet Take 650 mg by mouth every 6 (six) hours as needed.  Marland Kitchen aspirin EC 81 MG tablet Take 1 tablet (81 mg total) by mouth daily.  . calcium gluconate 500 MG tablet Take 1 tablet by mouth 3 (three) times daily.  . clopidogrel (PLAVIX) 75 MG tablet Take 1 tablet (75 mg total) by mouth daily.  . fluticasone (FLONASE) 50 MCG/ACT nasal spray Place into both nostrils daily.  Marland Kitchen glucosamine-chondroitin 500-400 MG tablet Take 1 tablet by mouth 3 (three) times daily.  . isosorbide mononitrate (IMDUR) 30 MG 24 hr tablet Take 1 tablet (30 mg total) by mouth daily.  . rosuvastatin (CRESTOR) 20 MG tablet Take 1 tablet (20 mg total) by mouth daily.  . traMADol (ULTRAM) 50 MG tablet Take by mouth every 6 (six) hours as needed.  . [DISCONTINUED] clopidogrel (PLAVIX) 75 MG tablet Take 1 tablet (75 mg total) by mouth daily.  . [DISCONTINUED] isosorbide mononitrate (IMDUR) 30 MG 24 hr tablet Take 0.5  tablets (15 mg total) by mouth daily.  . [DISCONTINUED] metoprolol tartrate (LOPRESSOR) 25 MG tablet Take 1 tablet (25 mg total) by mouth 2 (two) times daily.  . [DISCONTINUED] metoprolol tartrate (LOPRESSOR) 25 MG tablet Take 1 tablet (25 mg total) by mouth 2 (two) times daily.  . [DISCONTINUED] rosuvastatin (CRESTOR) 20 MG tablet Take 1 tablet (20 mg total) by mouth daily.     Allergies:   Osteoporosis support [a-g pro] and Alendronate sodium   Social History   Socioeconomic History  . Marital status: Married    Spouse name: Not on  file  . Number of children: Not on file  . Years of education: high school graduate  . Highest education level: Not on file  Occupational History  . Occupation: retired  Engineer, productionocial Needs  . Financial resource strain: Not on file  . Food insecurity:    Worry: Not on file    Inability: Not on file  . Transportation needs:    Medical: Not on file    Non-medical: Not on file  Tobacco Use  . Smoking status: Never Smoker  . Smokeless tobacco: Never Used  Substance and Sexual Activity  . Alcohol use: Yes    Comment: wine and liquor, about 1 drink per day  . Drug use: Never  . Sexual activity: Not on file  Lifestyle  . Physical activity:    Days per week: Not on file    Minutes per session: Not on file  . Stress: Not on file  Relationships  . Social connections:    Talks on phone: Not on file    Gets together: Not on file    Attends religious service: Not on file    Active member of club or organization: Not on file    Attends meetings of clubs or organizations: Not on file    Relationship status: Not on file  Other Topics Concern  . Not on file  Social History Narrative   Lives with husband Reggie and dog named Hachi.   Religious or personal believes: [employer]   Has an advance directive, would want her husband to make medical decisions for her if she were unable to do so.   Does not exercise regularly.  Does walk sometimes.   For fun she likes to eat out, travel, spent time with family.     Family History: The patient's family history includes Breast cancer in her paternal aunt; Colon cancer in her sister; Heart attack in her father; Other in her father; Pancreatic cancer in her mother.  ROS:   Please see the history of present illness.    All other systems reviewed and are negative.  EKGs/Labs/Other Studies Reviewed:    The following studies were reviewed today:  EKG:  Not performed today  Recent Labs: 02/25/2018: TSH 4.270 06/13/2018: ALT 33 06/15/2018: Magnesium  2.4 06/18/2018: BUN 17; Creatinine, Ser 0.76; Potassium 5.0; Sodium 141 07/09/2018: Hemoglobin 12.1; Platelets 443  Recent Lipid Panel    Component Value Date/Time   CHOL 151 10/17/2018 0901   TRIG 161 (H) 10/17/2018 0901   HDL 60 10/17/2018 0901   CHOLHDL 2.5 10/17/2018 0901   LDLCALC 59 10/17/2018 0901    Physical Exam:    VS:  BP 124/80   Pulse 75   Ht 5\' 2"  (1.575 m)   Wt 158 lb 9.6 oz (71.9 kg)   BMI 29.01 kg/m     Wt Readings from Last 3 Encounters:  10/15/18 158 lb 9.6 oz (71.9 kg)  09/05/18 155 lb 13.8 oz (70.7 kg)  09/02/18 158 lb (71.7 kg)     Constitutional: No acute distress Cardiovascular: regular rhythm, normal rate, no murmurs. S1 and S2 normal. Radial pulses normal bilaterally. No jugular venous distention.  Respiratory: clear to auscultation bilaterally GI : normal bowel sounds, soft and nontender. No distention.   MSK: extremities warm, well perfused. No edema.  NEURO: grossly nonfocal exam, moves all extremities. PSYCH: alert and oriented x 3, normal mood and affect.  ASSESSMENT:    1. S/P CABG x 4   2. Hyperlipidemia, unspecified hyperlipidemia type   3. Coronary artery disease involving native coronary artery of native heart without angina pectoris    PLAN:    We will refill medications today and plan for follow up. We will increase Imdur and monitor for response. If this improves her chest pain we can continue to discuss uptitration of medical therapy.   Lipids were significantly elevated prior to CABG and statin therapy. We will repeat this to ensure treatment effect and reaching goal.   Medication Adjustments/Labs and Tests Ordered: Current medicines are reviewed at length with the patient today.  Concerns regarding medicines are outlined above.  Orders Placed This Encounter  Procedures  . Lipid panel   Meds ordered this encounter  Medications  . clopidogrel (PLAVIX) 75 MG tablet    Sig: Take 1 tablet (75 mg total) by mouth daily.     Dispense:  30 tablet    Refill:  1  . isosorbide mononitrate (IMDUR) 30 MG 24 hr tablet    Sig: Take 1 tablet (30 mg total) by mouth daily.    Dispense:  90 tablet    Refill:  3  . rosuvastatin (CRESTOR) 20 MG tablet    Sig: Take 1 tablet (20 mg total) by mouth daily.    Dispense:  90 tablet    Refill:  3  . DISCONTD: metoprolol tartrate (LOPRESSOR) 25 MG tablet    Sig: Take 1 tablet (25 mg total) by mouth 2 (two) times daily.    Dispense:  180 tablet    Refill:  3    Patient Instructions  Medication Instructions:  Your physician has recommended you make the following change in your medication:   INCREASE Imdur to 30mg  daily. An Rx has been sent to your pharmacy  We have refilled Plavix, Metoprolol, and Crestor today.  If you need a refill on your cardiac medications before your next appointment, please call your pharmacy.   Lab work: Your physician recommends that you return for a FASTING lipid profile asap  If you have labs (blood work) drawn today and your tests are completely normal, you will receive your results only by: Marland Kitchen MyChart Message (if you have MyChart) OR . A paper copy in the mail If you have any lab test that is abnormal or we need to change your treatment, we will call you to review the results.  Testing/Procedures: no  Follow-Up: At [facility], you and your health needs are our priority.  As part of our continuing mission to provide you with exceptional heart care, we have created designated Provider Care Teams.  These Care Teams include your primary Cardiologist (physician) and Advanced Practice Providers (APPs -  Physician Assistants and Nurse Practitioners) who all work together to provide you with the care you need, when you need it. You will need a follow up appointment in 3 months.     You may see Parke Poisson, MD or one  of the following Advanced Practice Providers on your designated Care Team:   Theodore Demark, PA-C . Joni Reining, DNP,  ANP  Any Other Special Instructions Will Be Listed Below (If Applicable). Dr. Jacques Navy has been recommended Famotidine (Pepcid) for heartburn      Signed, Parke Poisson, MD  10/15/2018 7:13 AM    [facility]

## 2018-10-15 NOTE — Patient Instructions (Signed)
Medication Instructions:  Your physician has recommended you make the following change in your medication:   INCREASE Imdur to 30mg  daily. An Rx has been sent to your pharmacy  We have refilled Plavix, Metoprolol, and Crestor today.  If you need a refill on your cardiac medications before your next appointment, please call your pharmacy.   Lab work: Your physician recommends that you return for a FASTING lipid profile asap  If you have labs (blood work) drawn today and your tests are completely normal, you will receive your results only by: Marland Kitchen MyChart Message (if you have MyChart) OR . A paper copy in the mail If you have any lab test that is abnormal or we need to change your treatment, we will call you to review the results.  Testing/Procedures: no  Follow-Up: At [facility], you and your health needs are our priority.  As part of our continuing mission to provide you with exceptional heart care, we have created designated Provider Care Teams.  These Care Teams include your primary Cardiologist (physician) and Advanced Practice Providers (APPs -  Physician Assistants and Nurse Practitioners) who all work together to provide you with the care you need, when you need it. You will need a follow up appointment in 3 months.     You may see Parke Poisson, MD or one of the following Advanced Practice Providers on your designated Care Team:   Theodore Demark, PA-C . Joni Reining, DNP, ANP  Any Other Special Instructions Will Be Listed Below (If Applicable). Dr. Jacques Navy has been recommended Famotidine (Pepcid) for heartburn

## 2018-10-16 ENCOUNTER — Ambulatory Visit (HOSPITAL_COMMUNITY): Payer: Medicare HMO

## 2018-10-16 ENCOUNTER — Encounter (HOSPITAL_COMMUNITY)
Admission: RE | Admit: 2018-10-16 | Discharge: 2018-10-16 | Disposition: A | Discharge status: Home / Self Care | Payer: Medicare HMO | Source: Ambulatory Visit | Attending: Internal Medicine | Admitting: Internal Medicine

## 2018-10-16 DIAGNOSIS — Z951 Presence of aortocoronary bypass graft: Secondary | ICD-10-CM | POA: Diagnosis not present

## 2018-10-17 ENCOUNTER — Other Ambulatory Visit: Payer: Self-pay | Admitting: *Deleted

## 2018-10-17 DIAGNOSIS — E785 Hyperlipidemia, unspecified: Secondary | ICD-10-CM | POA: Diagnosis not present

## 2018-10-17 LAB — LIPID PANEL
CHOLESTEROL TOTAL: 151 mg/dL (ref 100–199)
Chol/HDL Ratio: 2.5 ratio (ref 0.0–4.4)
HDL: 60 mg/dL (ref >39)
LDL Calculated: 59 mg/dL (ref 0–99)
TRIGLYCERIDES: 161 mg/dL — AB (ref 0–149)
VLDL Cholesterol Cal: 32 mg/dL (ref 5–40)

## 2018-10-17 MED ORDER — METOPROLOL TARTRATE 25 MG PO TABS: 25.0000 mg | ORAL_TABLET | Freq: Two times a day (BID) | ORAL | 3 refills | Status: DC

## 2018-10-18 ENCOUNTER — Ambulatory Visit (HOSPITAL_COMMUNITY): Payer: Medicare HMO

## 2018-10-18 ENCOUNTER — Encounter (HOSPITAL_COMMUNITY): Payer: Medicare HMO

## 2018-10-21 ENCOUNTER — Encounter (HOSPITAL_COMMUNITY)
Admission: RE | Admit: 2018-10-21 | Discharge: 2018-10-21 | Disposition: A | Discharge status: Home / Self Care | Payer: Medicare HMO | Source: Ambulatory Visit | Attending: Internal Medicine | Admitting: Internal Medicine

## 2018-10-21 ENCOUNTER — Ambulatory Visit (HOSPITAL_COMMUNITY): Payer: Medicare HMO

## 2018-10-21 DIAGNOSIS — Z951 Presence of aortocoronary bypass graft: Secondary | ICD-10-CM | POA: Diagnosis not present

## 2018-10-23 ENCOUNTER — Ambulatory Visit (HOSPITAL_COMMUNITY): Payer: Medicare HMO

## 2018-10-23 ENCOUNTER — Encounter (HOSPITAL_COMMUNITY)
Admission: RE | Admit: 2018-10-23 | Discharge: 2018-10-23 | Disposition: A | Discharge status: Home / Self Care | Payer: Medicare HMO | Source: Ambulatory Visit | Attending: Internal Medicine | Admitting: Internal Medicine

## 2018-10-23 DIAGNOSIS — Z951 Presence of aortocoronary bypass graft: Secondary | ICD-10-CM | POA: Diagnosis not present

## 2018-10-24 NOTE — Progress Notes (Signed)
Cardiac Individual Treatment Plan  Patient Details  Name: Jaime Chavez MRN: [id number] Date of Birth: 04/12/1941 Referring Provider:     CARDIAC REHAB PHASE II ORIENTATION from 09/05/2018 in [facility] CARDIAC REHAB  Referring Provider  Parke Poisson MD       Initial Encounter Date:    CARDIAC REHAB PHASE II ORIENTATION from 09/05/2018 in [facility] CARDIAC REHAB  Date  09/05/18      Visit Diagnosis: S/P CABG x 4  Patient's Home Medications on Admission:  Current Outpatient Medications:  .  acetaminophen (TYLENOL) 325 MG tablet, Take 650 mg by mouth every 6 (six) hours as needed., Disp: , Rfl:  .  aspirin EC 81 MG tablet, Take 1 tablet (81 mg total) by mouth daily., Disp: 60 tablet, Rfl: 2 .  calcium gluconate 500 MG tablet, Take 1 tablet by mouth 3 (three) times daily., Disp: , Rfl:  .  clopidogrel (PLAVIX) 75 MG tablet, Take 1 tablet (75 mg total) by mouth daily., Disp: 30 tablet, Rfl: 1 .  fluticasone (FLONASE) 50 MCG/ACT nasal spray, Place into both nostrils daily., Disp: , Rfl:  .  glucosamine-chondroitin 500-400 MG tablet, Take 1 tablet by mouth 3 (three) times daily., Disp: , Rfl:  .  isosorbide mononitrate (IMDUR) 30 MG 24 hr tablet, Take 1 tablet (30 mg total) by mouth daily., Disp: 90 tablet, Rfl: 3 .  metoprolol tartrate (LOPRESSOR) 25 MG tablet, Take 1 tablet (25 mg total) by mouth 2 (two) times daily., Disp: 180 tablet, Rfl: 3 .  rosuvastatin (CRESTOR) 20 MG tablet, Take 1 tablet (20 mg total) by mouth daily., Disp: 90 tablet, Rfl: 3 .  traMADol (ULTRAM) 50 MG tablet, Take by mouth every 6 (six) hours as needed., Disp: , Rfl:   Past Medical History: Past Medical History:  Diagnosis Date  . Acid reflux   . Coronary artery disease involving native coronary artery of native heart with angina pectoris (HCC) 06/13/2018  . Dyspnea   . Environmental allergies   . Hyperlipidemia 2014  . Left patella fracture 2005  . S/P CABG x 4  06/14/2018   LIMA to LAD, SVG to OM, Sequential SVG to RCA and PDA, EVH from bilateral thighs and RLL    Tobacco Use: Social History   Tobacco Use  Smoking Status Never Smoker  Smokeless Tobacco Never Used    Labs: Recent Review Flowsheet Data    Labs for ITP Cardiac and Pulmonary Rehab Latest Ref Rng & Units 06/14/2018 06/14/2018 06/14/2018 06/15/2018 10/17/2018   Cholestrol 100 - 199 mg/dL - - - - 409   LDLCALC 0 - 99 mg/dL - - - - 59   HDL >81 mg/dL - - - - 60   Trlycerides 0 - 149 mg/dL - - - - 191(Y)   Hemoglobin A1c 4.8 - 5.6 % - - - - -   PHART 7.350 - 7.450 7.331(L) - 7.333(L) 7.368 -   PCO2ART 32.0 - 48.0 mmHg 37.9 - 35.9 35.8 -   HCO3 20.0 - 28.0 mmol/L 20.0 - 19.0(L) 20.1 -   TCO2 22 - 32 mmol/L 21(L) 20(L) 20(L) 24 -   ACIDBASEDEF 0.0 - 2.0 mmol/L 5.0(H) - 6.0(H) 4.3(H) -   O2SAT % 99.0 - 99.0 97.9 -      Capillary Blood Glucose: Lab Results  Component Value Date   GLUCAP 149 (H) 06/17/2018   GLUCAP 137 (H) 06/17/2018   GLUCAP 134 (H) 06/16/2018   GLUCAP 138 (H) 06/16/2018  GLUCAP 139 (H) 06/16/2018     Exercise Target Goals: Exercise Program Goal: Individual exercise prescription set using results from initial 6 min walk test and THRR while considering  patient's activity barriers and safety.   Exercise Prescription Goal: Initial exercise prescription builds to 30-45 minutes a day of aerobic activity, 2-3 days per week.  Home exercise guidelines will be given to patient during program as part of exercise prescription that the participant will acknowledge.  Activity Barriers & Risk Stratification: Activity Barriers & Cardiac Risk Stratification - 09/05/18 1503      Activity Barriers & Cardiac Risk Stratification   Activity Barriers  Deconditioning;Incisional Pain;Muscular Weakness    Cardiac Risk Stratification  High       6 Minute Walk: 6 Minute Walk    Row Name 09/05/18 1519         6 Minute Walk   Phase  Initial     Distance  1210 feet      Walk Time  6 minutes     # of Rest Breaks  1     MPH  2.29     METS  2.24     RPE  11     Perceived Dyspnea   0     VO2 Peak  7.84     Symptoms  Yes (comment)     Comments  Pt stopped for rest break at 5 minutes and 51 seconds. Pt rested for 8 seconds. Started at 5.59.      Resting HR  66 bpm     Resting BP  118/70     Resting Oxygen Saturation   98 %     Exercise Oxygen Saturation  during 6 min walk  98 %     Max Ex. HR  98 bpm     Max Ex. BP  122/72     2 Minute Post BP  122/76        Oxygen Initial Assessment:   Oxygen Re-Evaluation:   Oxygen Discharge (Final Oxygen Re-Evaluation):   Initial Exercise Prescription: Initial Exercise Prescription - 09/05/18 1500      Date of Initial Exercise RX and Referring Provider   Date  09/05/18    Referring Provider  Weston Brass A MD     Expected Discharge Date  12/13/18      Recumbant Bike   Level  1    Watts  10    Minutes  10    METs  1.8      NuStep   Level  2    SPM  75    Minutes  10    METs  2      Track   Laps  8    Minutes  10    METs  2.38      Prescription Details   Frequency (times per week)  3x    Duration  Progress to 30 minutes of continuous aerobic without signs/symptoms of physical distress      Intensity   THRR 40-80% of Max Heartrate  58-116    Ratings of Perceived Exertion  11-13    Perceived Dyspnea  0-4      Progression   Progression  Continue progressive overload as per policy without signs/symptoms or physical distress.      Resistance Training   Training Prescription  Yes    Weight  1lbs    Reps  10-15       Perform Capillary Blood Glucose checks as needed.  Exercise Prescription Changes: Exercise Prescription Changes    Row Name 09/11/18 1200 09/20/18 1036 09/30/18 1449 10/11/18 1450       Response to Exercise   Blood Pressure (Admit)  108/82  114/76  134/80  118/70    Blood Pressure (Exercise)  132/80  132/64  112/78  130/70    Blood Pressure (Exit)  114/70   104/68  110/70  102/68    Heart Rate (Admit)  78 bpm  80 bpm  79 bpm  72 bpm    Heart Rate (Exercise)  101 bpm  96 bpm  97 bpm  94 bpm    Heart Rate (Exit)  78 bpm  79 bpm  77 bpm  72 bpm    Rating of Perceived Exertion (Exercise)  13  12  12  12     Perceived Dyspnea (Exercise)  0  0  0  0    Symptoms  None  None  None  None    Comments  Pt oriented to exercise equipment  None  None  None    Duration  Progress to 30 minutes of  aerobic without signs/symptoms of physical distress  Progress to 30 minutes of  aerobic without signs/symptoms of physical distress  Progress to 30 minutes of  aerobic without signs/symptoms of physical distress  Progress to 30 minutes of  aerobic without signs/symptoms of physical distress    Intensity  THRR unchanged  THRR unchanged  THRR unchanged  THRR unchanged      Progression   Progression  Continue to progress workloads to maintain intensity without signs/symptoms of physical distress.  Continue to progress workloads to maintain intensity without signs/symptoms of physical distress.  Continue to progress workloads to maintain intensity without signs/symptoms of physical distress.  Continue to progress workloads to maintain intensity without signs/symptoms of physical distress.    Average METs  2.31  2.4  2.4  2.4      Resistance Training   Training Prescription  No  Yes  Yes  Yes    Weight  -  1lbs  2lbs  2lbs    Reps  -  10-15  10-15  10-15    Time  -  10 Minutes  10 Minutes  10 Minutes      Recumbant Bike   Level  1  1  1  1     Watts  10  10  10  10     Minutes  10  10  10  10     METs  2.2  2  2.2  2      NuStep   Level  2  2  2  2     SPM  75  85  85  85    Minutes  10  10  10  10     METs  2.2  2.3  2.2  2.4      Track   Laps  9  11  9  11     Minutes  10  10  10  10     METs  2.45  2.91  2.53  2.91      Home Exercise Plan   Plans to continue exercise at  -  Home (comment) Walking & Recumbent Bike   Home (comment) Walking & Recumbent Bike   Home  (comment) Walking & Recumbent Bike     Frequency  -  Add 2 additional days to program exercise sessions.  Add 2 additional days to program exercise sessions.  Add 2 additional days to program exercise sessions.    Initial Home Exercises Provided  -  09/20/18  09/20/18  09/20/18       Exercise Comments: Exercise Comments    Row Name 09/11/18 1240 09/23/18 1648 10/24/18 1501       Exercise Comments  Pt's first day of exercise. Pt oriented to exercise equipment and responded well to workloads. Will continue to monitor and progress as tolerated.   Reviewed HEP with pt. Pt verbalized understanding. Will continue to monitor and progress pt as tolerated.   Reviewed METs and Goals with pt. Pt is continuing to respond well to exercise prescription. Will continue to monitor and progress pt as tolerated.         Exercise Goals and Review: Exercise Goals    Row Name 09/05/18 1407             Exercise Goals   Increase Physical Activity  Yes       Intervention  Provide advice, education, support and counseling about physical activity/exercise needs.;Develop an individualized exercise prescription for aerobic and resistive training based on initial evaluation findings, risk stratification, comorbidities and participant's personal goals.       Expected Outcomes  Short Term: Attend rehab on a regular basis to increase amount of physical activity.;Long Term: Exercising regularly at least 3-5 days a week.;Long Term: Add in home exercise to make exercise part of routine and to increase amount of physical activity.       Increase Strength and Stamina  Yes       Intervention  Provide advice, education, support and counseling about physical activity/exercise needs.;Develop an individualized exercise prescription for aerobic and resistive training based on initial evaluation findings, risk stratification, comorbidities and participant's personal goals.       Expected Outcomes  Short Term: Perform resistance  training exercises routinely during rehab and add in resistance training at home;Long Term: Improve cardiorespiratory fitness, muscular endurance and strength as measured by increased METs and functional capacity ( );Short Term: Increase workloads from initial exercise prescription for resistance, speed, and METs.       Able to understand and use rate of perceived exertion (RPE) scale  Yes       Intervention  Provide education and explanation on how to use RPE scale       Expected Outcomes  Short Term: Able to use RPE daily in rehab to express subjective intensity level;Long Term:  Able to use RPE to guide intensity level when exercising independently       Knowledge and understanding of Target Heart Rate Range (THRR)  Yes       Intervention  Provide education and explanation of THRR including how the numbers were predicted and where they are located for reference       Expected Outcomes  Short Term: Able to state/look up THRR;Long Term: Able to use THRR to govern intensity when exercising independently;Short Term: Able to use daily as guideline for intensity in rehab       Able to check pulse independently  Yes       Intervention  Provide education and demonstration on how to check pulse in carotid and radial arteries.;Review the importance of being able to check your own pulse for safety during independent exercise       Expected Outcomes  Short Term: Able to explain why pulse checking is important during independent exercise;Long Term: Able to check pulse independently and accurately       Understanding of  Exercise Prescription  Yes       Intervention  Provide education, explanation, and written materials on patient's individual exercise prescription       Expected Outcomes  Short Term: Able to explain program exercise prescription;Long Term: Able to explain home exercise prescription to exercise independently          Exercise Goals Re-Evaluation : Exercise Goals Re-Evaluation    Row Name  09/23/18 1648 10/24/18 1503           Exercise Goal Re-Evaluation   Exercise Goals Review  Increase Physical Activity;Able to understand and use rate of perceived exertion (RPE) scale;Knowledge and understanding of Target Heart Rate Range (THRR);Understanding of Exercise Prescription;Increase Strength and Stamina;Able to check pulse independently  Increase Physical Activity;Understanding of Exercise Prescription      Comments  Reviewed HEP with pt. Also reviewed THRR, RPE Scale, weather conditions, endpoints of exericse, NTG use/when to call 911, warmup and cool down.   Pt is responding well to exercise prescription. Pt is enjoying program and is working towards her goals of increasing her strength and stamina.        Expected Outcomes  Pt will plan to walk and use home exercise bike 2-3 days a week for 30-45 minutes. Pt will continue to increase cardiovascular strength. Will continue to monitor and progress pt.   Pt is not exercising consistenly at home. Encouraged pt to walk or use home exercise bike 2-3 days a week, for 30 minutes. Pt stated she will work towards that. Will continue to monitor and progress pt.          Discharge Exercise Prescription (Final Exercise Prescription Changes): Exercise Prescription Changes - 10/11/18 1450      Response to Exercise   Blood Pressure (Admit)  118/70    Blood Pressure (Exercise)  130/70    Blood Pressure (Exit)  102/68    Heart Rate (Admit)  72 bpm    Heart Rate (Exercise)  94 bpm    Heart Rate (Exit)  72 bpm    Rating of Perceived Exertion (Exercise)  12    Perceived Dyspnea (Exercise)  0    Symptoms  None    Comments  None    Duration  Progress to 30 minutes of  aerobic without signs/symptoms of physical distress    Intensity  THRR unchanged      Progression   Progression  Continue to progress workloads to maintain intensity without signs/symptoms of physical distress.    Average METs  2.4      Resistance Training   Training  Prescription  Yes    Weight  2lbs    Reps  10-15    Time  10 Minutes      Recumbant Bike   Level  1    Watts  10    Minutes  10    METs  2      NuStep   Level  2    SPM  85    Minutes  10    METs  2.4      Track   Laps  11    Minutes  10    METs  2.91      Home Exercise Plan   Plans to continue exercise at  Home (comment)   Walking & Recumbent Bike    Frequency  Add 2 additional days to program exercise sessions.    Initial Home Exercises Provided  09/20/18       Nutrition:  Target Goals: Understanding of nutrition guidelines, daily intake of sodium 1500mg , cholesterol 200mg , calories 30% from fat and 7% or less from saturated fats, daily to have 5 or more servings of fruits and vegetables.  Biometrics: Pre Biometrics - 09/05/18 1502      Pre Biometrics   Height   (1.575 m)    Weight  70.7 kg    Waist Circumference  40 inches    Hip Circumference  43 inches    Waist to Hip Ratio  0.93 %    BMI (Calculated)  28.5    Triceps Skinfold  32 mm    % Body Fat  42.8 %    Grip Strength  23.5 kg    Flexibility  12 in    Single Leg Stand  11 seconds        Nutrition Therapy Plan and Nutrition Goals: Nutrition Therapy & Goals - 09/05/18 1605      Nutrition Therapy   Diet  heart healthy, carb modified      Personal Nutrition Goals   Nutrition Goal  Pt to identify and limit food sources of saturated fat, trans fat, refined carbohydrates and sodium    Personal Goal #2  Pt to identify food quantities necessary to achieve weight loss of 6-24 lbs. at graduation from cardiac rehab.     Personal Goal #3  Pt able to name foods that affect blood glucose.      Intervention Plan   Intervention  Prescribe, educate and counsel regarding individualized specific dietary modifications aiming towards targeted core components such as weight, hypertension, lipid management, diabetes, heart failure and other comorbidities.    Expected Outcomes  Short Term Goal: Understand basic  principles of dietary content, such as calories, fat, sodium, cholesterol and nutrients.;Long Term Goal: Adherence to prescribed nutrition plan.       Nutrition Assessments: Nutrition Assessments - 09/05/18 1604      MEDFICTS Scores   Pre Score  46       Nutrition Goals Re-Evaluation: Nutrition Goals Re-Evaluation    Row Name 09/05/18 1600             Goals   Current Weight  155 lb 13.8 oz (70.7 kg)          Nutrition Goals Re-Evaluation: Nutrition Goals Re-Evaluation    Row Name 09/05/18 1600             Goals   Current Weight  155 lb 13.8 oz (70.7 kg)          Nutrition Goals Discharge (Final Nutrition Goals Re-Evaluation): Nutrition Goals Re-Evaluation - 09/05/18 1600      Goals   Current Weight  155 lb 13.8 oz (70.7 kg)       Psychosocial: Target Goals: Acknowledge presence or absence of significant depression and/or stress, maximize coping skills, provide positive support system. Participant is able to verbalize types and ability to use techniques and skills needed for reducing stress and depression.  Initial Review & Psychosocial Screening: Initial Psych Review & Screening - 09/05/18 1539      Initial Review   Current issues with  None Identified      Family Dynamics   Good Support System?  Yes   Pt lists her spouse and son as sources of support.  Her husband was present at today's orientation session.      Barriers   Psychosocial barriers to participate in program  There are no identifiable barriers or psychosocial needs.  Screening Interventions   Interventions  Encouraged to exercise       Quality of Life Scores: Quality of Life - 09/05/18 1524      Quality of Life   Select  Quality of Life      Quality of Life Scores   Health/Function Pre  20.77 %    Socioeconomic Pre  26.36 %    Psych/Spiritual Pre  27.86 %    Family Pre  27.6 %    GLOBAL Pre  24.38 %      Scores of 19 and below usually indicate a poorer quality of life  in these areas.  A difference of  2-3 points is a clinically meaningful difference.  A difference of 2-3 points in the total score of the Quality of Life Index has been associated with significant improvement in overall quality of life, self-image, physical symptoms, and general health in studies assessing change in quality of life.  PHQ-9: Recent Review Flowsheet Data    Depression screen [facility] 2/9 05/09/2018 02/25/2018   Decreased Interest 0 0   Down, Depressed, Hopeless 0 0   PHQ - 2 Score 0 0     Interpretation of Total Score  Total Score Depression Severity:  1-4 = Minimal depression, 5-9 = Mild depression, 10-14 = Moderate depression, 15-19 = Moderately severe depression, 20-27 = Severe depression   Psychosocial Evaluation and Intervention: Psychosocial Evaluation - 09/26/18 1215      Psychosocial Evaluation & Interventions   Interventions  Encouraged to exercise with the program and follow exercise prescription    Comments  No psychosocial interventions.    Expected Outcomes  [name] will maintain a positive outlook with good coping skills.    Continue Psychosocial Services   No Follow up required       Psychosocial Re-Evaluation: Psychosocial Re-Evaluation    Row Name 09/26/18 1215 10/24/18 1518           Psychosocial Re-Evaluation   Current issues with  None Identified  None Identified      Comments  No psychosocial interventions necessary.  No psychosocial interventions necessary.      Expected Outcomes  [name] will maintain a positive outlook with good coping skills.  [name] will maintain a positive outlook with good coping skills.      Interventions  Encouraged to attend Cardiac Rehabilitation for the exercise  Encouraged to attend Cardiac Rehabilitation for the exercise      Continue Psychosocial Services   No Follow up required  No Follow up required         Psychosocial Discharge (Final Psychosocial Re-Evaluation): Psychosocial Re-Evaluation - 10/24/18 1518       Psychosocial Re-Evaluation   Current issues with  None Identified    Comments  No psychosocial interventions necessary.    Expected Outcomes  [name] will maintain a positive outlook with good coping skills.    Interventions  Encouraged to attend Cardiac Rehabilitation for the exercise    Continue Psychosocial Services   No Follow up required       Vocational Rehabilitation: Provide vocational rehab assistance to qualifying candidates.   Vocational Rehab Evaluation & Intervention: Vocational Rehab - 09/05/18 1540      Initial Vocational Rehab Evaluation & Intervention   Assessment shows need for Vocational Rehabilitation  No       Education: Education Goals: Education classes will be provided on a weekly basis, covering required topics. Participant will state understanding/return demonstration of topics presented.  Learning Barriers/Preferences: Learning Barriers/Preferences -  09/05/18 1504      Learning Barriers/Preferences   Learning Barriers  None    Learning Preferences  Verbal Instruction;Skilled Demonstration;Written Material       Education Topics: Count Your Pulse:  -Group instruction provided by verbal instruction, demonstration, patient participation and written materials to support subject.  Instructors address importance of being able to find your pulse and how to count your pulse when at home without a heart monitor.  Patients get hands on experience counting their pulse with staff help and individually.   CARDIAC REHAB PHASE II EXERCISE from 09/13/2018 in [facility] CARDIAC REHAB  Date  09/13/18  Educator  RN  Instruction Review Code  2- Demonstrated Understanding      Heart Attack, Angina, and Risk Factor Modification:  -Group instruction provided by verbal instruction, video, and written materials to support subject.  Instructors address signs and symptoms of angina and heart attacks.    Also discuss risk factors for heart disease and how  to make changes to improve heart health risk factors.   Functional Fitness:  -Group instruction provided by verbal instruction, demonstration, patient participation, and written materials to support subject.  Instructors address safety measures for doing things around the house.  Discuss how to get up and down off the floor, how to pick things up properly, how to safely get out of a chair without assistance, and balance training.   Meditation and Mindfulness:  -Group instruction provided by verbal instruction, patient participation, and written materials to support subject.  Instructor addresses importance of mindfulness and meditation practice to help reduce stress and improve awareness.  Instructor also leads participants through a meditation exercise.    CARDIAC REHAB PHASE II EXERCISE from 10/23/2018 in [facility] CARDIAC REHAB  Date  10/23/18  Educator  RN  Instruction Review Code  2- Demonstrated Understanding      Stretching for Flexibility and Mobility:  -Group instruction provided by verbal instruction, patient participation, and written materials to support subject.  Instructors lead participants through series of stretches that are designed to increase flexibility thus improving mobility.  These stretches are additional exercise for major muscle groups that are typically performed during regular warm up and cool down.   Hands Only CPR:  -Group verbal, video, and participation provides a basic overview of AHA guidelines for community CPR. Role-play of emergencies allow participants the opportunity to practice calling for help and chest compression technique with discussion of AED use.   Hypertension: -Group verbal and written instruction that provides a basic overview of hypertension including the most recent diagnostic guidelines, risk factor reduction with self-care instructions and medication management.   CARDIAC REHAB PHASE II EXERCISE from 10/23/2018 in [facility] CARDIAC REHAB  Date  09/27/18  Educator  RN  Instruction Review Code  2- Demonstrated Understanding       Nutrition I class: Heart Healthy Eating:  -Group instruction provided by PowerPoint slides, verbal discussion, and written materials to support subject matter. The instructor gives an explanation and review of the Therapeutic Lifestyle Changes diet recommendations, which includes a discussion on lipid goals, dietary fat, sodium, fiber, plant stanol/sterol esters, sugar, and the components of a well-balanced, healthy diet.   Nutrition II class: Lifestyle Skills:  -Group instruction provided by PowerPoint slides, verbal discussion, and written materials to support subject matter. The instructor gives an explanation and review of label reading, grocery shopping for heart health, heart healthy recipe modifications, and ways to make healthier  choices when eating out.   Diabetes Question & Answer:  -Group instruction provided by PowerPoint slides, verbal discussion, and written materials to support subject matter. The instructor gives an explanation and review of diabetes co-morbidities, pre- and post-prandial blood glucose goals, pre-exercise blood glucose goals, signs, symptoms, and treatment of hypoglycemia and hyperglycemia, and foot care basics.   Diabetes Blitz:  -Group instruction provided by PowerPoint slides, verbal discussion, and written materials to support subject matter. The instructor gives an explanation and review of the physiology behind type 1 and type 2 diabetes, diabetes medications and rational behind using different medications, pre- and post-prandial blood glucose recommendations and Hemoglobin A1c goals, diabetes diet, and exercise including blood glucose guidelines for exercising safely.    Portion Distortion:  -Group instruction provided by PowerPoint slides, verbal discussion, written materials, and food models to support subject matter. The  instructor gives an explanation of serving size versus portion size, changes in portions sizes over the last 20 years, and what consists of a serving from each food group.   Stress Management:  -Group instruction provided by verbal instruction, video, and written materials to support subject matter.  Instructors review role of stress in heart disease and how to cope with stress positively.     CARDIAC REHAB PHASE II EXERCISE from 10/23/2018 in [facility] CARDIAC REHAB  Date  09/18/18  Educator  RN  Instruction Review Code  2- Demonstrated Understanding      Exercising on Your Own:  -Group instruction provided by verbal instruction, power point, and written materials to support subject.  Instructors discuss benefits of exercise, components of exercise, frequency and intensity of exercise, and end points for exercise.  Also discuss use of nitroglycerin and activating EMS.  Review options of places to exercise outside of rehab.  Review guidelines for sex with heart disease.   CARDIAC REHAB PHASE II EXERCISE from 10/23/2018 in [facility] CARDIAC REHAB  Date  10/16/18  Educator  EP  Instruction Review Code  2- Demonstrated Understanding      Cardiac Drugs I:  -Group instruction provided by verbal instruction and written materials to support subject.  Instructor reviews cardiac drug classes: antiplatelets, anticoagulants, beta blockers, and statins.  Instructor discusses reasons, side effects, and lifestyle considerations for each drug class.   Cardiac Drugs II:  -Group instruction provided by verbal instruction and written materials to support subject.  Instructor reviews cardiac drug classes: angiotensin converting enzyme inhibitors (ACE-I), angiotensin II receptor blockers (ARBs), nitrates, and calcium channel blockers.  Instructor discusses reasons, side effects, and lifestyle considerations for each drug class.   CARDIAC REHAB PHASE II EXERCISE from 10/23/2018  in [facility] CARDIAC REHAB  Date  10/02/18  Instruction Review Code  2- Demonstrated Understanding      Anatomy and Physiology of the Circulatory System:  Group verbal and written instruction and models provide basic cardiac anatomy and physiology, with the coronary electrical and arterial systems. Review of: AMI, Angina, Valve disease, Heart Failure, Peripheral Artery Disease, Cardiac Arrhythmia, Pacemakers, and the ICD.   CARDIAC REHAB PHASE II EXERCISE from 10/23/2018 in [facility] CARDIAC REHAB  Date  09/25/18  Educator  RN  Instruction Review Code  2- Demonstrated Understanding      Other Education:  -Group or individual verbal, written, or video instructions that support the educational goals of the cardiac rehab program.   Holiday Eating Survival Tips:  -Group instruction provided by PowerPoint slides, verbal discussion, and  written materials to support subject matter. The instructor gives patients tips, tricks, and techniques to help them not only survive but enjoy the holidays despite the onslaught of food that accompanies the holidays.   Knowledge Questionnaire Score: Knowledge Questionnaire Score - 09/05/18 1407      Knowledge Questionnaire Score   Pre Score  20/24       Core Components/Risk Factors/Patient Goals at Admission: Personal Goals and Risk Factors at Admission - 09/05/18 1503      Core Components/Risk Factors/Patient Goals on Admission    Weight Management  Yes;Weight Loss    Intervention  Weight Management: Develop a combined nutrition and exercise program designed to reach desired caloric intake, while maintaining appropriate intake of nutrient and fiber, sodium and fats, and appropriate energy expenditure required for the weight goal.;Weight Management: Provide education and appropriate resources to help participant work on and attain dietary goals.;Weight Management/Obesity: Establish reasonable short term and long term  weight goals.    Admit Weight  155 lb 13.8 oz (70.7 kg)    Goal Weight: Long Term  145 lb (65.8 kg)    Expected Outcomes  Short Term: Continue to assess and modify interventions until short term weight is achieved;Long Term: Adherence to nutrition and physical activity/exercise program aimed toward attainment of established weight goal;Weight Loss: Understanding of general recommendations for a balanced deficit meal plan, which promotes 1-2 lb weight loss per week and includes a negative energy balance of [phone] kcal/d;Understanding recommendations for meals to include 15-35% energy as protein, 25-35% energy from fat, 35-60% energy from carbohydrates, less than 200mg  of dietary cholesterol, 20-35 gm of total fiber daily;Understanding of distribution of calorie intake throughout the day with the consumption of 4-5 meals/snacks    Hypertension  Yes    Intervention  Provide education on lifestyle modifcations including regular physical activity/exercise, weight management, moderate sodium restriction and increased consumption of fresh fruit, vegetables, and low fat dairy, alcohol moderation, and smoking cessation.;Monitor prescription use compliance.    Expected Outcomes  Short Term: Continued assessment and intervention until BP is < 140/49mm HG in hypertensive participants. < 130/30mm HG in hypertensive participants with diabetes, heart failure or chronic kidney disease.;Long Term: Maintenance of blood pressure at goal levels.    Lipids  Yes    Intervention  Provide education and support for participant on nutrition & aerobic/resistive exercise along with prescribed medications to achieve LDL 70mg , HDL >40mg .    Expected Outcomes  Short Term: Participant states understanding of desired cholesterol values and is compliant with medications prescribed. Participant is following exercise prescription and nutrition guidelines.;Long Term: Cholesterol controlled with medications as prescribed, with individualized  exercise RX and with personalized nutrition plan. Value goals: LDL < 70mg , HDL > 40 mg.       Core Components/Risk Factors/Patient Goals Review:  Goals and Risk Factor Review    Row Name 09/26/18 1220 10/24/18 1519           Core Components/Risk Factors/Patient Goals Review   Personal Goals Review  Weight Management/Obesity;Hypertension;Lipids  Weight Management/Obesity;Hypertension;Lipids      Review  Pt with multiple CAD RFs willing to participate in CR exercise.  [name] is tolerating exercise well. [name] would like to get back to walking more.   Pt with multiple CAD RFs willing to participate in CR exercise.  [name] is tolerating exercise well.  VSS.  She feels that she is increasing her strength.       Expected Outcomes  Pt will continue to participate in CR  exercise, nutrition, and lifestyle modification opportunities.   Pt will continue to participate in CR exercise, nutrition, and lifestyle modification opportunities.          Core Components/Risk Factors/Patient Goals at Discharge (Final Review):  Goals and Risk Factor Review - 10/24/18 1519      Core Components/Risk Factors/Patient Goals Review   Personal Goals Review  Weight Management/Obesity;Hypertension;Lipids    Review  Pt with multiple CAD RFs willing to participate in CR exercise.  [name] is tolerating exercise well.  VSS.  She feels that she is increasing her strength.     Expected Outcomes  Pt will continue to participate in CR exercise, nutrition, and lifestyle modification opportunities.        ITP Comments: ITP Comments    Row Name 09/05/18 1406 09/26/18 1214 10/24/18 1518       ITP Comments  Dr. Armanda Magic, Medical Director  30 Day ITP Review. [name] is tolerating exercise well.  She has recently started exercise. VSS.  30 Day ITP Review. [name] is tolerating exercise well.  VSS.  She feels that she is increasing her strength.         Comments: See ITP Comments.

## 2018-10-25 ENCOUNTER — Encounter (HOSPITAL_COMMUNITY)
Admission: RE | Admit: 2018-10-25 | Discharge: 2018-10-25 | Disposition: A | Discharge status: Home / Self Care | Payer: Medicare HMO | Source: Ambulatory Visit | Attending: Internal Medicine | Admitting: Internal Medicine

## 2018-10-25 ENCOUNTER — Ambulatory Visit (HOSPITAL_COMMUNITY): Payer: Medicare HMO

## 2018-10-25 DIAGNOSIS — Z951 Presence of aortocoronary bypass graft: Secondary | ICD-10-CM

## 2018-10-28 ENCOUNTER — Ambulatory Visit (HOSPITAL_COMMUNITY): Payer: Medicare HMO

## 2018-10-28 ENCOUNTER — Encounter (HOSPITAL_COMMUNITY)
Admission: RE | Admit: 2018-10-28 | Discharge: 2018-10-28 | Disposition: A | Discharge status: Home / Self Care | Payer: Medicare HMO | Source: Ambulatory Visit | Attending: Internal Medicine | Admitting: Internal Medicine

## 2018-10-28 DIAGNOSIS — Z951 Presence of aortocoronary bypass graft: Secondary | ICD-10-CM

## 2018-10-30 ENCOUNTER — Ambulatory Visit (HOSPITAL_COMMUNITY): Payer: Medicare HMO

## 2018-10-30 ENCOUNTER — Encounter (HOSPITAL_COMMUNITY)
Admission: RE | Admit: 2018-10-30 | Discharge: 2018-10-30 | Disposition: A | Discharge status: Home / Self Care | Payer: Medicare HMO | Source: Ambulatory Visit | Attending: Internal Medicine | Admitting: Internal Medicine

## 2018-10-30 ENCOUNTER — Other Ambulatory Visit: Payer: Self-pay

## 2018-10-30 DIAGNOSIS — Z951 Presence of aortocoronary bypass graft: Secondary | ICD-10-CM | POA: Diagnosis not present

## 2018-11-01 ENCOUNTER — Encounter (HOSPITAL_COMMUNITY)
Admission: RE | Admit: 2018-11-01 | Discharge: 2018-11-01 | Disposition: A | Discharge status: Home / Self Care | Payer: Medicare HMO | Source: Ambulatory Visit | Attending: Internal Medicine | Admitting: Internal Medicine

## 2018-11-01 ENCOUNTER — Ambulatory Visit (HOSPITAL_COMMUNITY): Payer: Medicare HMO

## 2018-11-01 ENCOUNTER — Other Ambulatory Visit: Payer: Self-pay

## 2018-11-01 DIAGNOSIS — Z951 Presence of aortocoronary bypass graft: Secondary | ICD-10-CM

## 2018-11-04 ENCOUNTER — Telehealth (HOSPITAL_COMMUNITY): Payer: Self-pay | Admitting: Family Medicine

## 2018-11-04 ENCOUNTER — Encounter (HOSPITAL_COMMUNITY): Payer: Medicare HMO

## 2018-11-04 ENCOUNTER — Ambulatory Visit (HOSPITAL_COMMUNITY): Payer: Medicare HMO

## 2018-11-06 ENCOUNTER — Ambulatory Visit (HOSPITAL_COMMUNITY): Payer: Medicare HMO

## 2018-11-06 ENCOUNTER — Encounter (HOSPITAL_COMMUNITY): Payer: Medicare HMO

## 2018-11-08 ENCOUNTER — Ambulatory Visit (HOSPITAL_COMMUNITY): Payer: Medicare HMO

## 2018-11-08 ENCOUNTER — Encounter (HOSPITAL_COMMUNITY): Payer: Medicare HMO

## 2018-11-11 ENCOUNTER — Encounter (HOSPITAL_COMMUNITY): Payer: Medicare HMO

## 2018-11-11 ENCOUNTER — Ambulatory Visit (HOSPITAL_COMMUNITY): Payer: Medicare HMO

## 2018-11-13 ENCOUNTER — Encounter (HOSPITAL_COMMUNITY): Payer: Medicare HMO

## 2018-11-13 ENCOUNTER — Ambulatory Visit (HOSPITAL_COMMUNITY): Payer: Medicare HMO

## 2018-11-14 ENCOUNTER — Telehealth (HOSPITAL_COMMUNITY): Payer: Self-pay | Admitting: *Deleted

## 2018-11-14 NOTE — Progress Notes (Signed)
Cardiac Individual Treatment Plan  Patient Details  Name: Jaime Chavez MRN: [id number] Date of Birth: 04/12/1941 Referring Provider:     CARDIAC REHAB PHASE II ORIENTATION from 09/05/2018 in [facility] CARDIAC REHAB  Referring Provider  Parke Poisson MD       Initial Encounter Date:    CARDIAC REHAB PHASE II ORIENTATION from 09/05/2018 in [facility] CARDIAC REHAB  Date  09/05/18      Visit Diagnosis: S/P CABG x 4  Patient's Home Medications on Admission:  Current Outpatient Medications:  .  acetaminophen (TYLENOL) 325 MG tablet, Take 650 mg by mouth every 6 (six) hours as needed., Disp: , Rfl:  .  aspirin EC 81 MG tablet, Take 1 tablet (81 mg total) by mouth daily., Disp: 60 tablet, Rfl: 2 .  calcium gluconate 500 MG tablet, Take 1 tablet by mouth 3 (three) times daily., Disp: , Rfl:  .  clopidogrel (PLAVIX) 75 MG tablet, Take 1 tablet (75 mg total) by mouth daily., Disp: 30 tablet, Rfl: 1 .  fluticasone (FLONASE) 50 MCG/ACT nasal spray, Place into both nostrils daily., Disp: , Rfl:  .  glucosamine-chondroitin 500-400 MG tablet, Take 1 tablet by mouth 3 (three) times daily., Disp: , Rfl:  .  isosorbide mononitrate (IMDUR) 30 MG 24 hr tablet, Take 1 tablet (30 mg total) by mouth daily., Disp: 90 tablet, Rfl: 3 .  metoprolol tartrate (LOPRESSOR) 25 MG tablet, Take 1 tablet (25 mg total) by mouth 2 (two) times daily., Disp: 180 tablet, Rfl: 3 .  rosuvastatin (CRESTOR) 20 MG tablet, Take 1 tablet (20 mg total) by mouth daily., Disp: 90 tablet, Rfl: 3 .  traMADol (ULTRAM) 50 MG tablet, Take by mouth every 6 (six) hours as needed., Disp: , Rfl:   Past Medical History: Past Medical History:  Diagnosis Date  . Acid reflux   . Coronary artery disease involving native coronary artery of native heart with angina pectoris (HCC) 06/13/2018  . Dyspnea   . Environmental allergies   . Hyperlipidemia 2014  . Left patella fracture 2005  . S/P CABG x 4  06/14/2018   LIMA to LAD, SVG to OM, Sequential SVG to RCA and PDA, EVH from bilateral thighs and RLL    Tobacco Use: Social History   Tobacco Use  Smoking Status Never Smoker  Smokeless Tobacco Never Used    Labs: Recent Review Flowsheet Data    Labs for ITP Cardiac and Pulmonary Rehab Latest Ref Rng & Units 06/14/2018 06/14/2018 06/14/2018 06/15/2018 10/17/2018   Cholestrol 100 - 199 mg/dL - - - - 409   LDLCALC 0 - 99 mg/dL - - - - 59   HDL >81 mg/dL - - - - 60   Trlycerides 0 - 149 mg/dL - - - - 191(Y)   Hemoglobin A1c 4.8 - 5.6 % - - - - -   PHART 7.350 - 7.450 7.331(L) - 7.333(L) 7.368 -   PCO2ART 32.0 - 48.0 mmHg 37.9 - 35.9 35.8 -   HCO3 20.0 - 28.0 mmol/L 20.0 - 19.0(L) 20.1 -   TCO2 22 - 32 mmol/L 21(L) 20(L) 20(L) 24 -   ACIDBASEDEF 0.0 - 2.0 mmol/L 5.0(H) - 6.0(H) 4.3(H) -   O2SAT % 99.0 - 99.0 97.9 -      Capillary Blood Glucose: Lab Results  Component Value Date   GLUCAP 149 (H) 06/17/2018   GLUCAP 137 (H) 06/17/2018   GLUCAP 134 (H) 06/16/2018   GLUCAP 138 (H) 06/16/2018  GLUCAP 139 (H) 06/16/2018     Exercise Target Goals: Exercise Program Goal: Individual exercise prescription set using results from initial 6 min walk test and THRR while considering  patient's activity barriers and safety.   Exercise Prescription Goal: Initial exercise prescription builds to 30-45 minutes a day of aerobic activity, 2-3 days per week.  Home exercise guidelines will be given to patient during program as part of exercise prescription that the participant will acknowledge.  Activity Barriers & Risk Stratification: Activity Barriers & Cardiac Risk Stratification - 09/05/18 1503      Activity Barriers & Cardiac Risk Stratification   Activity Barriers  Deconditioning;Incisional Pain;Muscular Weakness    Cardiac Risk Stratification  High       6 Minute Walk: 6 Minute Walk    Row Name 09/05/18 1519         6 Minute Walk   Phase  Initial     Distance  1210 feet      Walk Time  6 minutes     # of Rest Breaks  1     MPH  2.29     METS  2.24     RPE  11     Perceived Dyspnea   0     VO2 Peak  7.84     Symptoms  Yes (comment)     Comments  Pt stopped for rest break at 5 minutes and 51 seconds. Pt rested for 8 seconds. Started at 5.59.      Resting HR  66 bpm     Resting BP  118/70     Resting Oxygen Saturation   98 %     Exercise Oxygen Saturation  during 6 min walk  98 %     Max Ex. HR  98 bpm     Max Ex. BP  122/72     2 Minute Post BP  122/76        Oxygen Initial Assessment:   Oxygen Re-Evaluation:   Oxygen Discharge (Final Oxygen Re-Evaluation):   Initial Exercise Prescription: Initial Exercise Prescription - 09/05/18 1500      Date of Initial Exercise RX and Referring Provider   Date  09/05/18    Referring Provider  Weston Brass A MD     Expected Discharge Date  12/13/18      Recumbant Bike   Level  1    Watts  10    Minutes  10    METs  1.8      NuStep   Level  2    SPM  75    Minutes  10    METs  2      Track   Laps  8    Minutes  10    METs  2.38      Prescription Details   Frequency (times per week)  3x    Duration  Progress to 30 minutes of continuous aerobic without signs/symptoms of physical distress      Intensity   THRR 40-80% of Max Heartrate  58-116    Ratings of Perceived Exertion  11-13    Perceived Dyspnea  0-4      Progression   Progression  Continue progressive overload as per policy without signs/symptoms or physical distress.      Resistance Training   Training Prescription  Yes    Weight  1lbs    Reps  10-15       Perform Capillary Blood Glucose checks as needed.  Exercise Prescription Changes: Exercise Prescription Changes    Row Name 09/11/18 1200 09/20/18 1036 09/30/18 1449 10/11/18 1450 10/21/18 1526     Response to Exercise   Blood Pressure (Admit)  108/82  114/76  134/80  118/70  132/86   Blood Pressure (Exercise)  132/80  132/64  112/78  130/70  138/70   Blood  Pressure (Exit)  114/70  104/68  110/70  102/68  100/60   Heart Rate (Admit)  78 bpm  80 bpm  79 bpm  72 bpm  79 bpm   Heart Rate (Exercise)  101 bpm  96 bpm  97 bpm  94 bpm  100 bpm   Heart Rate (Exit)  78 bpm  79 bpm  77 bpm  72 bpm  79 bpm   Rating of Perceived Exertion (Exercise)  13  12  12  12  12    Perceived Dyspnea (Exercise)  0  0  0  0  0   Symptoms  None  None  None  None  None   Comments  Pt oriented to exercise equipment  None  None  None  None   Duration  Progress to 30 minutes of  aerobic without signs/symptoms of physical distress  Progress to 30 minutes of  aerobic without signs/symptoms of physical distress  Progress to 30 minutes of  aerobic without signs/symptoms of physical distress  Progress to 30 minutes of  aerobic without signs/symptoms of physical distress  Progress to 30 minutes of  aerobic without signs/symptoms of physical distress   Intensity  THRR unchanged  THRR unchanged  THRR unchanged  THRR unchanged  THRR unchanged     Progression   Progression  Continue to progress workloads to maintain intensity without signs/symptoms of physical distress.  Continue to progress workloads to maintain intensity without signs/symptoms of physical distress.  Continue to progress workloads to maintain intensity without signs/symptoms of physical distress.  Continue to progress workloads to maintain intensity without signs/symptoms of physical distress.  Continue to progress workloads to maintain intensity without signs/symptoms of physical distress.   Average METs  2.31  2.4  2.4  2.4  2.7     Resistance Training   Training Prescription  No  Yes  Yes  Yes  Yes   Weight  -  1lbs  2lbs  2lbs  2lbs   Reps  -  10-15  10-15  10-15  10-15   Time  -  10 Minutes  10 Minutes  10 Minutes  10 Minutes     Recumbant Bike   Level  1  1  1  1  1    Watts  10  10  10  10  12    Minutes  10  10  10  10  10    METs  2.2  2  2.2  2  2.4     NuStep   Level  2  2  2  2  2    SPM  75  85  85  85   85   Minutes  10  10  10  10  10    METs  2.2  2.3  2.2  2.4  2.4     Track   Laps  9  11  9  11  10    Minutes  10  10  10  10  10    METs  2.45  2.91  2.53  2.91  2.76     Home Exercise Plan  Plans to continue exercise at  -  Home (comment) Walking & Recumbent Bike   Home (comment) Walking & Recumbent Bike   Home (comment) Walking & Recumbent Bike   Home (comment) Walking & Recumbent Bike    Frequency  -  Add 2 additional days to program exercise sessions.  Add 2 additional days to program exercise sessions.  Add 2 additional days to program exercise sessions.  Add 2 additional days to program exercise sessions.   Initial Home Exercises Provided  -  09/20/18  09/20/18  09/20/18  09/20/18   Row Name 11/01/18 1527             Response to Exercise   Blood Pressure (Admit)  122/72       Blood Pressure (Exercise)  130/64       Blood Pressure (Exit)  112/74       Heart Rate (Admit)  85 bpm       Heart Rate (Exercise)  95 bpm       Heart Rate (Exit)  81 bpm       Rating of Perceived Exertion (Exercise)  12       Perceived Dyspnea (Exercise)  0       Symptoms  None       Comments  None       Duration  Progress to 30 minutes of  aerobic without signs/symptoms of physical distress       Intensity  THRR unchanged         Progression   Progression  Continue to progress workloads to maintain intensity without signs/symptoms of physical distress.       Average METs  2.64         Resistance Training   Training Prescription  Yes       Weight  2lbs       Reps  10-15       Time  10 Minutes         Recumbant Bike   Level  1       Watts  12       Minutes  10       METs  2.6         NuStep   Level  2       SPM  85       Minutes  10       METs  2.4         Track   Laps  10       Minutes  10       METs  2.76         Home Exercise Plan   Plans to continue exercise at  Home (comment) Walking & Recumbent Bike        Frequency  Add 2 additional days to program exercise sessions.        Initial Home Exercises Provided  09/20/18          Exercise Comments: Exercise Comments    Row Name 09/11/18 1240 09/23/18 1648 10/24/18 1501 11/08/18 1529     Exercise Comments  Pt's first day of exercise. Pt oriented to exercise equipment and responded well to workloads. Will continue to monitor and progress as tolerated.   Reviewed HEP with pt. Pt verbalized understanding. Will continue to monitor and progress pt as tolerated.   Reviewed METs and Goals with pt. Pt is continuing to respond well to exercise prescription. Will continue to monitor and progress  pt as tolerated.   Pt has been responding well to exercise prescription. Exercise currently on hold due to departmental closure per CDC recommendation to help prevent COVID-19 spread.        Exercise Goals and Review: Exercise Goals    Row Name 09/05/18 1407             Exercise Goals   Increase Physical Activity  Yes       Intervention  Provide advice, education, support and counseling about physical activity/exercise needs.;Develop an individualized exercise prescription for aerobic and resistive training based on initial evaluation findings, risk stratification, comorbidities and participant's personal goals.       Expected Outcomes  Short Term: Attend rehab on a regular basis to increase amount of physical activity.;Long Term: Exercising regularly at least 3-5 days a week.;Long Term: Add in home exercise to make exercise part of routine and to increase amount of physical activity.       Increase Strength and Stamina  Yes       Intervention  Provide advice, education, support and counseling about physical activity/exercise needs.;Develop an individualized exercise prescription for aerobic and resistive training based on initial evaluation findings, risk stratification, comorbidities and participant's personal goals.       Expected Outcomes  Short Term: Perform resistance training exercises routinely during rehab and add in  resistance training at home;Long Term: Improve cardiorespiratory fitness, muscular endurance and strength as measured by increased METs and functional capacity ( );Short Term: Increase workloads from initial exercise prescription for resistance, speed, and METs.       Able to understand and use rate of perceived exertion (RPE) scale  Yes       Intervention  Provide education and explanation on how to use RPE scale       Expected Outcomes  Short Term: Able to use RPE daily in rehab to express subjective intensity level;Long Term:  Able to use RPE to guide intensity level when exercising independently       Knowledge and understanding of Target Heart Rate Range (THRR)  Yes       Intervention  Provide education and explanation of THRR including how the numbers were predicted and where they are located for reference       Expected Outcomes  Short Term: Able to state/look up THRR;Long Term: Able to use THRR to govern intensity when exercising independently;Short Term: Able to use daily as guideline for intensity in rehab       Able to check pulse independently  Yes       Intervention  Provide education and demonstration on how to check pulse in carotid and radial arteries.;Review the importance of being able to check your own pulse for safety during independent exercise       Expected Outcomes  Short Term: Able to explain why pulse checking is important during independent exercise;Long Term: Able to check pulse independently and accurately       Understanding of Exercise Prescription  Yes       Intervention  Provide education, explanation, and written materials on patient's individual exercise prescription       Expected Outcomes  Short Term: Able to explain program exercise prescription;Long Term: Able to explain home exercise prescription to exercise independently          Exercise Goals Re-Evaluation : Exercise Goals Re-Evaluation    Row Name 09/23/18 1648 10/24/18 1503           Exercise  Goal Re-Evaluation   Exercise  Goals Review  Increase Physical Activity;Able to understand and use rate of perceived exertion (RPE) scale;Knowledge and understanding of Target Heart Rate Range (THRR);Understanding of Exercise Prescription;Increase Strength and Stamina;Able to check pulse independently  Increase Physical Activity;Understanding of Exercise Prescription      Comments  Reviewed HEP with pt. Also reviewed THRR, RPE Scale, weather conditions, endpoints of exericse, NTG use/when to call 911, warmup and cool down.   Pt is responding well to exercise prescription. Pt is enjoying program and is working towards her goals of increasing her strength and stamina.        Expected Outcomes  Pt will plan to walk and use home exercise bike 2-3 days a week for 30-45 minutes. Pt will continue to increase cardiovascular strength. Will continue to monitor and progress pt.   Pt is not exercising consistenly at home. Encouraged pt to walk or use home exercise bike 2-3 days a week, for 30 minutes. Pt stated she will work towards that. Will continue to monitor and progress pt.          Discharge Exercise Prescription (Final Exercise Prescription Changes): Exercise Prescription Changes - 11/01/18 1527      Response to Exercise   Blood Pressure (Admit)  122/72    Blood Pressure (Exercise)  130/64    Blood Pressure (Exit)  112/74    Heart Rate (Admit)  85 bpm    Heart Rate (Exercise)  95 bpm    Heart Rate (Exit)  81 bpm    Rating of Perceived Exertion (Exercise)  12    Perceived Dyspnea (Exercise)  0    Symptoms  None    Comments  None    Duration  Progress to 30 minutes of  aerobic without signs/symptoms of physical distress    Intensity  THRR unchanged      Progression   Progression  Continue to progress workloads to maintain intensity without signs/symptoms of physical distress.    Average METs  2.64      Resistance Training   Training Prescription  Yes    Weight  2lbs    Reps  10-15    Time   10 Minutes      Recumbant Bike   Level  1    Watts  12    Minutes  10    METs  2.6      NuStep   Level  2    SPM  85    Minutes  10    METs  2.4      Track   Laps  10    Minutes  10    METs  2.76      Home Exercise Plan   Plans to continue exercise at  Home (comment)   Walking & Recumbent Bike    Frequency  Add 2 additional days to program exercise sessions.    Initial Home Exercises Provided  09/20/18       Nutrition:  Target Goals: Understanding of nutrition guidelines, daily intake of sodium 1500mg , cholesterol 200mg , calories 30% from fat and 7% or less from saturated fats, daily to have 5 or more servings of fruits and vegetables.  Biometrics: Pre Biometrics - 09/05/18 1502      Pre Biometrics   Height  5\' 2"  (1.575 m)    Weight  70.7 kg    Waist Circumference  40 inches    Hip Circumference  43 inches    Waist to Hip Ratio  0.93 %  BMI (Calculated)  28.5    Triceps Skinfold  32 mm    % Body Fat  42.8 %    Grip Strength  23.5 kg    Flexibility  12 in    Single Leg Stand  11 seconds        Nutrition Therapy Plan and Nutrition Goals: Nutrition Therapy & Goals - 09/05/18 1605      Nutrition Therapy   Diet  heart healthy, carb modified      Personal Nutrition Goals   Nutrition Goal  Pt to identify and limit food sources of saturated fat, trans fat, refined carbohydrates and sodium    Personal Goal #2  Pt to identify food quantities necessary to achieve weight loss of 6-24 lbs. at graduation from cardiac rehab.     Personal Goal #3  Pt able to name foods that affect blood glucose.      Intervention Plan   Intervention  Prescribe, educate and counsel regarding individualized specific dietary modifications aiming towards targeted core components such as weight, hypertension, lipid management, diabetes, heart failure and other comorbidities.    Expected Outcomes  Short Term Goal: Understand basic principles of dietary content, such as calories, fat,  sodium, cholesterol and nutrients.;Long Term Goal: Adherence to prescribed nutrition plan.       Nutrition Assessments: Nutrition Assessments - 09/05/18 1604      MEDFICTS Scores   Pre Score  46       Nutrition Goals Re-Evaluation: Nutrition Goals Re-Evaluation    Row Name 09/05/18 1600             Goals   Current Weight  155 lb 13.8 oz (70.7 kg)          Nutrition Goals Re-Evaluation: Nutrition Goals Re-Evaluation    Row Name 09/05/18 1600             Goals   Current Weight  155 lb 13.8 oz (70.7 kg)          Nutrition Goals Discharge (Final Nutrition Goals Re-Evaluation): Nutrition Goals Re-Evaluation - 09/05/18 1600      Goals   Current Weight  155 lb 13.8 oz (70.7 kg)       Psychosocial: Target Goals: Acknowledge presence or absence of significant depression and/or stress, maximize coping skills, provide positive support system. Participant is able to verbalize types and ability to use techniques and skills needed for reducing stress and depression.  Initial Review & Psychosocial Screening: Initial Psych Review & Screening - 09/05/18 1539      Initial Review   Current issues with  None Identified      Family Dynamics   Good Support System?  Yes   Pt lists her spouse and son as sources of support.  Her husband was present at today's orientation session.      Barriers   Psychosocial barriers to participate in program  There are no identifiable barriers or psychosocial needs.      Screening Interventions   Interventions  Encouraged to exercise       Quality of Life Scores: Quality of Life - 09/05/18 1524      Quality of Life   Select  Quality of Life      Quality of Life Scores   Health/Function Pre  20.77 %    Socioeconomic Pre  26.36 %    Psych/Spiritual Pre  27.86 %    Family Pre  27.6 %    GLOBAL Pre  24.38 %  Scores of 19 and below usually indicate a poorer quality of life in these areas.  A difference of  2-3 points is a  clinically meaningful difference.  A difference of 2-3 points in the total score of the Quality of Life Index has been associated with significant improvement in overall quality of life, self-image, physical symptoms, and general health in studies assessing change in quality of life.  PHQ-9: Recent Review Flowsheet Data    Depression screen [facility] 2/9 05/09/2018 02/25/2018   Decreased Interest 0 0   Down, Depressed, Hopeless 0 0   PHQ - 2 Score 0 0     Interpretation of Total Score  Total Score Depression Severity:  1-4 = Minimal depression, 5-9 = Mild depression, 10-14 = Moderate depression, 15-19 = Moderately severe depression, 20-27 = Severe depression   Psychosocial Evaluation and Intervention: Psychosocial Evaluation - 09/26/18 1215      Psychosocial Evaluation & Interventions   Interventions  Encouraged to exercise with the program and follow exercise prescription    Comments  No psychosocial interventions.    Expected Outcomes  [name] will maintain a positive outlook with good coping skills.    Continue Psychosocial Services   No Follow up required       Psychosocial Re-Evaluation: Psychosocial Re-Evaluation    Row Name 09/26/18 1215 10/24/18 1518 11/07/18 1607         Psychosocial Re-Evaluation   Current issues with  None Identified  None Identified  None Identified     Comments  No psychosocial interventions necessary.  No psychosocial interventions necessary.  No psychosocial interventions necessary.     Expected Outcomes  [name] will maintain a positive outlook with good coping skills.  [name] will maintain a positive outlook with good coping skills.  [name] will maintain a positive outlook with good coping skills.     Interventions  Encouraged to attend Cardiac Rehabilitation for the exercise  Encouraged to attend Cardiac Rehabilitation for the exercise  Encouraged to attend Cardiac Rehabilitation for the exercise     Continue Psychosocial Services   No Follow up  required  No Follow up required  No Follow up required        Psychosocial Discharge (Final Psychosocial Re-Evaluation): Psychosocial Re-Evaluation - 11/07/18 1607      Psychosocial Re-Evaluation   Current issues with  None Identified    Comments  No psychosocial interventions necessary.    Expected Outcomes  [name] will maintain a positive outlook with good coping skills.    Interventions  Encouraged to attend Cardiac Rehabilitation for the exercise    Continue Psychosocial Services   No Follow up required       Vocational Rehabilitation: Provide vocational rehab assistance to qualifying candidates.   Vocational Rehab Evaluation & Intervention: Vocational Rehab - 09/05/18 1540      Initial Vocational Rehab Evaluation & Intervention   Assessment shows need for Vocational Rehabilitation  No       Education: Education Goals: Education classes will be provided on a weekly basis, covering required topics. Participant will state understanding/return demonstration of topics presented.  Learning Barriers/Preferences: Learning Barriers/Preferences - 09/05/18 1504      Learning Barriers/Preferences   Learning Barriers  None    Learning Preferences  Verbal Instruction;Skilled Demonstration;Written Material       Education Topics: Count Your Pulse:  -Group instruction provided by verbal instruction, demonstration, patient participation and written materials to support subject.  Instructors address importance of being able to find your pulse and how  to count your pulse when at home without a heart monitor.  Patients get hands on experience counting their pulse with staff help and individually.   CARDIAC REHAB PHASE II EXERCISE from 09/13/2018 in [facility] CARDIAC REHAB  Date  09/13/18  Educator  RN  Instruction Review Code  2- Demonstrated Understanding      Heart Attack, Angina, and Risk Factor Modification:  -Group instruction provided by verbal instruction,  video, and written materials to support subject.  Instructors address signs and symptoms of angina and heart attacks.    Also discuss risk factors for heart disease and how to make changes to improve heart health risk factors.   Functional Fitness:  -Group instruction provided by verbal instruction, demonstration, patient participation, and written materials to support subject.  Instructors address safety measures for doing things around the house.  Discuss how to get up and down off the floor, how to pick things up properly, how to safely get out of a chair without assistance, and balance training.   Meditation and Mindfulness:  -Group instruction provided by verbal instruction, patient participation, and written materials to support subject.  Instructor addresses importance of mindfulness and meditation practice to help reduce stress and improve awareness.  Instructor also leads participants through a meditation exercise.    CARDIAC REHAB PHASE II EXERCISE from 11/01/2018 in [facility] CARDIAC REHAB  Date  10/23/18  Educator  RN  Instruction Review Code  2- Demonstrated Understanding      Stretching for Flexibility and Mobility:  -Group instruction provided by verbal instruction, patient participation, and written materials to support subject.  Instructors lead participants through series of stretches that are designed to increase flexibility thus improving mobility.  These stretches are additional exercise for major muscle groups that are typically performed during regular warm up and cool down.   Hands Only CPR:  -Group verbal, video, and participation provides a basic overview of AHA guidelines for community CPR. Role-play of emergencies allow participants the opportunity to practice calling for help and chest compression technique with discussion of AED use.   Hypertension: -Group verbal and written instruction that provides a basic overview of hypertension including  the most recent diagnostic guidelines, risk factor reduction with self-care instructions and medication management.   CARDIAC REHAB PHASE II EXERCISE from 11/01/2018 in [facility] CARDIAC REHAB  Date  11/01/18  Educator  RN  Instruction Review Code  2- Demonstrated Understanding       Nutrition I class: Heart Healthy Eating:  -Group instruction provided by PowerPoint slides, verbal discussion, and written materials to support subject matter. The instructor gives an explanation and review of the Therapeutic Lifestyle Changes diet recommendations, which includes a discussion on lipid goals, dietary fat, sodium, fiber, plant stanol/sterol esters, sugar, and the components of a well-balanced, healthy diet.   Nutrition II class: Lifestyle Skills:  -Group instruction provided by PowerPoint slides, verbal discussion, and written materials to support subject matter. The instructor gives an explanation and review of label reading, grocery shopping for heart health, heart healthy recipe modifications, and ways to make healthier choices when eating out.   Diabetes Question & Answer:  -Group instruction provided by PowerPoint slides, verbal discussion, and written materials to support subject matter. The instructor gives an explanation and review of diabetes co-morbidities, pre- and post-prandial blood glucose goals, pre-exercise blood glucose goals, signs, symptoms, and treatment of hypoglycemia and hyperglycemia, and foot care basics.   Diabetes Blitz:  -Group instruction  provided by PowerPoint slides, verbal discussion, and written materials to support subject matter. The instructor gives an explanation and review of the physiology behind type 1 and type 2 diabetes, diabetes medications and rational behind using different medications, pre- and post-prandial blood glucose recommendations and Hemoglobin A1c goals, diabetes diet, and exercise including blood glucose guidelines for  exercising safely.    Portion Distortion:  -Group instruction provided by PowerPoint slides, verbal discussion, written materials, and food models to support subject matter. The instructor gives an explanation of serving size versus portion size, changes in portions sizes over the last 20 years, and what consists of a serving from each food group.   Stress Management:  -Group instruction provided by verbal instruction, video, and written materials to support subject matter.  Instructors review role of stress in heart disease and how to cope with stress positively.     CARDIAC REHAB PHASE II EXERCISE from 11/01/2018 in [facility] CARDIAC REHAB  Date  09/18/18  Educator  RN  Instruction Review Code  2- Demonstrated Understanding      Exercising on Your Own:  -Group instruction provided by verbal instruction, power point, and written materials to support subject.  Instructors discuss benefits of exercise, components of exercise, frequency and intensity of exercise, and end points for exercise.  Also discuss use of nitroglycerin and activating EMS.  Review options of places to exercise outside of rehab.  Review guidelines for sex with heart disease.   CARDIAC REHAB PHASE II EXERCISE from 11/01/2018 in [facility] CARDIAC REHAB  Date  10/16/18  Educator  EP  Instruction Review Code  2- Demonstrated Understanding      Cardiac Drugs I:  -Group instruction provided by verbal instruction and written materials to support subject.  Instructor reviews cardiac drug classes: antiplatelets, anticoagulants, beta blockers, and statins.  Instructor discusses reasons, side effects, and lifestyle considerations for each drug class.   Cardiac Drugs II:  -Group instruction provided by verbal instruction and written materials to support subject.  Instructor reviews cardiac drug classes: angiotensin converting enzyme inhibitors (ACE-I), angiotensin II receptor blockers (ARBs),  nitrates, and calcium channel blockers.  Instructor discusses reasons, side effects, and lifestyle considerations for each drug class.   CARDIAC REHAB PHASE II EXERCISE from 11/01/2018 in [facility] CARDIAC REHAB  Date  10/02/18  Instruction Review Code  2- Demonstrated Understanding      Anatomy and Physiology of the Circulatory System:  Group verbal and written instruction and models provide basic cardiac anatomy and physiology, with the coronary electrical and arterial systems. Review of: AMI, Angina, Valve disease, Heart Failure, Peripheral Artery Disease, Cardiac Arrhythmia, Pacemakers, and the ICD.   CARDIAC REHAB PHASE II EXERCISE from 11/01/2018 in [facility] CARDIAC REHAB  Date  09/25/18  Educator  RN  Instruction Review Code  2- Demonstrated Understanding      Other Education:  -Group or individual verbal, written, or video instructions that support the educational goals of the cardiac rehab program.   Holiday Eating Survival Tips:  -Group instruction provided by PowerPoint slides, verbal discussion, and written materials to support subject matter. The instructor gives patients tips, tricks, and techniques to help them not only survive but enjoy the holidays despite the onslaught of food that accompanies the holidays.   Knowledge Questionnaire Score: Knowledge Questionnaire Score - 09/05/18 1407      Knowledge Questionnaire Score   Pre Score  20/24       Core  Components/Risk Factors/Patient Goals at Admission: Personal Goals and Risk Factors at Admission - 09/05/18 1503      Core Components/Risk Factors/Patient Goals on Admission    Weight Management  Yes;Weight Loss    Intervention  Weight Management: Develop a combined nutrition and exercise program designed to reach desired caloric intake, while maintaining appropriate intake of nutrient and fiber, sodium and fats, and appropriate energy expenditure required for the weight  goal.;Weight Management: Provide education and appropriate resources to help participant work on and attain dietary goals.;Weight Management/Obesity: Establish reasonable short term and long term weight goals.    Admit Weight  155 lb 13.8 oz (70.7 kg)    Goal Weight: Long Term  145 lb (65.8 kg)    Expected Outcomes  Short Term: Continue to assess and modify interventions until short term weight is achieved;Long Term: Adherence to nutrition and physical activity/exercise program aimed toward attainment of established weight goal;Weight Loss: Understanding of general recommendations for a balanced deficit meal plan, which promotes 1-2 lb weight loss per week and includes a negative energy balance of [id number] kcal/d;Understanding recommendations for meals to include 15-35% energy as protein, 25-35% energy from fat, 35-60% energy from carbohydrates, less than 200mg  of dietary cholesterol, 20-35 gm of total fiber daily;Understanding of distribution of calorie intake throughout the day with the consumption of 4-5 meals/snacks    Hypertension  Yes    Intervention  Provide education on lifestyle modifcations including regular physical activity/exercise, weight management, moderate sodium restriction and increased consumption of fresh fruit, vegetables, and low fat dairy, alcohol moderation, and smoking cessation.;Monitor prescription use compliance.    Expected Outcomes  Short Term: Continued assessment and intervention until BP is < 140/3090mm HG in hypertensive participants. < 130/3880mm HG in hypertensive participants with diabetes, heart failure or chronic kidney disease.;Long Term: Maintenance of blood pressure at goal levels.    Lipids  Yes    Intervention  Provide education and support for participant on nutrition & aerobic/resistive exercise along with prescribed medications to achieve LDL 70mg , HDL >40mg .    Expected Outcomes  Short Term: Participant states understanding of desired cholesterol values and is  compliant with medications prescribed. Participant is following exercise prescription and nutrition guidelines.;Long Term: Cholesterol controlled with medications as prescribed, with individualized exercise RX and with personalized nutrition plan. Value goals: LDL < 70mg , HDL > 40 mg.       Core Components/Risk Factors/Patient Goals Review:  Goals and Risk Factor Review    Row Name 09/26/18 1220 10/24/18 1519 11/07/18 1607         Core Components/Risk Factors/Patient Goals Review   Personal Goals Review  Weight Management/Obesity;Hypertension;Lipids  Weight Management/Obesity;Hypertension;Lipids  Weight Management/Obesity;Hypertension;Lipids     Review  Pt with multiple CAD RFs willing to participate in CR exercise.  Jaime Chavez is tolerating exercise well. Jaime Chavez would like to get back to walking more.   Pt with multiple CAD RFs willing to participate in CR exercise.  Jaime Chavez is tolerating exercise well.  VSS.  She feels that she is increasing her strength.   Pt continues to tolerate exercise well.  Exercise currently on hold as department is closed per recommended guidelines from federal government to prevent spread of COVID-19.      Expected Outcomes  Pt will continue to participate in CR exercise, nutrition, and lifestyle modification opportunities.   Pt will continue to participate in CR exercise, nutrition, and lifestyle modification opportunities.   Pt will continue to participate in CR exercise, nutrition, and lifestyle modification opportunities.  Core Components/Risk Factors/Patient Goals at Discharge (Final Review):  Goals and Risk Factor Review - 11/07/18 1607      Core Components/Risk Factors/Patient Goals Review   Personal Goals Review  Weight Management/Obesity;Hypertension;Lipids    Review  Pt continues to tolerate exercise well.  Exercise currently on hold as department is closed per recommended guidelines from federal government to prevent spread of COVID-19.     Expected  Outcomes  Pt will continue to participate in CR exercise, nutrition, and lifestyle modification opportunities.        ITP Comments: ITP Comments    Row Name 09/05/18 1406 09/26/18 1214 10/24/18 1518 11/07/18 1607     ITP Comments  Dr. Armanda Magic, Medical Director  30 Day ITP Review. [name] is tolerating exercise well.  She has recently started exercise. VSS.  30 Day ITP Review. [name] is tolerating exercise well.  VSS.  She feels that she is increasing her strength.   30 Day ITP Review.Pt continues to tolerate exercise well.  Exercise currently on hold as department is closed per recommended guidelines from federal government to prevent spread of COVID-19.        Comments: See ITP Comments.

## 2018-11-14 NOTE — Telephone Encounter (Signed)
Pt called and updated about extended closure of cardiac rehab for four weeks d/t CoVID-19.  Tentative reopen date of 12/09/2018.  Pt verbalized understanding and grateful of call.

## 2018-11-15 ENCOUNTER — Encounter (HOSPITAL_COMMUNITY): Payer: Medicare HMO

## 2018-11-15 ENCOUNTER — Ambulatory Visit (HOSPITAL_COMMUNITY): Payer: Medicare HMO

## 2018-11-18 ENCOUNTER — Encounter (HOSPITAL_COMMUNITY): Payer: Medicare HMO

## 2018-11-18 ENCOUNTER — Ambulatory Visit (HOSPITAL_COMMUNITY): Payer: Medicare HMO

## 2018-11-20 ENCOUNTER — Encounter (HOSPITAL_COMMUNITY): Payer: Medicare HMO

## 2018-11-20 ENCOUNTER — Ambulatory Visit (HOSPITAL_COMMUNITY): Payer: Medicare HMO

## 2018-11-22 ENCOUNTER — Ambulatory Visit (HOSPITAL_COMMUNITY): Payer: Medicare HMO

## 2018-11-22 ENCOUNTER — Encounter (HOSPITAL_COMMUNITY): Payer: Medicare HMO

## 2018-11-25 ENCOUNTER — Ambulatory Visit (HOSPITAL_COMMUNITY): Payer: Medicare HMO

## 2018-11-25 ENCOUNTER — Encounter (HOSPITAL_COMMUNITY): Payer: Medicare HMO

## 2018-11-27 ENCOUNTER — Ambulatory Visit (HOSPITAL_COMMUNITY): Payer: Medicare HMO

## 2018-11-27 ENCOUNTER — Telehealth (HOSPITAL_COMMUNITY): Payer: Self-pay | Admitting: *Deleted

## 2018-11-27 ENCOUNTER — Encounter (HOSPITAL_COMMUNITY): Payer: Medicare HMO

## 2018-11-27 NOTE — Telephone Encounter (Signed)
Called to notify patient that the cardiac and pulmonary rehabilitation department will be closed temporarily due to COVID-19 restrictions. Pt verbalized understanding.  Patient is walking at home 3 days/week.  Artist Pais, MS, ACSM CEP 11/27/2018 [phone]

## 2018-11-29 ENCOUNTER — Encounter (HOSPITAL_COMMUNITY): Payer: Medicare HMO

## 2018-11-29 ENCOUNTER — Ambulatory Visit (HOSPITAL_COMMUNITY): Payer: Medicare HMO

## 2018-12-02 ENCOUNTER — Encounter (HOSPITAL_COMMUNITY): Payer: Medicare HMO

## 2018-12-02 ENCOUNTER — Ambulatory Visit (HOSPITAL_COMMUNITY): Payer: Medicare HMO

## 2018-12-04 ENCOUNTER — Ambulatory Visit (HOSPITAL_COMMUNITY): Payer: Medicare HMO

## 2018-12-04 ENCOUNTER — Encounter (HOSPITAL_COMMUNITY): Payer: Medicare HMO

## 2018-12-06 ENCOUNTER — Encounter (HOSPITAL_COMMUNITY): Payer: Medicare HMO

## 2018-12-06 ENCOUNTER — Ambulatory Visit (HOSPITAL_COMMUNITY): Payer: Medicare HMO

## 2018-12-09 ENCOUNTER — Encounter (HOSPITAL_COMMUNITY): Payer: Medicare HMO

## 2018-12-09 ENCOUNTER — Ambulatory Visit (HOSPITAL_COMMUNITY): Payer: Medicare HMO

## 2018-12-11 ENCOUNTER — Other Ambulatory Visit: Payer: Self-pay | Admitting: Internal Medicine

## 2018-12-11 ENCOUNTER — Encounter (HOSPITAL_COMMUNITY): Payer: Medicare HMO

## 2018-12-11 ENCOUNTER — Ambulatory Visit (HOSPITAL_COMMUNITY): Payer: Medicare HMO

## 2019-01-16 ENCOUNTER — Telehealth (HOSPITAL_COMMUNITY): Payer: Self-pay

## 2019-01-16 ENCOUNTER — Encounter: Payer: Self-pay | Admitting: Internal Medicine

## 2019-01-16 ENCOUNTER — Telehealth (INDEPENDENT_AMBULATORY_CARE_PROVIDER_SITE_OTHER): Payer: Medicare HMO | Admitting: Internal Medicine

## 2019-01-16 VITALS — BP 120/80 | HR 71 | Ht 62.5 in | Wt 158.0 lb

## 2019-01-16 DIAGNOSIS — I2510 Atherosclerotic heart disease of native coronary artery without angina pectoris: Secondary | ICD-10-CM

## 2019-01-16 DIAGNOSIS — Z951 Presence of aortocoronary bypass graft: Secondary | ICD-10-CM

## 2019-01-16 DIAGNOSIS — E781 Pure hyperglyceridemia: Secondary | ICD-10-CM

## 2019-01-16 NOTE — Telephone Encounter (Signed)
Phone call made to Pt to provide information about virtual cardiac rehab. Pt was responsive and wanted to move forward with virtual rehab. Pt was informed she would receive a text message with instructions on how to set up the application.

## 2019-01-16 NOTE — Progress Notes (Signed)
Virtual Visit via Video Note   This visit type was conducted due to national recommendations for restrictions regarding the COVID-19 Pandemic (e.g. social distancing) in an effort to limit this patient's exposure and mitigate transmission in our community.  Due to her co-morbid illnesses, this patient is at least at moderate risk for complications without adequate follow up.  This format is felt to be most appropriate for this patient at this time.  All issues noted in this document were discussed and addressed.  A limited physical exam was performed with this format.  Please refer to the patient's chart for her consent to telehealth for [facility].   Date:  01/16/2019   ID:  Jaime BetterAlberta Allston, DOB Sep 10, 1940, MRN [id number]  Patient Location: Home Provider Location: Home  PCP:  Leland HerYoo, Elsia J, DO  Cardiologist:  Parke PoissonGayatri A Symphanie Cederberg, MD  Electrophysiologist:  None   Evaluation Performed:  Follow-Up Visit  Chief Complaint:  F/u CABG in October 2019  History of Present Illness:    Jaime Betterlberta Chavez is a 78 y.o. female with a hx of unstable angina s/p CABG x 4 June 14, 2018. She has a history of GERD, HLD.   She is feeling well and tolerating medical therapy well. She understandably is less active during the COVID-19 pandemic, and feels she has less stamina, but is not particularly troubled by this.  Tolerating Imdur and metoprolol well, no further chest pain or pressure, no chest ache.   The patient denies chest pain, chest pressure, dyspnea at rest or with exertion, palpitations, PND, orthopnea, or leg swelling. Denies syncope or presyncope. Denies dizziness or lightheadedness.  The patient does not have symptoms concerning for COVID-19 infection (fever, chills, cough, or new shortness of breath).    Past Medical History:  Diagnosis Date  . Acid reflux   . Coronary artery disease involving native coronary artery of native heart with angina pectoris (HCC) 06/13/2018  . Dyspnea   .  Environmental allergies   . Hyperlipidemia 2014  . Left patella fracture 2005  . 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 CARDIAC CATHETERIZATION  06/13/2018  . CORONARY ARTERY BYPASS GRAFT N/A 06/14/2018   Procedure: CORONARY ARTERY BYPASS GRAFTING (CABG) times 4 using left internal mammary artery and right saphenous vein using endoscope.;  Surgeon: Purcell Nailswen, Clarence H, MD;  Location: [facility] OR;  Service: Open Heart Surgery;  Laterality: N/A;  . DILATION AND CURETTAGE, DIAGNOSTIC / THERAPEUTIC     for nonmalignant polyp  . RIGHT/LEFT HEART CATH AND CORONARY ANGIOGRAPHY N/A 06/13/2018   Procedure: RIGHT/LEFT HEART CATH AND CORONARY ANGIOGRAPHY;  Surgeon: Yvonne KendallEnd, Christopher, MD;  Location: [facility] INVASIVE CV LAB;  Service: Cardiovascular;  Laterality: N/A;  . TEE WITHOUT CARDIOVERSION N/A 06/14/2018   Procedure: TRANSESOPHAGEAL ECHOCARDIOGRAM (TEE);  Surgeon: Purcell Nailswen, Clarence H, MD;  [facility];  Service: Open Heart Surgery;  Laterality: N/A;     Current Meds  Medication Sig  . acetaminophen (TYLENOL) 325 MG tablet Take 650 mg by mouth every 6 (six) hours as needed.  Marland Kitchen. aspirin EC 81 MG tablet Take 1 tablet (81 mg total) by mouth daily.  . calcium gluconate 500 MG tablet Take 1 tablet by mouth 3 (three) times daily.  . fluticasone (FLONASE) 50 MCG/ACT nasal spray Place into both nostrils daily.  Marland Kitchen. glucosamine-chondroitin 500-400 MG tablet Take 1  tablet by mouth 3 (three) times daily.  . isosorbide mononitrate (IMDUR) 30 MG 24 hr tablet Take 1 tablet (30 mg total) by mouth daily.  . metoprolol tartrate (LOPRESSOR) 25 MG tablet Take 1 tablet (25 mg total) by mouth 2 (two) times daily.  . rosuvastatin (CRESTOR) 20 MG tablet Take 1 tablet (20 mg total) by mouth daily.  . traMADol (ULTRAM) 50 MG tablet Take by mouth every 6 (six) hours as needed.     Allergies:    Osteoporosis support [a-g pro] and Alendronate sodium   Social History   Tobacco Use  . Smoking status: Never Smoker  . Smokeless tobacco: Never Used  Substance Use Topics  . Alcohol use: Yes    Comment: wine and liquor, about 1 drink per day  . Drug use: Never     Family Hx: The patient's family history includes Breast cancer in her paternal aunt; Colon cancer in her sister; Heart attack in her father; Other in her father; Pancreatic cancer in her mother.  ROS:   Please see the history of present illness.     All other systems reviewed and are negative.   Prior CV studies:   The following studies were reviewed today:  No new  Labs/Other Tests and Data Reviewed:    EKG:  No ECG reviewed.  Recent Labs: 02/25/2018: TSH 4.270 06/13/2018: ALT 33 06/15/2018: Magnesium 2.4 06/18/2018: BUN 17; Creatinine, Ser 0.76; Potassium 5.0; Sodium 141 07/09/2018: Hemoglobin 12.1; Platelets 443   Recent Lipid Panel Lab Results  Component Value Date/Time   CHOL 151 10/17/2018 09:01 AM   TRIG 161 (H) 10/17/2018 09:01 AM   HDL 60 10/17/2018 09:01 AM   CHOLHDL 2.5 10/17/2018 09:01 AM   LDLCALC 59 10/17/2018 09:01 AM    Wt Readings from Last 3 Encounters:  01/16/19 158 lb (71.7 kg)  10/15/18 158 lb 9.6 oz (71.9 kg)  09/05/18 155 lb 13.8 oz (70.7 kg)     Objective:    Vital Signs:  BP 120/80   Pulse 71   Ht 5' 2.5" (1.588 m)   Wt 158 lb (71.7 kg)   BMI 28.44 kg/m    VITAL SIGNS:  reviewed GEN:  no acute distress EYES:  sclerae anicteric, EOMI - Extraocular Movements Intact RESPIRATORY:  normal respiratory effort, symmetric expansion CARDIOVASCULAR:  no peripheral edema SKIN:  no rash, lesions or ulcers. MUSCULOSKELETAL:  no obvious deformities. NEURO:  alert and oriented x 3, no obvious focal deficit PSYCH:  normal affect  ASSESSMENT & PLAN:    1. S/P CABG x 4   2. Coronary artery disease involving native coronary artery of native heart without angina pectoris   3.  Hypertriglyceridemia    CAD s/p cabg - stable, tolerating medical therapy well. No further chest ache.   Elevated triglycerides - we discussed strategies to managed elevated triglycerides including reducing dietary refined carbohydrate intake and alcohol consumption.   COVID-19 Education: The signs and symptoms of COVID-19 were discussed with the patient and how to seek care for testing (follow up with PCP or arrange E-visit).  The importance of social distancing was discussed today.  Time:   Today, I have spent 10 minutes with the patient with telehealth technology discussing the above problems.   Medication Adjustments/Labs and Tests Ordered: Current medicines are reviewed at length with the patient today.  Concerns regarding medicines are outlined above.   Tests Ordered: No orders of the defined types were placed in this encounter.   Medication Changes: No  orders of the defined types were placed in this encounter.   Disposition:  Follow up in 6 month(s)  Signed, Parke Poisson, MD  01/16/2019 3:03 PM    [facility]

## 2019-01-16 NOTE — Patient Instructions (Signed)
Medication Instructions:  NO CHANGE If you need a refill on your cardiac medications before your next appointment, please call your pharmacy.   Lab work: If you have labs (blood work) drawn today and your tests are completely normal, you will receive your results only by: Marland Kitchen MyChart Message (if you have MyChart) OR . A paper copy in the mail If you have any lab test that is abnormal or we need to change your treatment, we will call you to review the results.  Follow-Up: At [facility], you and your health needs are our priority.  As part of our continuing mission to provide you with exceptional heart care, we have created designated Provider Care Teams.  These Care Teams include your primary Cardiologist (physician) and Advanced Practice Providers (APPs -  Physician Assistants and Nurse Practitioners) who all work together to provide you with the care you need, when you need it. You will need a follow up appointment in 6 months.  Please call our office 2 months in advance to schedule this appointment.  You may see Parke Poisson, MD or one of the following Advanced Practice Providers on your designated Care Team:   Theodore Demark, PA-C . Joni Reining, DNP, ANP

## 2019-01-17 ENCOUNTER — Encounter (HOSPITAL_COMMUNITY): Payer: Self-pay

## 2019-01-17 NOTE — Progress Notes (Signed)
Transitioning to virtual cardiac rehab ° °Dr.  Acharya ° ° As you are aware our department remains closed to patients due to Covid-19.  We are excited to be able to offer an alternative to traditional onsite Cardiac Rehab while your patient continues to follow Re-Open guidelines.  This is a notification that your patient has been contacted and is very interested in participating in Virtual Cardiac Rehab. ° °Thank you for your continued support in helping us meet the health care needs of our patients. ° °Gloria W. Support Rep II ° ° °Cardiac Rehab staff °

## 2019-01-20 ENCOUNTER — Telehealth (HOSPITAL_COMMUNITY): Payer: Self-pay

## 2019-01-20 NOTE — Telephone Encounter (Signed)
° °        Confirm Consent - In the setting of the current Covid19 crisis, you are scheduled for a phone visit with your Cardiac or Pulmonary team member.  Just as we do with many in-gym visits, in order for you to participate in this visit, we must obtain consent.  If you'd like, I can send this to your mychart (if signed up) or email for you to review.  Otherwise, I can obtain your verbal consent now.  By agreeing to a telephone visit, we'd like you to understand that the technology does not allow for your Cardiac or Pulmonary Rehab team member to perform a physical assessment, and thus may limit their ability to fully assess your ability to perform exercise programs. If your provider identifies any concerns that need to be evaluated in person, we will make arrangements to do so.  Finally, though the technology is pretty good, we cannot assure that it will always work on either your or our end and we cannot ensure that we have a secure connection.  Cardiac and Pulmonary Rehab Telehealth visits and At Home cardiac and pulmonary rehab are provided at no cost to you.               Are you willing to proceed?" STAFF: Did the patient verbally acknowledge consent to telehealth visit? Document YES/NO here: Yes      Jessica C.   Cardiac and Pulmonary Rehab Staff   D6/08/2018 T12:22PM

## 2019-01-21 ENCOUNTER — Encounter (HOSPITAL_COMMUNITY): Payer: Self-pay | Admitting: *Deleted

## 2019-01-21 ENCOUNTER — Encounter (HOSPITAL_COMMUNITY)
Admission: RE | Admit: 2019-01-21 | Discharge: 2019-01-21 | Disposition: A | Discharge status: Home / Self Care | Payer: Medicare HMO | Source: Ambulatory Visit | Attending: Internal Medicine | Admitting: Internal Medicine

## 2019-01-21 DIAGNOSIS — Z951 Presence of aortocoronary bypass graft: Secondary | ICD-10-CM | POA: Insufficient documentation

## 2019-01-21 NOTE — Progress Notes (Signed)
Called and spoke to pt regarding Virtual Cardiac Rehab.  Pt  was able to download the [employer] app on their smart device with no issues. Pt set up their account and received the following welcome message -"Welcome to the [facility] program. We hope that you will find the exercise program beneficial in your recovery process. Our staff is available to assist with in questions/concerns about your exercise routine. Best wishes". Brief orientation provided to with the advisement to watch the "Intro to Rehab" series located under the Resource tab. Pt verbalized understanding. Will continue to follow and monitor pt progress with feedback as needed. 

## 2019-02-06 ENCOUNTER — Telehealth (HOSPITAL_COMMUNITY): Payer: Self-pay

## 2019-02-24 NOTE — Progress Notes (Signed)
Discharge Progress Report  Patient Details  Name: Jaime Chavez MRN: [id number] Date of Birth: 24-Feb-1941 Referring Provider:     CARDIAC REHAB PHASE II [facility]  Referring Provider  Parke PoissonAcharya, Gayatri A MD        Number of Visits: 23  Reason for Discharge:  Early Exit:  Lack of attendance and unable to communicate with patient.   Smoking History:  Social History   Tobacco Use  Smoking Status Never Smoker  Smokeless Tobacco Never Used    Diagnosis:  S/P CABG x 4  ADL UCSD:   Initial Exercise Prescription: Initial Exercise Prescription - 09/05/18 1500      Date of Initial Exercise RX and Referring Provider   Date  09/05/18    Referring Provider  Weston BrassAcharya, Gayatri A MD     Expected Discharge Date  12/13/18      Recumbant Bike   Level  1    Watts  10    Minutes  10    METs  1.8      NuStep   Level  2    SPM  75    Minutes  10    METs  2      Track   Laps  8    Minutes  10    METs  2.38      Prescription Details   Frequency (times per week)  3x    Duration  Progress to 30 minutes of continuous aerobic without signs/symptoms of physical distress      Intensity   THRR 40-80% of Max Heartrate  58-116    Ratings of Perceived Exertion  11-13    Perceived Dyspnea  0-4      Progression   Progression  Continue progressive overload as per policy without signs/symptoms or physical distress.      Resistance Training   Training Prescription  Yes    Weight  1lbs    Reps  10-15       Discharge Exercise Prescription (Final Exercise Prescription Changes): Exercise Prescription Changes - 11/01/18 1527      Response to Exercise   Blood Pressure (Admit)  122/72    Blood Pressure (Exercise)  130/64    Blood Pressure (Exit)  112/74    Heart Rate (Admit)  85 bpm    Heart Rate (Exercise)  95 bpm    Heart Rate (Exit)  81 bpm    Rating of Perceived Exertion (Exercise)  12    Perceived Dyspnea  (Exercise)  0    Symptoms  None    Comments  None    Duration  Progress to 30 minutes of  aerobic without signs/symptoms of physical distress    Intensity  THRR unchanged      Progression   Progression  Continue to progress workloads to maintain intensity without signs/symptoms of physical distress.    Average METs  2.64      Resistance Training   Training Prescription  Yes    Weight  2lbs    Reps  10-15    Time  10 Minutes      Recumbant Bike   Level  1    Watts  12    Minutes  10    METs  2.6      NuStep   Level  2    SPM  85    Minutes  10    METs  2.4  Track   Laps  10    Minutes  10    METs  2.76      Home Exercise Plan   Plans to continue exercise at  Home (comment)   Walking & Recumbent Bike    Frequency  Add 2 additional days to program exercise sessions.    Initial Home Exercises Provided  09/20/18       Functional Capacity: 6 Minute Walk    Row Name 09/05/18 1519         6 Minute Walk   Phase  Initial     Distance  1210 feet     Walk Time  6 minutes     # of Rest Breaks  1     MPH  2.29     METS  2.24     RPE  11     Perceived Dyspnea   0     VO2 Peak  7.84     Symptoms  Yes (comment)     Comments  Pt stopped for rest break at 5 minutes and 51 seconds. Pt rested for 8 seconds. Started at 5.59.      Resting HR  66 bpm     Resting BP  118/70     Resting Oxygen Saturation   98 %     Exercise Oxygen Saturation  during 6 min walk  98 %     Max Ex. HR  98 bpm     Max Ex. BP  122/72     2 Minute Post BP  122/76        Psychological, QOL, Others - Outcomes: PHQ 2/9: [facility] 05/09/2018 02/25/2018  Decreased Interest 0 0  Down, Depressed, Hopeless 0 0  PHQ - 2 Score 0 0    Quality of Life: Quality of Life - 09/05/18 1524      Quality of Life   Select  Quality of Life      Quality of Life Scores   Health/Function Pre  20.77 %    Socioeconomic Pre  26.36 %    Psych/Spiritual Pre  27.86 %    Family Pre  27.6 %     GLOBAL Pre  24.38 %       Personal Goals: Goals established at orientation with interventions provided to work toward goal. Personal Goals and Risk Factors at Admission - 09/05/18 1503      Core Components/Risk Factors/Patient Goals on Admission    Weight Management  Yes;Weight Loss    Intervention  Weight Management: Develop a combined nutrition and exercise program designed to reach desired caloric intake, while maintaining appropriate intake of nutrient and fiber, sodium and fats, and appropriate energy expenditure required for the weight goal.;Weight Management: Provide education and appropriate resources to help participant work on and attain dietary goals.;Weight Management/Obesity: Establish reasonable short term and long term weight goals.    Admit Weight  155 lb 13.8 oz (70.7 kg)    Goal Weight: Long Term  145 lb (65.8 kg)    Expected Outcomes  Short Term: Continue to assess and modify interventions until short term weight is achieved;Long Term: Adherence to nutrition and physical activity/exercise program aimed toward attainment of established weight goal;Weight Loss: Understanding of general recommendations for a balanced deficit meal plan, which promotes 1-2 lb weight loss per week and includes a negative energy balance of [phone] kcal/d;Understanding recommendations for meals to include 15-35% energy as protein, 25-35% energy from fat, 35-60% energy from carbohydrates,  less than  of dietary cholesterol, 20-35 gm of total fiber daily;Understanding of distribution of calorie intake throughout the day with the consumption of 4-5 meals/snacks    Hypertension  Yes    Intervention  Provide education on lifestyle modifcations including regular physical activity/exercise, weight management, moderate sodium restriction and increased consumption of fresh fruit, vegetables, and low fat dairy, alcohol moderation, and smoking cessation.;Monitor prescription use compliance.    Expected Outcomes   Short Term: Continued assessment and intervention until BP is < 140/33mm HG in hypertensive participants. < 130/20mm HG in hypertensive participants with diabetes, heart failure or chronic kidney disease.;Long Term: Maintenance of blood pressure at goal levels.    Lipids  Yes    Intervention  Provide education and support for participant on nutrition & aerobic/resistive exercise along with prescribed medications to achieve LDL 70mg , HDL >40mg .    Expected Outcomes  Short Term: Participant states understanding of desired cholesterol values and is compliant with medications prescribed. Participant is following exercise prescription and nutrition guidelines.;Long Term: Cholesterol controlled with medications as prescribed, with individualized exercise RX and with personalized nutrition plan. Value goals: LDL < , HDL > 40 mg.        Personal Goals Discharge: Goals and Risk Factor Review    Row Name 09/26/18 1220 10/24/18 1519 11/07/18 1607         Core Components/Risk Factors/Patient Goals Review   Personal Goals Review  Weight Management/Obesity;Hypertension;Lipids  Weight Management/Obesity;Hypertension;Lipids  Weight Management/Obesity;Hypertension;Lipids     Review  Pt with multiple CAD RFs willing to participate in CR exercise.  [name] is tolerating exercise well. [name] would like to get back to walking more.   Pt with multiple CAD RFs willing to participate in CR exercise.  [name] is tolerating exercise well.  VSS.  She feels that she is increasing her strength.   Pt continues to tolerate exercise well.  Exercise currently on hold as department is closed per recommended guidelines from federal government to prevent spread of COVID-19.      Expected Outcomes  Pt will continue to participate in CR exercise, nutrition, and lifestyle modification opportunities.   Pt will continue to participate in CR exercise, nutrition, and lifestyle modification opportunities.   Pt will continue to  participate in CR exercise, nutrition, and lifestyle modification opportunities.         Exercise Goals and Review: Exercise Goals    Row Name 09/05/18 1407             Exercise Goals   Increase Physical Activity  Yes       Intervention  Provide advice, education, support and counseling about physical activity/exercise needs.;Develop an individualized exercise prescription for aerobic and resistive training based on initial evaluation findings, risk stratification, comorbidities and participant's personal goals.       Expected Outcomes  Short Term: Attend rehab on a regular basis to increase amount of physical activity.;Long Term: Exercising regularly at least 3-5 days a week.;Long Term: Add in home exercise to make exercise part of routine and to increase amount of physical activity.       Increase Strength and Stamina  Yes       Intervention  Provide advice, education, support and counseling about physical activity/exercise needs.;Develop an individualized exercise prescription for aerobic and resistive training based on initial evaluation findings, risk stratification, comorbidities and participant's personal goals.       Expected Outcomes  Short Term: Perform resistance training exercises routinely during rehab and add in resistance training  at home;Long Term: Improve cardiorespiratory fitness, muscular endurance and strength as measured by increased METs and functional capacity (6MWT);Short Term: Increase workloads from initial exercise prescription for resistance, speed, and METs.       Able to understand and use rate of perceived exertion (RPE) scale  Yes       Intervention  Provide education and explanation on how to use RPE scale       Expected Outcomes  Short Term: Able to use RPE daily in rehab to express subjective intensity level;Long Term:  Able to use RPE to guide intensity level when exercising independently       Knowledge and understanding of Target Heart Rate Range (THRR)   Yes       Intervention  Provide education and explanation of THRR including how the numbers were predicted and where they are located for reference       Expected Outcomes  Short Term: Able to state/look up THRR;Long Term: Able to use THRR to govern intensity when exercising independently;Short Term: Able to use daily as guideline for intensity in rehab       Able to check pulse independently  Yes       Intervention  Provide education and demonstration on how to check pulse in carotid and radial arteries.;Review the importance of being able to check your own pulse for safety during independent exercise       Expected Outcomes  Short Term: Able to explain why pulse checking is important during independent exercise;Long Term: Able to check pulse independently and accurately       Understanding of Exercise Prescription  Yes       Intervention  Provide education, explanation, and written materials on patient's individual exercise prescription       Expected Outcomes  Short Term: Able to explain program exercise prescription;Long Term: Able to explain home exercise prescription to exercise independently          Exercise Goals Re-Evaluation: Exercise Goals Re-Evaluation    Row Name 09/23/18 1648 10/24/18 1503           Exercise Goal Re-Evaluation   Exercise Goals Review  Increase Physical Activity;Able to understand and use rate of perceived exertion (RPE) scale;Knowledge and understanding of Target Heart Rate Range (THRR);Understanding of Exercise Prescription;Increase Strength and Stamina;Able to check pulse independently  Increase Physical Activity;Understanding of Exercise Prescription      Comments  Reviewed HEP with pt. Also reviewed THRR, RPE Scale, weather conditions, endpoints of exericse, NTG use/when to call 911, warmup and cool down.   Pt is responding well to exercise prescription. Pt is enjoying program and is working towards her goals of increasing her strength and stamina.         Expected Outcomes  Pt will plan to walk and use home exercise bike 2-3 days a week for 30-45 minutes. Pt will continue to increase cardiovascular strength. Will continue to monitor and progress pt.   Pt is not exercising consistenly at home. Encouraged pt to walk or use home exercise bike 2-3 days a week, for 30 minutes. Pt stated she will work towards that. Will continue to monitor and progress pt.          Nutrition & Weight - Outcomes: Pre Biometrics - 09/05/18 1502      Pre Biometrics   Height  5\' 2"  (1.575 m)    Weight  70.7 kg    Waist Circumference  40 inches    Hip Circumference  43 inches  Waist to Hip Ratio  0.93 %    BMI (Calculated)  28.5    Triceps Skinfold  32 mm    % Body Fat  42.8 %    Grip Strength  23.5 kg    Flexibility  12 in    Single Leg Stand  11 seconds        Nutrition: Nutrition Therapy & Goals - 09/05/18 1605      Nutrition Therapy   Diet  heart healthy, carb modified      Personal Nutrition Goals   Nutrition Goal  Pt to identify and limit food sources of saturated fat, trans fat, refined carbohydrates and sodium    Personal Goal #2  Pt to identify food quantities necessary to achieve weight loss of 6-24 lbs. at graduation from cardiac rehab.     Personal Goal #3  Pt able to name foods that affect blood glucose.      Intervention Plan   Intervention  Prescribe, educate and counsel regarding individualized specific dietary modifications aiming towards targeted core components such as weight, hypertension, lipid management, diabetes, heart failure and other comorbidities.    Expected Outcomes  Short Term Goal: Understand basic principles of dietary content, such as calories, fat, sodium, cholesterol and nutrients.;Long Term Goal: Adherence to prescribed nutrition plan.       Nutrition Discharge: Nutrition Assessments - 09/05/18 1604      MEDFICTS Scores   Pre Score  46       Education Questionnaire Score: Knowledge Questionnaire Score -  09/05/18 1407      Knowledge Questionnaire Score   Pre Score  20/24       Goals reviewed with patient; copy given to patient.

## 2019-02-24 NOTE — Addendum Note (Signed)
Encounter addended by: Noel Christmas, RN on: 02/24/2019 4:17 PM  Actions taken: Clinical Note Signed

## 2019-03-15 IMAGING — MG DIGITAL DIAGNOSTIC UNILATERAL RIGHT MAMMOGRAM WITH TOMO AND CAD
6 of 10 series · 6 of 30 positions shown · non-contrast
Comparison: Previous exam(s).

CLINICAL DATA: Screening recall for right breast masses and
asymmetry.

EXAM:
DIGITAL DIAGNOSTIC UNILATERAL RIGHT MAMMOGRAM WITH CAD AND TOMO
RIGHT BREAST ULTRASOUND

[R MLO synth-2D (1 of 2)]
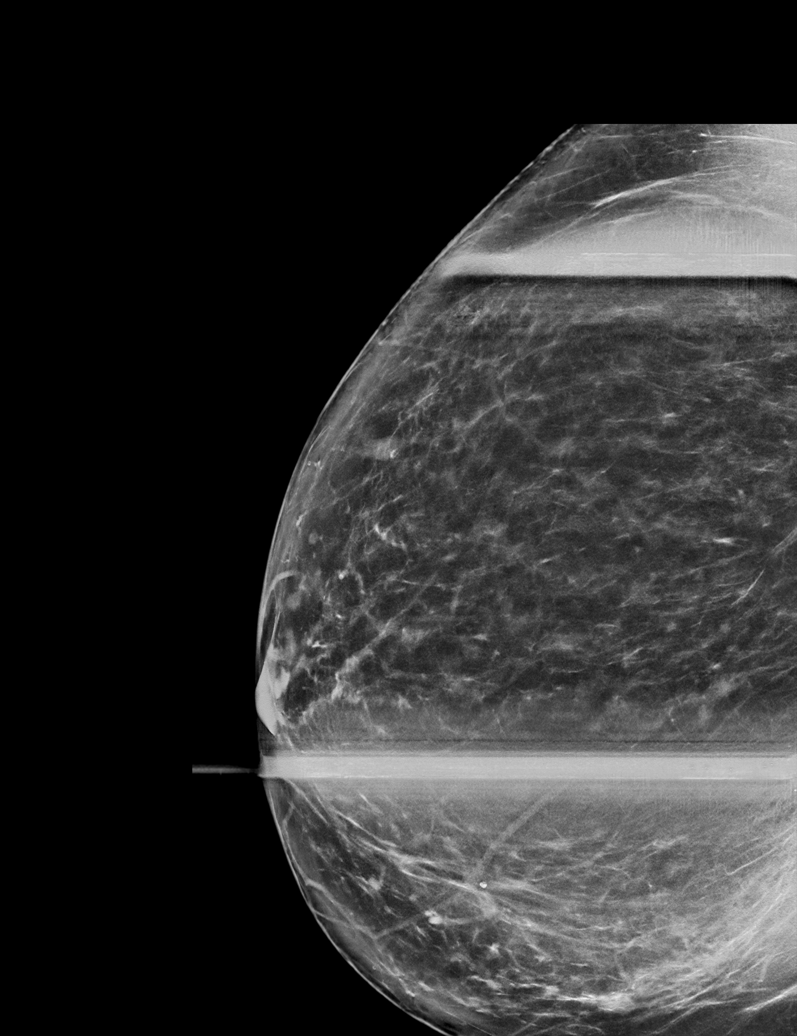

[R CC synth-2D (1 of 2)]
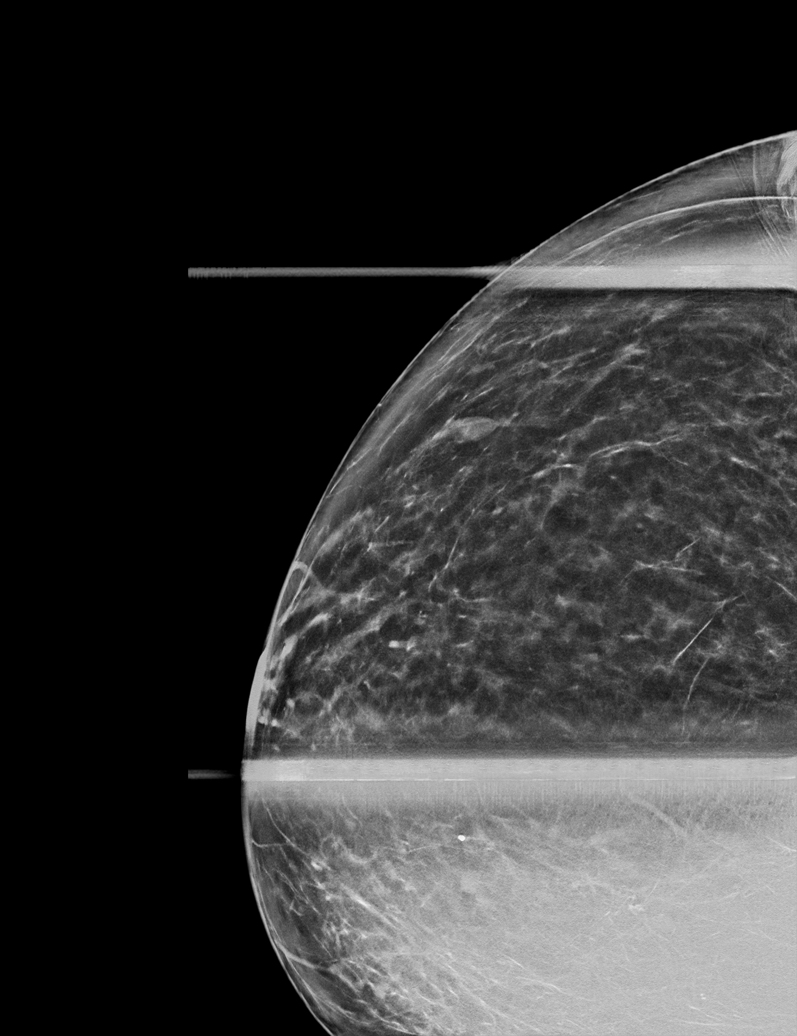

[R ML synth-2D]
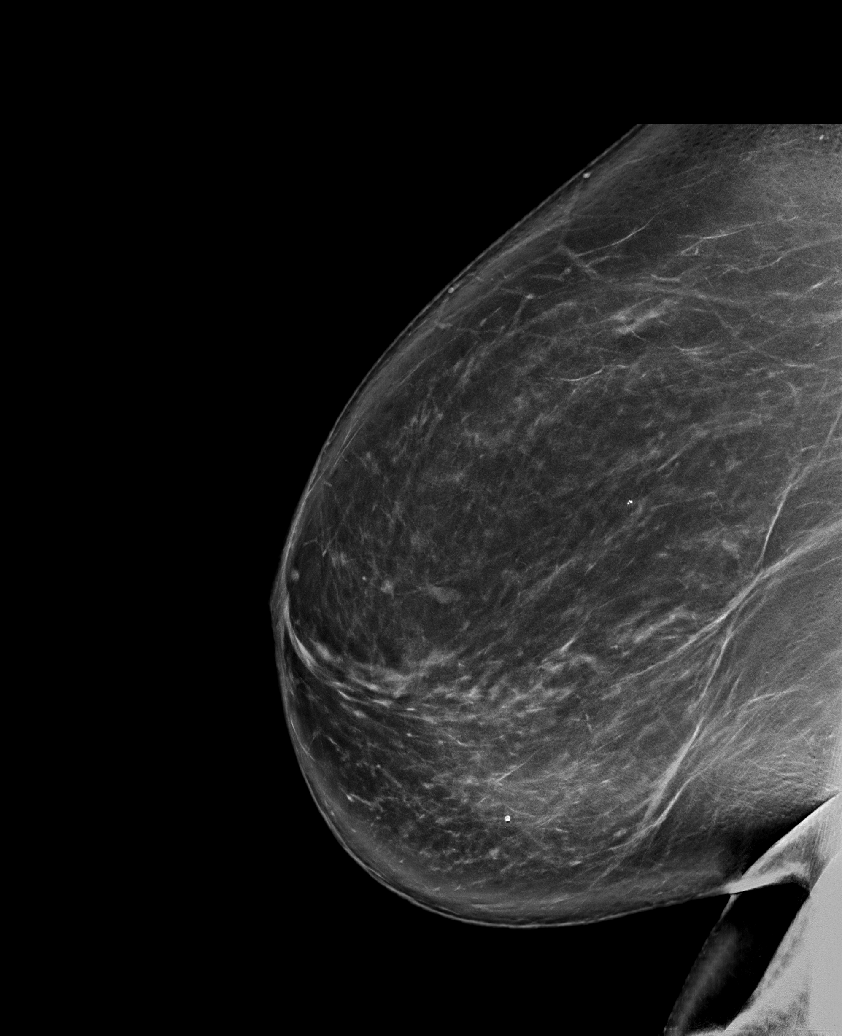

[R CC synth-2D (2 of 2)]
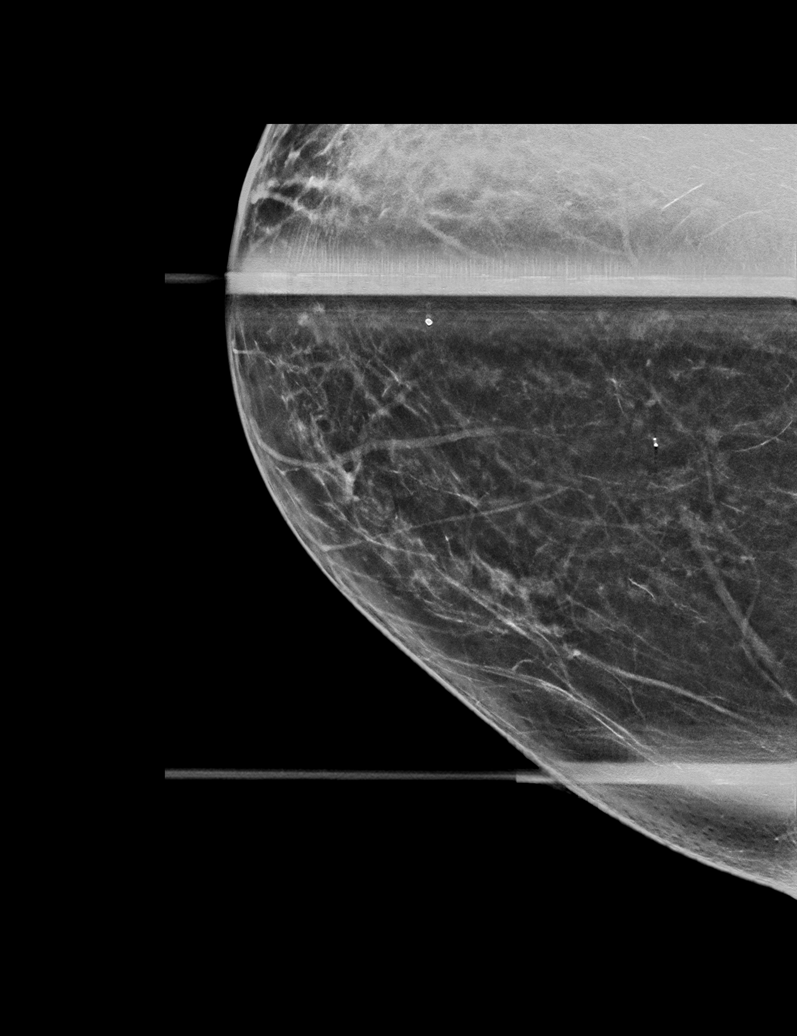

[R MLO synth-2D (2 of 2)]
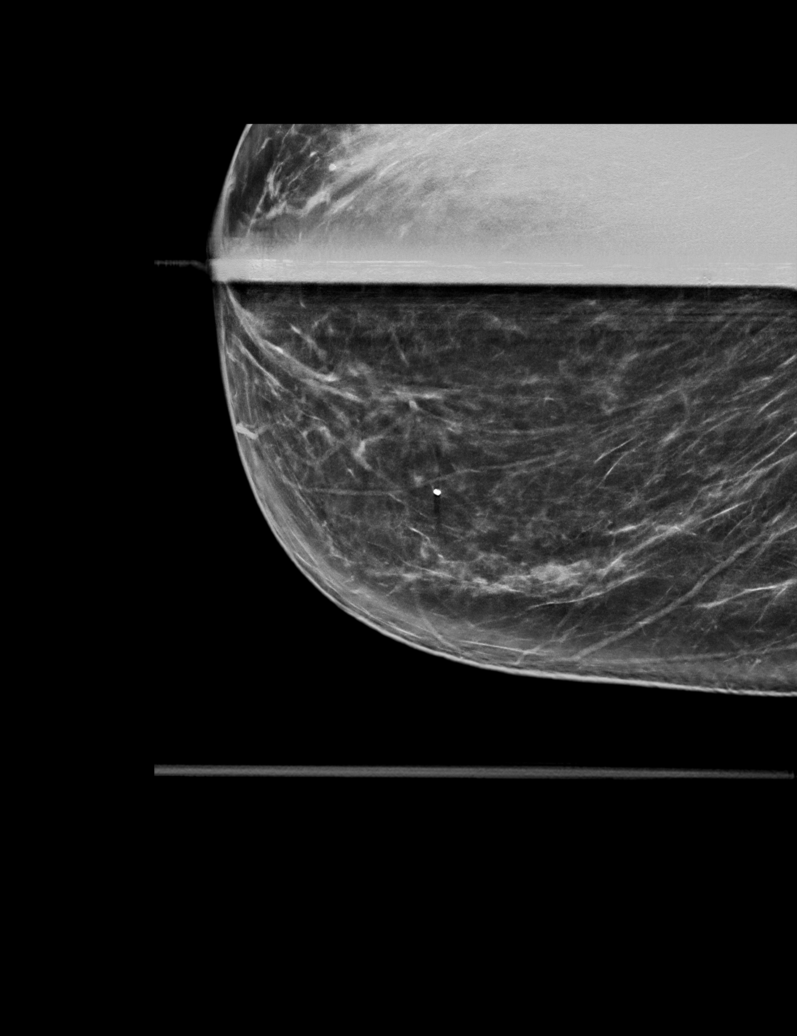

[R CC tomo · tomo slice 35/69.0]
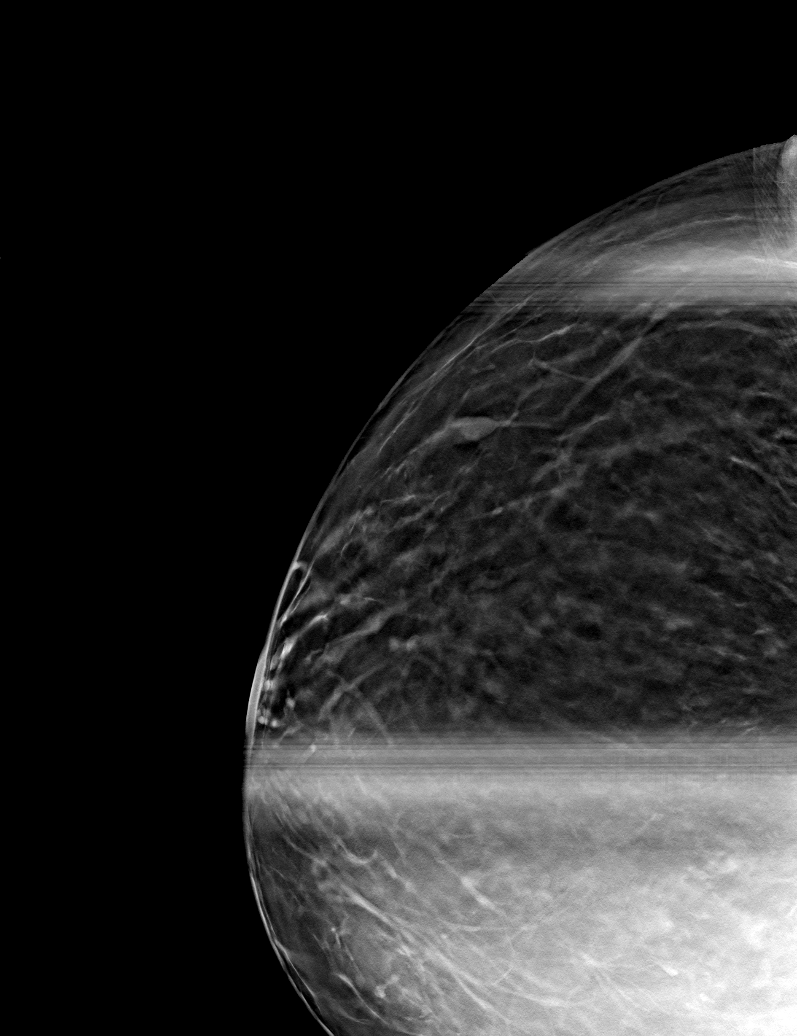

[6 of 30 positions shown; findings below may reference images not displayed]

ACR Breast Density Category c: The breast tissue is heterogeneously
dense, which may obscure small masses.
FINDINGS: Additional tomograms were performed of the left breast. There is an
oval circumscribed mass in the outer right breast measuring
approximately 1 cm. In addition there is an oval circumscribed mass
in the lower inner right breast measuring 0.8 mm. There are 1-2 oval
circumscribed masses in the upper central right breast measuring
cm. The initially questioned asymmetry in the inner right breast
appears to resolve on the additional imaging with findings
suggestive of overlapping fibroglandular tissue.

Mammographic images were processed with CAD.

Targeted ultrasound of the right breast was performed demonstrating
several small cysts, with a cluster of cysts at 1 o'clock 4 cm from
nipple measuring 0.8 x 0.5 cm. A cyst at 12 o'clock 2 cm from nipple
measures 0.4 x 0.2 x 0.4 cm. These account well for the small masses
seen in the central to upper right breast at mammography.

A cluster of cysts at 4 o'clock 6 cm from the nipple measuring 0.5 x
0.2 x 0.6 cm. This accounts well from the mass seen in the lower
inner right breast at mammography.

Targeted ultrasound of the entire outer right breast was performed
demonstrating several mildly dilated ducts with a duct at 9 o'clock
6 cm from the nipple measuring 0.6 x 0.2 x 0.8 cm. This is felt to
correspond well with the mass seen in the outer right breast at
mammography. No suspicious masses or abnormalities seen.
IMPRESSION: No findings of malignancy in the right breast.

RECOMMENDATION:
Screening mammogram in one year.(Code:EJ-[id number]-X75)

I have discussed the findings and recommendations with the patient.
Results were also provided in writing at the conclusion of the
visit. If applicable, a reminder letter will be sent to the patient
regarding the next appointment.

BI-RADS CATEGORY  2: Benign.

## 2019-06-16 ENCOUNTER — Encounter: Payer: Self-pay | Admitting: Thoracic Surgery (Cardiothoracic Vascular Surgery)

## 2019-06-16 ENCOUNTER — Telehealth (INDEPENDENT_AMBULATORY_CARE_PROVIDER_SITE_OTHER): Payer: Medicare HMO | Admitting: Thoracic Surgery (Cardiothoracic Vascular Surgery)

## 2019-06-16 ENCOUNTER — Other Ambulatory Visit: Payer: Self-pay

## 2019-06-16 DIAGNOSIS — Z951 Presence of aortocoronary bypass graft: Secondary | ICD-10-CM | POA: Diagnosis not present

## 2019-06-16 NOTE — Patient Instructions (Signed)
Continue all previous medications without any changes at this time  

## 2019-06-16 NOTE — Progress Notes (Signed)
[address]       [place] [address]             [phone]     CARDIOTHORACIC SURGERY TELEPHONE VIRTUAL OFFICE NOTE  Referring Provider is End, Cristal Deer, MD Primary Cardiologist is Parke Poisson, MD PCP is Shirlean Mylar, MD   HPI:  I spoke with Jaime Chavez (DOB June 19, 1941 ) via telephone on 06/16/2019 at 12:55 PM and verified that I was speaking with the correct person using more than one form of identification.  We discussed the reason(s) for conducting our visit virtually instead of in-person.  The patient expressed understanding the circumstances and agreed to proceed as described.   Patient is 78 year old female who underwent coronary artery bypass grafting x4 on June 14, 2018 for severe three-vessel coronary artery disease with unstable angina pectoris.  Her early postoperative recovery was uneventful and she was last seen here in our office on September 02, 2018 at which time she was doing well.  I spoke with her over the telephone today to see how she is doing.   She states that overall she is doing fairly well although she complains that her exercise tolerance is not what she would expect.  She states that she still gets short of breath and tired with exertion.  She denies any exertional chest pain or chest tightness.  She has not had palpitations or dizzy spells.  She denies lower extremity edema.  She had a follow-up telemedicine visit with Dr. Jacques Navy last May.  Plavix was stopped at that time.  She reports no other new problems or complaints.   Current Outpatient Medications  Medication Sig Dispense Refill  . acetaminophen (TYLENOL) 325 MG tablet Take 650 mg by mouth every 6 (six) hours as needed.    Marland Kitchen aspirin EC 81 MG tablet Take 1 tablet (81 mg total) by mouth daily. 60 tablet 2  . calcium gluconate 500 MG tablet Take 1 tablet by mouth 3 (three) times daily.    . fluticasone (FLONASE) 50 MCG/ACT nasal spray Place into both nostrils daily.     Marland Kitchen glucosamine-chondroitin 500-400 MG tablet Take 1 tablet by mouth 3 (three) times daily.    . isosorbide mononitrate (IMDUR) 30 MG 24 hr tablet Take 1 tablet (30 mg total) by mouth daily. 90 tablet 3  . metoprolol tartrate (LOPRESSOR) 25 MG tablet Take 1 tablet (25 mg total) by mouth 2 (two) times daily. 180 tablet 3  . rosuvastatin (CRESTOR) 20 MG tablet Take 1 tablet (20 mg total) by mouth daily. 90 tablet 3  . traMADol (ULTRAM) 50 MG tablet Take by mouth every 6 (six) hours as needed.     No current facility-administered medications for this visit.      Diagnostic Tests:  n/a   Impression:  Patient seems to be doing reasonably well approximately 1 year status post coronary artery bypass grafting.  She does complain that her exercise tolerance has not recovered to the point that she had hoped and she still gets fatigued or short of breath with more strenuous exertion.  She does not exercise on a regular basis.  She is found to be on appropriate medical therapy and she is not having symptoms suspicious for angina pectoris.    Plan:  We have not recommended any change the patient's current medications.  I have encouraged the patient to continue to gradually increase her physical activity and to work more specifically on building her exercise tolerance  with a walking program or perhaps enrollment in the cardiac rehab program.  At some point it might be reasonable to consider follow-up echocardiogram and/or stress test.  She will continue to follow-up with Dr. Margaretann Loveless and return to our office in the future only should specific problems or questions arise.  All questions answered.    I discussed limitations of evaluation and management via telephone.  The patient was advised to call back for repeat telephone consultation or to seek an in-person evaluation if questions arise or the patient's clinical condition changes in any significant manner.  I spent in excess of 5 minutes of  non-face-to-face time during the conduct of this telephone virtual office consultation.     Valentina Gu. Roxy Manns, MD 06/16/2019 12:55 PM

## 2019-06-18 ENCOUNTER — Telehealth: Payer: Self-pay | Admitting: Family Medicine

## 2019-06-18 NOTE — Telephone Encounter (Signed)
Pt recently saw cardiologist and appears to be doing well. On review of chart, pt has not been seen at [facility] for 1 year and had CABG 1 year ago. Recommended that she schedule an annual exam at her convenience.  Gladys Damme, MD [place], PGY-1

## 2019-07-30 ENCOUNTER — Telehealth (INDEPENDENT_AMBULATORY_CARE_PROVIDER_SITE_OTHER): Payer: Medicare HMO | Admitting: Internal Medicine

## 2019-07-30 ENCOUNTER — Telehealth: Payer: Self-pay

## 2019-07-30 ENCOUNTER — Encounter: Payer: Self-pay | Admitting: Internal Medicine

## 2019-07-30 VITALS — BP 119/86 | HR 73 | Ht 62.5 in | Wt 164.0 lb

## 2019-07-30 DIAGNOSIS — Z951 Presence of aortocoronary bypass graft: Secondary | ICD-10-CM | POA: Diagnosis not present

## 2019-07-30 DIAGNOSIS — R0600 Dyspnea, unspecified: Secondary | ICD-10-CM | POA: Diagnosis not present

## 2019-07-30 DIAGNOSIS — I2510 Atherosclerotic heart disease of native coronary artery without angina pectoris: Secondary | ICD-10-CM | POA: Diagnosis not present

## 2019-07-30 DIAGNOSIS — E781 Pure hyperglyceridemia: Secondary | ICD-10-CM

## 2019-07-30 MED ORDER — NITROGLYCERIN 0.4 MG SL SUBL: 0.4000 mg | SUBLINGUAL_TABLET | SUBLINGUAL | 3 refills | Status: DC | PRN

## 2019-07-30 MED ORDER — METOPROLOL TARTRATE 50 MG PO TABS: 50.0000 mg | ORAL_TABLET | Freq: Two times a day (BID) | ORAL | 1 refills | Status: DC

## 2019-07-30 NOTE — Progress Notes (Signed)
Virtual Visit via Video Note   This visit type was conducted due to national recommendations for restrictions regarding the COVID-19 Pandemic (e.g. social distancing) in an effort to limit this patient's exposure and mitigate transmission in our community.  Due to her co-morbid illnesses, this patient is at least at moderate risk for complications without adequate follow up.  This format is felt to be most appropriate for this patient at this time.  All issues noted in this document were discussed and addressed.  A limited physical exam was performed with this format.  Please refer to the patient's chart for her consent to telehealth for [facility]   Date:  07/30/2019   ID:  Jaime Chavez, DOB 12/12/1940, MRN [id number]  Patient Location: Home Provider Location: Home  PCP:  Gladys Damme, MD  Cardiologist:  Elouise Munroe, MD  Electrophysiologist:  None   Evaluation Performed:  Follow-Up Visit  Chief Complaint:  F/u CAD s/p CABG   History of Present Illness:    Jaime Chavez is a 78 y.o. female with unstable angina s/p CABG x 4 June 14, 2018. She has a history of GERD, HLD.   She is overall feeling well but does note right side chest pressure with going up inclines.  She will also have shortness of breath.  When she stops to rest it will not last a significant period of time, but she has daily symptoms reliably with inclines. She notes that metoprolol helps with symptoms.   The patient denies dyspnea at rest, palpitations, PND, orthopnea, or leg swelling. Denies syncope or presyncope. Denies dizziness or lightheadedness.  The patient does not have symptoms concerning for COVID-19 infection (fever, chills, cough, or new shortness of breath).    Past Medical History:  Diagnosis Date  . Acid reflux   . Coronary artery disease involving native coronary artery of native heart with angina pectoris ([place]) 06/13/2018  . Dyspnea   . Environmental allergies   . Hyperlipidemia  2014  . Left patella fracture 2005  . 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 CARDIAC CATHETERIZATION  06/13/2018  . CORONARY ARTERY BYPASS GRAFT N/A 06/14/2018   Procedure: CORONARY ARTERY BYPASS GRAFTING (CABG) times 4 using left internal mammary artery and right saphenous vein using endoscope.;  Surgeon: Rexene Alberts, MD;  Location: [place];  Service: Open Heart Surgery;  Laterality: N/A;  . DILATION AND CURETTAGE, DIAGNOSTIC / THERAPEUTIC     for nonmalignant polyp  . RIGHT/LEFT HEART CATH AND CORONARY ANGIOGRAPHY N/A 06/13/2018   Procedure: RIGHT/LEFT HEART CATH AND CORONARY ANGIOGRAPHY;  Surgeon: Nelva Bush, MD;  Location: [place] [facility];  Service: Cardiovascular;  Laterality: N/A;  . TEE WITHOUT CARDIOVERSION N/A 06/14/2018   Procedure: TRANSESOPHAGEAL ECHOCARDIOGRAM (TEE);  Surgeon: Rexene Alberts, MD;  Location: [place];  Service: Open Heart Surgery;  Laterality: N/A;     Current Meds  Medication Sig  . acetaminophen (TYLENOL) 325 MG tablet Take 650 mg by mouth every 6 (six) hours as needed.  Marland Kitchen aspirin EC 81 MG tablet Take 1 tablet (81 mg total) by mouth daily.  . calcium carbonate (CALCIUM 600) 600 MG TABS tablet Take 600 mg by mouth 2 (two) times daily with a meal.  . cetirizine (ALLERGY 24HOUR INDOOR/OUTDOOR) 10 MG tablet Take 10 mg by mouth daily.  Marland Kitchen DOXYCYCLINE  HYCLATE PO Take 100 mg by mouth 2 (two) times daily.  . fluticasone (FLONASE) 50 MCG/ACT nasal spray Place into both nostrils daily.  . Glucosamine-Chondroit-Vit C-Mn (GLUCOSAMINE CHONDR 1500 COMPLX) CAPS Take 1 capsule by mouth 2 (two) times daily.  . isosorbide mononitrate (IMDUR) 30 MG 24 hr tablet Take 1 tablet (30 mg total) by mouth daily.  . metoprolol tartrate (LOPRESSOR) 50 MG tablet Take 1 tablet (50 mg total) by mouth 2 (two) times daily.  . Multiple Vitamins-Minerals  (CENTRUM SILVER 50+WOMEN) TABS Take 1 tablet by mouth daily.  . rosuvastatin (CRESTOR) 20 MG tablet Take 1 tablet (20 mg total) by mouth daily.  . traMADol (ULTRAM) 50 MG tablet Take by mouth every 6 (six) hours as needed.  . [DISCONTINUED] metoprolol tartrate (LOPRESSOR) 25 MG tablet Take 1 tablet (25 mg total) by mouth 2 (two) times daily.     Allergies:   Osteoporosis support [a-g pro] and Alendronate sodium   Social History   Tobacco Use  . Smoking status: Never Smoker  . Smokeless tobacco: Never Used  Substance Use Topics  . Alcohol use: Yes    Comment: wine and liquor, about 1 drink per day  . Drug use: Never     Family Hx: The patient's family history includes Breast cancer in her paternal aunt; Colon cancer in her sister; Heart attack in her father; Other in her father; Pancreatic cancer in her mother.  ROS:   Please see the history of present illness.     All other systems reviewed and are negative.   Prior CV studies:   The following studies were reviewed today:    Labs/Other Tests and Data Reviewed:    EKG:  No ECG reviewed.  Recent Labs: No results found for requested labs within last 8760 hours.   Recent Lipid Panel Lab Results  Component Value Date/Time   CHOL 151 10/17/2018 09:01 AM   TRIG 161 (H) 10/17/2018 09:01 AM   HDL 60 10/17/2018 09:01 AM   CHOLHDL 2.5 10/17/2018 09:01 AM   LDLCALC 59 10/17/2018 09:01 AM    Wt Readings from Last 3 Encounters:  07/30/19 164 lb (74.4 kg)  01/16/19 158 lb (71.7 kg)  10/15/18 158 lb 9.6 oz (71.9 kg)     Objective:    Vital Signs:  BP 119/86   Pulse 73   Ht 5' 2.5" (1.588 m)   Wt 164 lb (74.4 kg)   BMI 29.52 kg/m    VITAL SIGNS:  reviewed GEN:  no acute distress EYES:  sclerae anicteric, EOMI - Extraocular Movements Intact RESPIRATORY:  normal respiratory effort, symmetric expansion CARDIOVASCULAR:  no peripheral edema SKIN:  no rash, lesions or ulcers. MUSCULOSKELETAL:  no obvious deformities.  NEURO:  alert and oriented x 3, no obvious focal deficit PSYCH:  normal affect  ASSESSMENT & PLAN:    1. Coronary artery disease involving native coronary artery of native heart without angina pectoris   2. S/P CABG x 4   3. Hypertriglyceridemia   4. Dyspnea, unspecified type    CAD status post CABG-she is having some atypical symptoms and dyspnea on exertion going up inclines.  She feels that metoprolol helps with this symptom.  With that in mind, we will increase her metoprolol dose to 50 mg twice daily and monitor for effect.  I would like to follow-up with her in 4 weeks to see if this helped.  If it was not successful, increasing her Imdur may be a second strategy.  In  addition I will provide a prescription for sublingual nitroglycerin and have encouraged her to take it at times when she has the symptoms to see if it is beneficial.  She continues to have mild chest wall soreness in relation to previous sternotomy, but has no concerns about her sternotomy wound healing appropriately.  She does not even require Tylenol for this discomfort and will monitor it.  Hypertriglyceridemia-we have discussed treatment strategies in the past, I have encouraged alcohol cessation and diet modification.  She continues on Crestor 20 mg daily.  We should recheck a lipid panel at her next visit to reevaluate.  LDL is at goal.   COVID-19 Education: The signs and symptoms of COVID-19 were discussed with the patient and how to seek care for testing (follow up with PCP or arrange E-visit).  The importance of social distancing was discussed today.  Time:   Today, I have spent 17 minutes with the patient with telehealth technology discussing the above problems. Total of 25 minutes reviewing     Medication Adjustments/Labs and Tests Ordered: Current medicines are reviewed at length with the patient today.  Concerns regarding medicines are outlined above.   Tests Ordered: No orders of the defined types were  placed in this encounter.   Medication Changes: Meds ordered this encounter  Medications  . nitroGLYCERIN (NITROSTAT) 0.4 MG SL tablet    Sig: Place 1 tablet (0.4 mg total) under the tongue every 5 (five) minutes as needed for chest pain.    Dispense:  25 tablet    Refill:  3  . metoprolol tartrate (LOPRESSOR) 50 MG tablet    Sig: Take 1 tablet (50 mg total) by mouth 2 (two) times daily.    Dispense:  60 tablet    Refill:  1    Follow Up:  Either In Person or Virtual in 4 week(s)  Signed, Parke Poisson, MD  07/30/2019 10:30 AM    [facility]

## 2019-07-30 NOTE — Patient Instructions (Addendum)
Medication Instructions:  INCREASE Metoprolol to 50mg  Take 1 tablet twice a day START Nitroglycerin This medication is to be taken for emergency chest pain. When taking this medication place the first tablet under your tongue. IF STILL HAVING CHEST PAIN after five (5) minutes call 911 before placing second dose under your tongue. If still having chest pain wait five (5) minnutes before taking third dose. DO NOT TAKE MORE THAN 3 DOSES IN 15 MINUTES. *If you need a refill on your cardiac medications before your next appointment, please call your pharmacy*  Lab Work: None  If you have labs (blood work) drawn today and your tests are completely normal, you will receive your results only by: Marland Kitchen MyChart Message (if you have MyChart) OR . A paper copy in the mail If you have any lab test that is abnormal or we need to change your treatment, we will call you to review the results.  Testing/Procedures: None   Follow-Up: At [facility], you and your health needs are our priority.  As part of our continuing mission to provide you with exceptional heart care, we have created designated Provider Care Teams.  These Care Teams include your primary Cardiologist (physician) and Advanced Practice Providers (APPs -  Physician Assistants and Nurse Practitioners) who all work together to provide you with the care you need, when you need it.  Your next appointment:   4 week(s)  The format for your next appointment:   Either In Person or Virtual  Provider:   Cherlynn Kaiser, MD  Other Instructions

## 2019-07-30 NOTE — Telephone Encounter (Signed)
Virtual Visit Pre-Appointment Phone Call  "(Name), I am calling you today to discuss your upcoming appointment. We are currently trying to limit exposure to the virus that causes COVID-19 by seeing patients at home rather than in the office."  1. "What is the BEST phone number to call the day of the visit?" - include this in appointment notes  2. "Do you have or have access to (through a family member/friend) a smartphone with video capability that we can use for your visit?" a. If yes - list this number in appt notes as "cell" (if different from BEST phone #) and list the appointment type as a VIDEO visit in appointment notes b. If no - list the appointment type as a PHONE visit in appointment notes  3. Confirm consent - "In the setting of the current Covid19 crisis, you are scheduled for a (phone or video) visit with your provider on (date) at (time).  Just as we do with many in-office visits, in order for you to participate in this visit, we must obtain consent.  If you'd like, I can send this to your mychart (if signed up) or email for you to review.  Otherwise, I can obtain your verbal consent now.  All virtual visits are billed to your insurance company just like a normal visit would be.  By agreeing to a virtual visit, we'd like you to understand that the technology does not allow for your provider to perform an examination, and thus may limit your provider's ability to fully assess your condition. If your provider identifies any concerns that need to be evaluated in person, we will make arrangements to do so.  Finally, though the technology is pretty good, we cannot assure that it will always work on either your or our end, and in the setting of a video visit, we may have to convert it to a phone-only visit.  In either situation, we cannot ensure that we have a secure connection.  Are you willing to proceed?" STAFF: Did the patient verbally acknowledge consent to telehealth visit? Document  YES/NO here: YES  4. Advise patient to be prepared - "Two hours prior to your appointment, go ahead and check your blood pressure, pulse, oxygen saturation, and your weight (if you have the equipment to check those) and write them all down. When your visit starts, your provider will ask you for this information. If you have an Apple Watch or Kardia device, please plan to have heart rate information ready on the day of your appointment. Please have a pen and paper handy nearby the day of the visit as well."  5. Give patient instructions for MyChart download to smartphone OR Doximity/Doxy.me as below if video visit (depending on what platform provider is using)  6. Inform patient they will receive a phone call 15 minutes prior to their appointment time (may be from unknown caller ID) so they should be prepared to answer    [place] has been deemed a candidate for a follow-up tele-health visit to limit community exposure during the Covid-19 pandemic. I spoke with the patient via phone to ensure availability of phone/video source, confirm preferred email & phone number, and discuss instructions and expectations.  I reminded [name] to be prepared with any vital sign and/or heart rhythm information that could potentially be obtained via home monitoring, at the time of her visit. I reminded [name] to expect a phone call prior to her visit.  Lucita Ferrara, CMA 07/30/2019 8:38 AM   INSTRUCTIONS FOR DOWNLOADING THE MYCHART APP TO SMARTPHONE  - The patient must first make sure to have activated MyChart and know their login information - If Apple, go to [employer] and type in MyChart in the search bar and download the app. If Android, ask patient to go to [employer] and type in [place] in the search bar and download the app. The app is free but as with any other app downloads, their phone may require them to verify saved payment information or Apple/Android  password.  - The patient will need to then log into the app with their MyChart username and password, and select [place] as their healthcare provider to link the account. When it is time for your visit, go to the MyChart app, find appointments, and click Begin Video Visit. Be sure to Select Allow for your device to access the Microphone and Camera for your visit. You will then be connected, and your provider will be with you shortly.  **If they have any issues connecting, or need assistance please contact MyChart service desk [phone])**  **If using a computer, in order to ensure the best quality for their visit they will need to use either of the following Internet Browsers: [employer] or Google Chrome**  IF USING DOXIMITY or DOXY.ME - The patient will receive a link just prior to their visit by text.     FULL LENGTH CONSENT FOR TELE-HEALTH VISIT   I hereby voluntarily request, consent and authorize [facility] and its employed or contracted physicians, physician assistants, nurse practitioners or other licensed health care professionals (the Practitioner), to provide me with telemedicine health care services (the "Services") as deemed necessary by the treating Practitioner. I acknowledge and consent to receive the Services by the Practitioner via telemedicine. I understand that the telemedicine visit will involve communicating with the Practitioner through live audiovisual communication technology and the disclosure of certain medical information by electronic transmission. I acknowledge that I have been given the opportunity to request an in-person assessment or other available alternative prior to the telemedicine visit and am voluntarily participating in the telemedicine visit.  I understand that I have the right to withhold or withdraw my consent to the use of telemedicine in the course of my care at any time, without affecting my right to future care or treatment,  and that the Practitioner or I may terminate the telemedicine visit at any time. I understand that I have the right to inspect all information obtained and/or recorded in the course of the telemedicine visit and may receive copies of available information for a reasonable fee.  I understand that some of the potential risks of receiving the Services via telemedicine include:  Marland Kitchen Delay or interruption in medical evaluation due to technological equipment failure or disruption; . Information transmitted may not be sufficient (e.g. poor resolution of images) to allow for appropriate medical decision making by the Practitioner; and/or  . In rare instances, security protocols could fail, causing a breach of personal health information.  Furthermore, I acknowledge that it is my responsibility to provide information about my medical history, conditions and care that is complete and accurate to the best of my ability. I acknowledge that Practitioner's advice, recommendations, and/or decision may be based on factors not within their control, such as incomplete or inaccurate data provided by me or distortions of diagnostic images or specimens that may result from electronic transmissions. I understand that  the practice of medicine is not an exact science and that Practitioner makes no warranties or guarantees regarding treatment outcomes. I acknowledge that I will receive a copy of this consent concurrently upon execution via email to the email address I last provided but may also request a printed copy by calling the office of [place].    I understand that my insurance will be billed for this visit.   I have read or had this consent read to me. . I understand the contents of this consent, which adequately explains the benefits and risks of the Services being provided via telemedicine.  . I have been provided ample opportunity to ask questions regarding this consent and the Services and have had my questions  answered to my satisfaction. . I give my informed consent for the services to be provided through the use of telemedicine in my medical care  By participating in this telemedicine visit I agree to the above.

## 2019-07-30 NOTE — Telephone Encounter (Signed)
Contacted patient to discuss AVS Instructions. Gave patient Dr Delphina Cahill recommendations from today's virtual office visit. Follow up appt with Dr Margaretann Loveless scheduled. Patient voiced understanding and AVS mailed.

## 2019-08-26 ENCOUNTER — Telehealth: Payer: Self-pay | Admitting: *Deleted

## 2019-08-26 ENCOUNTER — Telehealth (INDEPENDENT_AMBULATORY_CARE_PROVIDER_SITE_OTHER): Payer: Medicare HMO | Admitting: Internal Medicine

## 2019-08-26 ENCOUNTER — Encounter: Payer: Self-pay | Admitting: Internal Medicine

## 2019-08-26 VITALS — BP 112/82 | HR 68 | Ht 62.5 in | Wt 166.0 lb

## 2019-08-26 DIAGNOSIS — E781 Pure hyperglyceridemia: Secondary | ICD-10-CM | POA: Diagnosis not present

## 2019-08-26 DIAGNOSIS — R0600 Dyspnea, unspecified: Secondary | ICD-10-CM

## 2019-08-26 DIAGNOSIS — I2510 Atherosclerotic heart disease of native coronary artery without angina pectoris: Secondary | ICD-10-CM | POA: Diagnosis not present

## 2019-08-26 DIAGNOSIS — Z951 Presence of aortocoronary bypass graft: Secondary | ICD-10-CM | POA: Diagnosis not present

## 2019-08-26 DIAGNOSIS — R0609 Other forms of dyspnea: Secondary | ICD-10-CM

## 2019-08-26 NOTE — Progress Notes (Signed)
Virtual Visit via Video Note   This visit type was conducted due to national recommendations for restrictions regarding the COVID-19 Pandemic (e.g. social distancing) in an effort to limit this patient's exposure and mitigate transmission in our community.  Due to her co-morbid illnesses, this patient is at least at moderate risk for complications without adequate follow up.  This format is felt to be most appropriate for this patient at this time.  All issues noted in this document were discussed and addressed.  A limited physical exam was performed with this format.  Please refer to the patient's chart for her consent to telehealth for [facility].   Date:  08/26/2019   ID:  Jaime Chavez, DOB 05/07/1941, MRN [id number]  Patient Location: Home Provider Location: Home  PCP:  Shirlean Mylar, MD  Cardiologist:  Parke Poisson, MD  Electrophysiologist:  None   Evaluation Performed:  Follow-Up Visit  Chief Complaint:  F/u DOE; CAD S/P CABG  History of Present Illness:    Jaime Chavez is a 78 y.o. female with unstable angina s/p CABG x 4 June 14, 2018. She has a history of GERD, HLD.  Today's visit is to follow up on her symptoms of DOE from her last appointment 07/30/2019.   We increased her metoprolol to 50 mg BID and she feels Chavez. Exercise tolerance has improved.   She continues to experience right breast pain which she says is longstanding. She will plan to schedule her mammogram.   We discussed hypertriglyceridemia today and dietary causes.  The patient does not have symptoms concerning for COVID-19 infection (fever, chills, cough, or new shortness of breath).    Past Medical History:  Diagnosis Date  . Acid reflux   . Coronary artery disease involving native coronary artery of native heart with angina pectoris (HCC) 06/13/2018  . Dyspnea   . Environmental allergies   . Hyperlipidemia 2014  . Left patella fracture 2005  . S/P CABG x 4 06/14/2018   LIMA to  LAD, SVG to OM, Sequential SVG to RCA and PDA, EVH from bilateral thighs and RLL   Past Surgical History:  Procedure Laterality Date  . CARDIAC CATHETERIZATION  06/13/2018  . CORONARY ARTERY BYPASS GRAFT N/A 06/14/2018   Procedure: CORONARY ARTERY BYPASS GRAFTING (CABG) times 4 using left internal mammary artery and right saphenous vein using endoscope.;  Surgeon: [name], MD;  Location: [facility] OR;  Service: Open Heart Surgery;  Laterality: N/A;  . DILATION AND CURETTAGE, DIAGNOSTIC / THERAPEUTIC     for nonmalignant polyp  . RIGHT/LEFT HEART CATH AND CORONARY ANGIOGRAPHY N/A 06/13/2018   Procedure: RIGHT/LEFT HEART CATH AND CORONARY ANGIOGRAPHY;  Surgeon: [name], MD;  Location: [facility] INVASIVE CV LAB;  Service: Cardiovascular;  Laterality: N/A;  . TEE WITHOUT CARDIOVERSION N/A 06/14/2018   Procedure: TRANSESOPHAGEAL ECHOCARDIOGRAM (TEE);  Surgeon: [name], MD;  Location: [facility];  Service: Open Heart Surgery;  Laterality: N/A;     Current Meds  Medication Sig  . acetaminophen (TYLENOL) 325 MG tablet Take 650 mg by mouth every 6 (six) hours as needed.  Marland Kitchen aspirin EC 81 MG tablet Take 1 tablet (81 mg total) by mouth daily.  . calcium carbonate (CALCIUM 600) 600 MG TABS tablet Take 600 mg by mouth 2 (two) times daily with a meal.  . cetirizine (ALLERGY 24HOUR INDOOR/OUTDOOR) 10 MG tablet Take 10 mg by mouth daily.  . fluticasone (FLONASE) 50 MCG/ACT nasal spray Place into both nostrils as needed.   Marland Kitchen  Glucosamine-Chondroit-Vit C-Mn (GLUCOSAMINE CHONDR 1500 COMPLX) CAPS Take 1 capsule by mouth 2 (two) times daily.  . isosorbide mononitrate (IMDUR) 30 MG 24 hr tablet Take 1 tablet (30 mg total) by mouth daily.  . metoprolol tartrate (LOPRESSOR) 50 MG tablet Take 1 tablet (50 mg total) by mouth 2 (two) times daily.  . Multiple Vitamins-Minerals (CENTRUM SILVER 50+WOMEN) TABS Take 1 tablet by mouth daily.  . nitroGLYCERIN (NITROSTAT) 0.4 MG SL tablet Place 1 tablet (0.4 mg total)  under the tongue every 5 (five) minutes as needed for chest pain.  . rosuvastatin (CRESTOR) 20 MG tablet Take 1 tablet (20 mg total) by mouth daily.     Allergies:   Osteoporosis support [a-g pro] and Alendronate sodium   Social History   Tobacco Use  . Smoking status: Never Smoker  . Smokeless tobacco: Never Used  Substance Use Topics  . Alcohol use: Yes    Comment: wine and liquor, about 1 drink per day  . Drug use: Never     Family Hx: The patient's family history includes Breast cancer in her paternal aunt; Colon cancer in her sister; Heart attack in her father; Other in her father; Pancreatic cancer in her mother.  ROS:   Please see the history of present illness.     All other systems reviewed and are negative.   Prior CV studies:   The following studies were reviewed today:  LIPID PANEL  Labs/Other Tests and Data Reviewed:    EKG:  No ECG reviewed.  Recent Labs: No results found for requested labs within last 8760 hours.   Recent Lipid Panel Lab Results  Component Value Date/Time   CHOL 151 10/17/2018 09:01 AM   TRIG 161 (H) 10/17/2018 09:01 AM   HDL 60 10/17/2018 09:01 AM   CHOLHDL 2.5 10/17/2018 09:01 AM   LDLCALC 59 10/17/2018 09:01 AM    Wt Readings from Last 3 Encounters:  08/26/19 166 lb (75.3 kg)  07/30/19 164 lb (74.4 kg)  01/16/19 158 lb (71.7 kg)     Objective:    Vital Signs:  BP 112/82   Pulse 68   Ht 5' 2.5" (1.588 m)   Wt 166 lb (75.3 kg)   BMI 29.88 kg/m    VITAL SIGNS:  reviewed GEN:  no acute distress EYES:  sclerae anicteric, EOMI - Extraocular Movements Intact RESPIRATORY:  normal respiratory effort, symmetric expansion CARDIOVASCULAR:  unable to visualize SKIN:  no rash, lesions or ulcers. MUSCULOSKELETAL:  no obvious deformities. NEURO:  alert and oriented x 3, no obvious focal deficit PSYCH:  normal affect  ASSESSMENT & PLAN:    1. Dyspnea on exertion   2. Coronary artery disease involving native coronary artery  of native heart without angina pectoris   3. S/P CABG x 4   4. Hypertriglyceridemia    DOE- improved with uptitration of metoprolol. Continue 50 mg BID.   CAD - continue medical therapy, no chest pain  Hypertriglyceridemia - discussed dietary modifications. On Crestor 20 mg daily. She wants labs with her PCP at next visit.  F/u in 6 mo   COVID-19 Education: The signs and symptoms of COVID-19 were discussed with the patient and how to seek care for testing (follow up with PCP or arrange E-visit).  The importance of social distancing was discussed today.  Time:   Today, I have spent 13 minutes with the patient with telehealth technology discussing the above problems.     Medication Adjustments/Labs and Tests Ordered: Current medicines are  reviewed at length with the patient today.  Concerns regarding medicines are outlined above.   Tests Ordered: No orders of the defined types were placed in this encounter.   Medication Changes: No orders of the defined types were placed in this encounter.   Follow Up:  Either In Person or Virtual in 6 month(s)  Signed, Parke Poisson, MD  08/26/2019 9:38 AM    [facility]

## 2019-08-26 NOTE — Patient Instructions (Addendum)
Medication Instructions:  No changes, continue metoprolol 50 mg BID  *If you need a refill on your cardiac medications before your next appointment, please call your pharmacy*  Lab Work: Not needed  Testing/Procedures: Not needed  Follow-Up: At [facility], you and your health needs are our priority.  As part of our continuing mission to provide you with exceptional heart care, we have created designated Provider Care Teams.  These Care Teams include your primary Cardiologist (physician) and Advanced Practice Providers (APPs -  Physician Assistants and Nurse Practitioners) who all work together to provide you with the care you need, when you need it.  Your next appointment:   6 month(s)  The format for your next appointment:   Either In Person or Virtual  Provider:   Weston Brass, MD

## 2019-08-26 NOTE — Telephone Encounter (Signed)
RN spoke t opatient. Instruction were given  from today's virtual visit 08/26/19 .  AVS SUMMARY has been sent by mychart .   Patient verbalized understanding. 

## 2019-10-01 ENCOUNTER — Other Ambulatory Visit: Payer: Self-pay | Admitting: Internal Medicine

## 2019-10-13 ENCOUNTER — Telehealth: Payer: Self-pay

## 2019-10-13 MED ORDER — ISOSORBIDE MONONITRATE ER 30 MG PO TB24: 30.0000 mg | ORAL_TABLET | Freq: Every day | ORAL | 3 refills | Status: DC

## 2019-10-13 NOTE — Telephone Encounter (Signed)
Refilled Rx per Dr.Acharya

## 2019-11-25 ENCOUNTER — Other Ambulatory Visit: Payer: Self-pay | Admitting: Internal Medicine

## 2019-11-25 DIAGNOSIS — E785 Hyperlipidemia, unspecified: Secondary | ICD-10-CM

## 2019-11-28 ENCOUNTER — Other Ambulatory Visit: Payer: Self-pay

## 2019-11-28 DIAGNOSIS — E785 Hyperlipidemia, unspecified: Secondary | ICD-10-CM

## 2019-11-28 MED ORDER — ROSUVASTATIN CALCIUM 20 MG PO TABS: 20.0000 mg | ORAL_TABLET | Freq: Every day | ORAL | 3 refills | Status: DC

## 2020-02-25 ENCOUNTER — Encounter: Payer: Self-pay | Admitting: Internal Medicine

## 2020-02-25 ENCOUNTER — Ambulatory Visit: Payer: Medicare HMO | Admitting: Internal Medicine

## 2020-02-25 ENCOUNTER — Other Ambulatory Visit: Payer: Self-pay

## 2020-02-25 VITALS — BP 154/80 | HR 69 | Ht 62.0 in | Wt 172.0 lb

## 2020-02-25 DIAGNOSIS — R0600 Dyspnea, unspecified: Secondary | ICD-10-CM | POA: Diagnosis not present

## 2020-02-25 DIAGNOSIS — I2510 Atherosclerotic heart disease of native coronary artery without angina pectoris: Secondary | ICD-10-CM

## 2020-02-25 DIAGNOSIS — E781 Pure hyperglyceridemia: Secondary | ICD-10-CM

## 2020-02-25 DIAGNOSIS — Z951 Presence of aortocoronary bypass graft: Secondary | ICD-10-CM

## 2020-02-25 DIAGNOSIS — R0609 Other forms of dyspnea: Secondary | ICD-10-CM

## 2020-02-25 NOTE — Patient Instructions (Signed)
Medication Instructions:  NO CHANGES *If you need a refill on your cardiac medications before your next appointment, please call your pharmacy*   Lab Work: NOT NEEDED   Testing/Procedures: WILL BE SCHEDULE AT [address]  Your physician has requested that you have an echocardiogram. Echocardiography is a painless test that uses sound waves to create images of your heart. It provides your doctor with information about the size and shape of your heart and how well your heart's chambers and valves are working. This procedure takes approximately one hour. There are no restrictions for this procedure.     Follow-Up: At [facility], you and your health needs are our priority.  As part of our continuing mission to provide you with exceptional heart care, we have created designated Provider Care Teams.  These Care Teams include your primary Cardiologist (physician) and Advanced Practice Providers (APPs -  Physician Assistants and Nurse Practitioners) who all work together to provide you with the care you need, when you need it.    Your next appointment:   1 month(s)  The format for your next appointment:   Virtual Visit   Provider:   Weston Brass, MD   Other Instructions You have been referred to  PULMONOLOGIST AT [facility]

## 2020-02-25 NOTE — Progress Notes (Signed)
Cardiology Office Note:    Date:  02/25/2020   ID:  Jaime Chavez, DOB [birth year], MRN [id number]  PCP:  Shirlean Mylar, MD  Cardiologist:  Parke Poisson, MD  Electrophysiologist:  None   Referring MD: Shirlean Mylar, MD   Chief Complaint: DOE, CAD S/P CABG  History of Present Illness:    Jaime Chavez is a 79 y.o. female with a history of unstable angina s/p CABG x 4 June 14, 2018. She has a history of GERD, HLD.    She continues to struggle with shortness of breath on exertion.  Her husband tells me that she is short of breath with any significant activity.  They recently took a trip to [place] and she notes after a long day of walking all over the city she had significant shortness of breath and had to rest.  She took the next day off.  The next day she noticed that her heart rate on her smart watch was somewhat irregular but it did not cause her symptoms of palpitations or lightheadedness.  She also continues to be bothered by lethargy after going back on metoprolol.  She had stopped this medication for quite some time and feels her energy level was slightly Chavez, but after she noticed the irregular heartbeats on her watch, she went back on her metoprolol.  Since then she notes decreased energy level.  We participated in shared decision-making and deliberated thoroughly on the benefits of continuing this medication in the setting of prior CABG and lifestyle limitation due to symptoms from beta-blockade.  She denies chest discomfort, PND, orthopnea, leg swelling.  Denies syncope or presyncope.  Her husband notes that she has had abnormal pulmonary studies in the past, and someone has told her she has "the lungs of a 79 year old".  She has not been evaluated by pulmonary medicine.  Past Medical History:  Diagnosis Date  . Acid reflux   . Coronary artery disease involving native coronary artery of native heart with angina pectoris (HCC) 06/13/2018  . Dyspnea   .  Environmental allergies   . Hyperlipidemia 2014  . Left patella fracture 2005  . S/P CABG x 4 06/14/2018   LIMA to LAD, SVG to OM, Sequential SVG to RCA and PDA, EVH from bilateral thighs and RLL    Past Surgical History:  Procedure Laterality Date  . CARDIAC CATHETERIZATION  06/13/2018  . CORONARY ARTERY BYPASS GRAFT N/A 06/14/2018   Procedure: CORONARY ARTERY BYPASS GRAFTING (CABG) times 4 using left internal mammary artery and right saphenous vein using endoscope.;  Surgeon: [name], MD;  Location: [facility] OR;  Service: Open Heart Surgery;  Laterality: N/A;  . DILATION AND CURETTAGE, DIAGNOSTIC / THERAPEUTIC     for nonmalignant polyp  . RIGHT/LEFT HEART CATH AND CORONARY ANGIOGRAPHY N/A 06/13/2018   Procedure: RIGHT/LEFT HEART CATH AND CORONARY ANGIOGRAPHY;  Surgeon: [name], MD;  Location: [facility] INVASIVE CV LAB;  Service: Cardiovascular;  Laterality: N/A;  . TEE WITHOUT CARDIOVERSION N/A 06/14/2018   Procedure: TRANSESOPHAGEAL ECHOCARDIOGRAM (TEE);  Surgeon: [name], MD;  Location[facility] OR;  Service: Open Heart Surgery;  Laterality: N/A;    Current Medications: Current Meds  Medication Sig  . acetaminophen (TYLENOL) 325 MG tablet Take 650 mg by mouth every 6 (six) hours as needed.  Marland Kitchen aspirin EC 81 MG tablet Take 1 tablet (81 mg total) by mouth daily.  . calcium carbonate (CALCIUM 600) 600 MG TABS tablet Take 600 mg by mouth 2 (two) times daily with  a meal.  . cetirizine (ALLERGY 24HOUR INDOOR/OUTDOOR) 10 MG tablet Take 10 mg by mouth daily.  . fluticasone (FLONASE) 50 MCG/ACT nasal spray Place into both nostrils as needed.   . Glucosamine-Chondroit-Vit C-Mn (GLUCOSAMINE CHONDR 1500 COMPLX) CAPS Take 1 capsule by mouth 2 (two) times daily.  . metoprolol tartrate (LOPRESSOR) 50 MG tablet Take 1 tablet (50 mg total) by mouth 2 (two) times daily.  . Multiple Vitamins-Minerals (CENTRUM SILVER 50+WOMEN) TABS Take 1 tablet by mouth daily.  . rosuvastatin (CRESTOR) 20 MG  tablet Take 1 tablet (20 mg total) by mouth daily.     Allergies:   Osteoporosis support [a-g pro] and Alendronate sodium   Social History   Socioeconomic History  . Marital status: Married    Spouse name: Not on file  . Number of children: Not on file  . Years of education: high school graduate  . Highest education level: Not on file  Occupational History  . Occupation: retired  Tobacco Use  . Smoking status: Never Smoker  . Smokeless tobacco: Never Used  Vaping Use  . Vaping Use: Never used  Substance and Sexual Activity  . Alcohol use: Yes    Comment: wine and liquor, about 1 drink per day  . Drug use: Never  . Sexual activity: Not on file  Other Topics Concern  . Not on file  Social History Narrative   Lives with husband Reggie and dog named Hachi.   Religious or personal believes: [employer]ri[employer] Knuckles[employer]   Has an advance directive, would want her husband to make medical decisions for her if she were unable to do so.   Does not exercise regularly.  Does walk sometimes.   For fun she likes to eat out, travel, spent time with family.   Social Determinants of Health   Financial Resource Strain:   . Difficulty of Paying Living Expenses:   Food Insecurity:   . Worried About Programme researcher, broadcasting/film/videounning Out of Food in the Last Year:   . Baristaan Out of Food in the Last Year:   Transportation Needs:   . Freight forwarderLack of Transportation (Medical):   Marland Kitchen. Lack of Transportation (Non-Medical):   Physical Activity:   . Days of Exercise per Week:   . Minutes of Exercise per Session:   Stress:   . Feeling of Stress :   Social Connections:   . Frequency of Communication with Friends and Family:   . Frequency of Social Gatherings with Friends and Family:   . Attends Religious Services:   . Active Member of Clubs or Organizations:   . Attends BankerClub or Organization Meetings:   Marland Kitchen. Marital Status:      Family History: The patient's family history includes Breast cancer in her paternal aunt; Colon cancer in her sister; Heart  attack in her father; Other in her father; Pancreatic cancer in her mother.  ROS:   Please see the history of present illness.    All other systems reviewed and are negative.  EKGs/Labs/Other Studies Reviewed:    The following studies were reviewed today:  EKG: Normal sinus rhythm, nonspecific ST-T wave abnormality  Recent Labs: No results found for requested labs within last 8760 hours.  Recent Lipid Panel    Component Value Date/Time   CHOL 151 10/17/2018 0901   TRIG 161 (H) 10/17/2018 0901   HDL 60 10/17/2018 0901   CHOLHDL 2.5 10/17/2018 0901   LDLCALC 59 10/17/2018 0901    Physical Exam:    VS:  BP (!) 154/80 (BP Location:  Right Arm, Patient Position: Sitting, Cuff Size: Normal)   Pulse 69   Ht 5\' 2"  (1.575 m)   Wt 172 lb (78 kg)   SpO2 96%   BMI 31.46 kg/m     Wt Readings from Last 5 Encounters:  02/25/20 172 lb (78 kg)  08/26/19 166 lb (75.3 kg)  07/30/19 164 lb (74.4 kg)  01/16/19 158 lb (71.7 kg)  10/15/18 158 lb 9.6 oz (71.9 kg)     Constitutional: No acute distress Eyes: sclera non-icteric, normal conjunctiva and lids ENMT: Mask in place Cardiovascular: regular rhythm, normal rate, no murmurs. S1 and S2 normal. Radial pulses normal bilaterally. No jugular venous distention.  Respiratory: clear to auscultation bilaterally GI : normal bowel sounds, soft and nontender. No distention.   MSK: extremities warm, well perfused. No edema.  NEURO: grossly nonfocal exam, moves all extremities. PSYCH: alert and oriented x 3, normal mood and affect.   ASSESSMENT:    1. S/P CABG x 4   2. Dyspnea on exertion   3. Coronary artery disease involving native coronary artery of native heart without angina pectoris   4. Hypertriglyceridemia    PLAN:    S/P CABG x 4 Dyspnea on exertion - Plan: EKG 12-Lead, ECHOCARDIOGRAM COMPLETE, Ambulatory referral to Pulmonology  She notes dyspnea on exertion.  We discussed options for evaluating shortness of breath in the  setting of CAD including stress testing, echocardiogram, and close observation.  We have participated in shared decision making and determined we will proceed with echocardiogram and determine next steps after that.  She denies chest pain and feels much Chavez after bypass surgery, I I am not strongly suspicious of ischemia, however if her dyspnea continues this may be an anginal equivalent and should be worked up with ischemic testing if needed.  She has struggled with dyspnea since undergoing bypass surgery.  In the absence of structural cardiovascular issues which we will evaluate with echocardiogram, there may be an extracardiac cause of her dyspnea and this should be evaluated by pulmonary medicine.  We will make the referral.  She has decided to continue taking metoprolol 50 mg twice daily.  At times she will not take the evening dose.  We discussed inconsistent use of metoprolol, and she will try to take it as often as she can unless symptoms are prohibitive.  Otherwise continue aspirin 81 mg daily isosorbide mononitrate 30 mg daily, rosuvastatin 20 mg daily.  Hypertriglyceridemia-continue rosuvastatin 20 mg daily, continue to address dietary and lifestyle factors that impact triglycerides.  Total time of encounter: 30 minutes total time of encounter, including 25 minutes spent in face-to-face patient care on the date of this encounter. This time includes coordination of care and counseling regarding above mentioned problem list. Remainder of non-face-to-face time involved reviewing chart documents/testing relevant to the patient encounter and documentation in the medical record. I have independently reviewed documentation from referring provider.   10/17/18, MD [place]  [facility]    Medication Adjustments/Labs and Tests Ordered: Current medicines are reviewed at length with the patient today.  Concerns regarding medicines are outlined above.  Orders Placed This Encounter   Procedures  . Ambulatory referral to Pulmonology  . EKG 12-Lead  . ECHOCARDIOGRAM COMPLETE   No orders of the defined types were placed in this encounter.   Patient Instructions  Medication Instructions:  NO CHANGES *If you need a refill on your cardiac medications before your next appointment, please call your pharmacy*   Lab Work: NOT  NEEDED   Testing/Procedures: WILL BE SCHEDULE AT [address]  Your physician has requested that you have an echocardiogram. Echocardiography is a painless test that uses sound waves to create images of your heart. It provides your doctor with information about the size and shape of your heart and how well your heart's chambers and valves are working. This procedure takes approximately one hour. There are no restrictions for this procedure.     Follow-Up: At [facility], you and your health needs are our priority.  As part of our continuing mission to provide you with exceptional heart care, we have created designated Provider Care Teams.  These Care Teams include your primary Cardiologist (physician) and Advanced Practice Providers (APPs -  Physician Assistants and Nurse Practitioners) who all work together to provide you with the care you need, when you need it.    Your next appointment:   1 month(s)  The format for your next appointment:   Virtual Visit   Provider:   Weston Brass, MD   Other Instructions You have been referred to  PULMONOLOGIST AT [facility]

## 2020-03-02 ENCOUNTER — Other Ambulatory Visit: Payer: Self-pay

## 2020-03-02 ENCOUNTER — Ambulatory Visit (HOSPITAL_COMMUNITY): Payer: Medicare HMO | Attending: Cardiology

## 2020-03-02 DIAGNOSIS — R0600 Dyspnea, unspecified: Secondary | ICD-10-CM

## 2020-03-02 DIAGNOSIS — Z951 Presence of aortocoronary bypass graft: Secondary | ICD-10-CM | POA: Insufficient documentation

## 2020-03-02 DIAGNOSIS — R0609 Other forms of dyspnea: Secondary | ICD-10-CM

## 2020-03-03 ENCOUNTER — Telehealth: Payer: Self-pay | Admitting: *Deleted

## 2020-03-03 NOTE — Telephone Encounter (Signed)
-----   Message from Parke Poisson, MD sent at 03/02/2020  2:56 PM EDT ----- Echo is normal, no concern for worsening LV systolic or diastolic function contributing to SOB.

## 2020-03-03 NOTE — Telephone Encounter (Signed)
Patient reviewed via mychart

## 2020-03-03 NOTE — Telephone Encounter (Signed)
RELEASE TO MYCHART, LEFT DETAIL  ECHO RESULTS MESSAGE ON SECURE VOICEMAIL ANY QUESTION MAY CALL BACK

## 2020-04-05 ENCOUNTER — Other Ambulatory Visit: Payer: Self-pay

## 2020-04-05 MED ORDER — METOPROLOL TARTRATE 50 MG PO TABS: 50.0000 mg | ORAL_TABLET | Freq: Two times a day (BID) | ORAL | 1 refills | Status: DC

## 2020-04-08 ENCOUNTER — Telehealth: Payer: Medicare HMO | Admitting: Internal Medicine

## 2020-04-13 NOTE — Progress Notes (Signed)
Virtual Visit via Telephone Note   This visit type was conducted due to national recommendations for restrictions regarding the COVID-19 Pandemic (e.g. social distancing) in an effort to limit this patient's exposure and mitigate transmission in our community.  Due to her co-morbid illnesses, this patient is at least at moderate risk for complications without adequate follow up.  This format is felt to be most appropriate for this patient at this time.  The patient did not have access to video technology/had technical difficulties with video requiring transitioning to audio format only (telephone).  All issues noted in this document were discussed and addressed.  No physical exam could be performed with this format.  Please refer to the patient's chart for her  consent to telehealth for [facility].    Date:  04/14/2020   ID:  Jaime Chavez, DOB 10/03/1940, MRN [id number] The patient was identified using 2 identifiers.  Patient Location: Home Provider Location: Home Office  PCP:  Shirlean Mylar, MD  Cardiologist:  Parke Poisson, MD  Electrophysiologist:  None   Evaluation Performed:  Follow-Up Visit  Chief Complaint:  DOE  History of Present Illness:    Jaime Chavez is a 79 y.o. female with hx of CABG, well known to my clinic.   Echo was grossly normal, no sign of abnormalities contributing to SOB. Diastolic function grade 1 pattern.   Pulmonary evaluation upcoming.   She feels her SOB is normal for her, has not exerted herself the ways she did in [place].   Taking scheduled metoprolol and tolerating well.   Was exposed to granddaughter who had COVID19 infection. Her and her husband did not have symptoms and quarantined. Has not yet been vaccinated for COVID 19 or the flu. We discussed her specific risks if she were to contract these viruses.   The patient does not have symptoms concerning for COVID-19 infection (fever, chills, cough, or new shortness of breath).     Past Medical History:  Diagnosis Date  . Acid reflux   . Coronary artery disease involving native coronary artery of native heart with angina pectoris (HCC) 06/13/2018  . Dyspnea   . Environmental allergies   . Hyperlipidemia 2014  . Left patella fracture 2005  . 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 CARDIAC CATHETERIZATION  06/13/2018  . CORONARY ARTERY BYPASS GRAFT N/A 06/14/2018   Procedure: CORONARY ARTERY BYPASS GRAFTING (CABG) times 4 using left internal mammary artery and right saphenous vein using endoscope.;  Surgeon: [name], MD;  Location: [facility] OR;  Service: Open Heart Surgery;  Laterality: N/A;  . DILATION AND CURETTAGE, DIAGNOSTIC / THERAPEUTIC     for nonmalignant polyp  . RIGHT/LEFT HEART CATH AND CORONARY ANGIOGRAPHY N/A 06/13/2018   Procedure: RIGHT/LEFT HEART CATH AND CORONARY ANGIOGRAPHY;  Surgeon: [name], MD;  Location: [facility] INVASIVE CV LAB;  Service: Cardiovascular;  Laterality: N/A;  . TEE WITHOUT CARDIOVERSION N/A 06/14/2018   Procedure: TRANSESOPHAGEAL ECHOCARDIOGRAM (TEE);  Surgeon: [name], MD;  Location: [facility] OR;  Service: Open Heart Surgery;  Laterality: N/A;     Current Meds  Medication Sig  . acetaminophen (TYLENOL) 325 MG tablet Take 650 mg by mouth every 6 (six) hours as needed.  Marland Kitchen aspirin EC 81 MG tablet Take 1 tablet (81 mg total) by mouth daily.  . calcium carbonate (  CALCIUM 600) 600 MG TABS tablet Take 600 mg by mouth 2 (two) times daily with a meal.  . cetirizine (ALLERGY 24HOUR INDOOR/OUTDOOR) 10 MG tablet Take 10 mg by mouth daily.  . fluticasone (FLONASE) 50 MCG/ACT nasal spray Place into both nostrils as needed.   . Glucosamine-Chondroit-Vit C-Mn (GLUCOSAMINE CHONDR 1500 COMPLX) CAPS Take 1 capsule by mouth 2 (two) times daily.  . isosorbide mononitrate (IMDUR) 30 MG 24 hr tablet Take 1  tablet (30 mg total) by mouth daily.  . metoprolol tartrate (LOPRESSOR) 50 MG tablet Take 1 tablet (50 mg total) by mouth 2 (two) times daily.  . Multiple Vitamins-Minerals (CENTRUM SILVER 50+WOMEN) TABS Take 1 tablet by mouth daily.  . nitroGLYCERIN (NITROSTAT) 0.4 MG SL tablet Place 1 tablet (0.4 mg total) under the tongue every 5 (five) minutes as needed for chest pain.  . rosuvastatin (CRESTOR) 20 MG tablet Take 1 tablet (20 mg total) by mouth daily.     Allergies:   Osteoporosis support [a-g pro] and Alendronate sodium   Social History   Tobacco Use  . Smoking status: Never Smoker  . Smokeless tobacco: Never Used  Vaping Use  . Vaping Use: Never used  Substance Use Topics  . Alcohol use: Yes    Comment: wine and liquor, about 1 drink per day  . Drug use: Never     Family Hx: The patient's family history includes Breast cancer in her paternal aunt; Colon cancer in her sister; Heart attack in her father; Other in her father; Pancreatic cancer in her mother.  ROS:   Please see the history of present illness.     All other systems reviewed and are negative.   Prior CV studies:   The following studies were reviewed today:    Labs/Other Tests and Data Reviewed:    EKG:  No ECG reviewed.  Recent Labs: No results found for requested labs within last 8760 hours.   Recent Lipid Panel Lab Results  Component Value Date/Time   CHOL 151 10/17/2018 09:01 AM   TRIG 161 (H) 10/17/2018 09:01 AM   HDL 60 10/17/2018 09:01 AM   CHOLHDL 2.5 10/17/2018 09:01 AM   LDLCALC 59 10/17/2018 09:01 AM    Wt Readings from Last 3 Encounters:  04/14/20 167 lb (75.8 kg)  02/25/20 172 lb (78 kg)  08/26/19 166 lb (75.3 kg)     Objective:    Vital Signs:  BP (!) 149/88   Pulse 67   Ht 5\' 2"  (1.575 m)   Wt 167 lb (75.8 kg)   BMI 30.54 kg/m    VITAL SIGNS:  reviewed GEN:  no acute distress RESPIRATORY:  normal respiratory effort, no increased work of breathing NEURO:  alert and  oriented x 3, speech normal PSYCH:  normal affect   ASSESSMENT & PLAN:    1. S/P CABG x 4   2. Dyspnea on exertion   3. Advice given about 2019 novel coronavirus infection    Currently, we have no identified a cardiovascular cause of dyspnea. Pulmonary referral upcoming.  Continue current medical therapy.   I have recommended to her to get the COVID 19 vaccine as well as the flu shot since she has a history of cardiovascular disease which puts her in a high risk category. She and her husband will think about it. Leaning toward 2020 given 1 injection.   COVID-19 Education: The signs and symptoms of COVID-19 were discussed with the patient and how to seek care for  testing (follow up with PCP or arrange E-visit).  The importance of social distancing was discussed today.  Time:   Today, I have spent 10 minutes with the patient with telehealth technology discussing the above problems.     Medication Adjustments/Labs and Tests Ordered: Current medicines are reviewed at length with the patient today.  Concerns regarding medicines are outlined above.   Tests Ordered: No orders of the defined types were placed in this encounter.   Medication Changes: No orders of the defined types were placed in this encounter.   Follow Up:  6 mo in office  Signed, Parke Poisson, MD  04/14/2020 8:18 AM     [facility]

## 2020-04-14 ENCOUNTER — Telehealth (INDEPENDENT_AMBULATORY_CARE_PROVIDER_SITE_OTHER): Payer: Medicare HMO | Admitting: Internal Medicine

## 2020-04-14 VITALS — BP 149/88 | HR 67 | Ht 62.0 in | Wt 167.0 lb

## 2020-04-14 DIAGNOSIS — Z7189 Other specified counseling: Secondary | ICD-10-CM | POA: Diagnosis not present

## 2020-04-14 DIAGNOSIS — R0609 Other forms of dyspnea: Secondary | ICD-10-CM

## 2020-04-14 DIAGNOSIS — R0600 Dyspnea, unspecified: Secondary | ICD-10-CM | POA: Diagnosis not present

## 2020-04-14 DIAGNOSIS — Z951 Presence of aortocoronary bypass graft: Secondary | ICD-10-CM | POA: Diagnosis not present

## 2020-04-14 NOTE — Patient Instructions (Signed)
Medication Instructions:  Your Physician recommend you continue on your current medication as directed.   *If you need a refill on your cardiac medications before your next appointment, please call your pharmacy*   Lab Work: None    Testing/Procedures: None   Follow-Up: At [facility], you and your health needs are our priority.  As part of our continuing mission to provide you with exceptional heart care, we have created designated Provider Care Teams.  These Care Teams include your primary Cardiologist (physician) and Advanced Practice Providers (APPs -  Physician Assistants and Nurse Practitioners) who all work together to provide you with the care you need, when you need it.  We recommend signing up for the patient portal called "MyChart".  Sign up information is provided on this After Visit Summary.  MyChart is used to connect with patients for Virtual Visits (Telemedicine).  Patients are able to view lab/test results, encounter notes, upcoming appointments, etc.  Non-urgent messages can be sent to your provider as well.   To learn more about what you can do with MyChart, go to [email or web address].    Your next appointment:   6 month(s)  The format for your next appointment:   In Person  Provider:   Gayatri Acharya, MD     

## 2020-04-19 ENCOUNTER — Institutional Professional Consult (permissible substitution): Payer: Medicare HMO | Admitting: Emergency Medicine

## 2020-05-14 ENCOUNTER — Other Ambulatory Visit: Payer: Self-pay

## 2020-05-14 ENCOUNTER — Ambulatory Visit (INDEPENDENT_AMBULATORY_CARE_PROVIDER_SITE_OTHER): Payer: Medicare HMO

## 2020-05-14 ENCOUNTER — Ambulatory Visit: Payer: Medicare HMO | Admitting: Emergency Medicine

## 2020-05-14 ENCOUNTER — Encounter: Payer: Self-pay | Admitting: Emergency Medicine

## 2020-05-14 DIAGNOSIS — R0602 Shortness of breath: Secondary | ICD-10-CM | POA: Diagnosis not present

## 2020-05-14 DIAGNOSIS — J302 Other seasonal allergic rhinitis: Secondary | ICD-10-CM

## 2020-05-14 NOTE — Addendum Note (Signed)
Addended by: Dorisann Frames R on: 05/14/2020 03:16 PM   Modules accepted: Orders

## 2020-05-14 NOTE — Addendum Note (Signed)
Addended by: Dorisann Frames R on: 05/14/2020 02:58 PM   Modules accepted: Orders

## 2020-05-14 NOTE — Assessment & Plan Note (Signed)
Uses cetirizine, fluticasone nasal spray as needed.  Discussed increasing the fluticasone to a maintenance dose to see if this is beneficial.

## 2020-05-14 NOTE — Progress Notes (Signed)
Subjective:    Patient ID: Jaime Chavez, female    DOB: 05/20/1941, 79 y.o.   MRN: [id number]  HPI 79 year old never smoker with a history of CAD and CABG 2019, hyperlipidemia, GERD, allergic rhinitis.  She has longstanding dyspnea that she describes as SOB after walking 2 blocks, feels that she cannot get a deep breath in. Some occasional wheeze when supine. She has clear allergy drainage to her throat - has occasional cough. No chest pain. She did have some improvement in her exercise tolerance after the CABG, but still limited.  On cetrizine, fluticasone nasal spray   CXR 09/02/2018 reviewed by me.   Spirometry done 05/10/2018 reviewed by me, shows evidence for restriction and possible superimposed obstruction, FEV1 69-73% predicted.   Review of Systems As per HPI  Past Medical History:  Diagnosis Date  . Acid reflux   . Coronary artery disease involving native coronary artery of native heart with angina pectoris (HCC) 06/13/2018  . Dyspnea   . Environmental allergies   . Hyperlipidemia 2014  . Left patella fracture 2005  . S/P CABG x 4 06/14/2018   LIMA to LAD, SVG to OM, Sequential SVG to RCA and PDA, EVH from bilateral thighs and RLL     Family History  Problem Relation Age of Onset  . Pancreatic cancer Mother   . Heart attack Father        age 10, had 3 heart attacks from blood clots   . Other Father        clotting disorder  . Colon cancer Sister   . Breast cancer Paternal Aunt      Social History   Socioeconomic History  . Marital status: Married    Spouse name: Not on file  . Number of children: Not on file  . Years of education: high school graduate  . Highest education level: Not on file  Occupational History  . Occupation: retired  Tobacco Use  . Smoking status: Never Smoker  . Smokeless tobacco: Never Used  Vaping Use  . Vaping Use: Never used  Substance and Sexual Activity  . Alcohol use: Yes    Comment: wine and liquor, about 1 drink per day  .  Drug use: Never  . Sexual activity: Not on file  Other Topics Concern  . Not on file  Social History Narrative   Lives with husband Reggie and dog named Hachi.   Religious or personal believes: [employer]   Has an advance directive, would want her husband to make medical decisions for her if she were unable to do so.   Does not exercise regularly.  Does walk sometimes.   For fun she likes to eat out, travel, spent time with family.   Social Determinants of Health   Financial Resource Strain:   . Difficulty of Paying Living Expenses: Not on file  Food Insecurity:   . Worried About Programme researcher, broadcasting/film/video in the Last Year: Not on file  . Ran Out of Food in the Last Year: Not on file  Transportation Needs:   . Lack of Transportation (Medical): Not on file  . Lack of Transportation (Non-Medical): Not on file  Physical Activity:   . Days of Exercise per Week: Not on file  . Minutes of Exercise per Session: Not on file  Stress:   . Feeling of Stress : Not on file  Social Connections:   . Frequency of Communication with Friends and Family: Not on file  . Frequency of Social  Gatherings with Friends and Family: Not on file  . Attends Religious Services: Not on file  . Active Member of Clubs or Organizations: Not on file  . Attends Banker Meetings: Not on file  . Marital Status: Not on file  Intimate Partner Violence:   . Fear of Current or Ex-Partner: Not on file  . Emotionally Abused: Not on file  . Physically Abused: Not on file  . 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 Osteoporosis Support [A-G Pro] Other (See Comments)    Patient that she started having severe pain and back spasms.   . Alendronate Sodium Other (See Comments)    Back pain, muscle spasms     Outpatient Medications Prior to Visit  Medication Sig Dispense Refill  . acetaminophen (TYLENOL) 325 MG tablet  Take 650 mg by mouth every 6 (six) hours as needed.    Marland Kitchen aspirin EC 81 MG tablet Take 1 tablet (81 mg total) by mouth daily. 60 tablet 2  . calcium carbonate (CALCIUM 600) 600 MG TABS tablet Take 600 mg by mouth 2 (two) times daily with a meal.    . cetirizine (ALLERGY 24HOUR INDOOR/OUTDOOR) 10 MG tablet Take 10 mg by mouth daily.    . fluticasone (FLONASE) 50 MCG/ACT nasal spray Place into both nostrils as needed.     . Glucosamine-Chondroit-Vit C-Mn (GLUCOSAMINE CHONDR 1500 COMPLX) CAPS Take 1 capsule by mouth 2 (two) times daily.    . metoprolol tartrate (LOPRESSOR) 50 MG tablet Take 1 tablet (50 mg total) by mouth 2 (two) times daily. 60 tablet 1  . Multiple Vitamins-Minerals (CENTRUM SILVER 50+WOMEN) TABS Take 1 tablet by mouth daily.    . rosuvastatin (CRESTOR) 20 MG tablet Take 1 tablet (20 mg total) by mouth daily. 90 tablet 3  . isosorbide mononitrate (IMDUR) 30 MG 24 hr tablet Take 1 tablet (30 mg total) by mouth daily. 90 tablet 3  . nitroGLYCERIN (NITROSTAT) 0.4 MG SL tablet Place 1 tablet (0.4 mg total) under the tongue every 5 (five) minutes as needed for chest pain. 25 tablet 3   No facility-administered medications prior to visit.         Objective:   Physical Exam  Vitals:   05/14/20 1426  BP: 120/68  Pulse: 71  Temp: (!) 97.4 F (36.3 C)  TempSrc: Temporal  SpO2: 99%  Weight: 173 lb 6.4 oz (78.7 kg)  Height: 5' 2.5" (1.588 m)   Gen: Pleasant, overwt woman, in no distress,  normal affect  ENT: No lesions,  mouth clear,  oropharynx clear, no postnasal drip  Neck: No JVD, no stridor  Lungs: No use of accessory muscles, no crackles or wheezing on normal respiration, no wheeze on forced expiration  Cardiovascular: RRR, heart sounds normal, no murmur or gallops, no peripheral edema  Musculoskeletal: No deformities, no cyanosis or clubbing  Neuro: alert, awake, non focal  Skin: Warm, no lesions or rash      Assessment & Plan:  Shortness of  breath Longstanding shortness of breath, slight improvement after CABG but still distant, still limited.  Possibly component of restriction or deconditioning.  She did have a pleural effusion after the CABG, need to ensure that this has not reaccumulated.  No history of asthma, no tobacco history.  We will obtain pulmonary function testing to rule out obstruction, assess for restriction.  If no  clear cause then cardiopulmonary exercise test would probably be helpful.  Seasonal allergies Uses cetirizine, fluticasone nasal spray as needed.  Discussed increasing the fluticasone to a maintenance dose to see if this is beneficial.  Levy Pupa, MD, PhD 05/14/2020, 2:55 PM [facility] [phone] or if no answer [phone]

## 2020-05-14 NOTE — Assessment & Plan Note (Signed)
Longstanding shortness of breath, slight improvement after CABG but still distant, still limited.  Possibly component of restriction or deconditioning.  She did have a pleural effusion after the CABG, need to ensure that this has not reaccumulated.  No history of asthma, no tobacco history.  We will obtain pulmonary function testing to rule out obstruction, assess for restriction.  If no clear cause then cardiopulmonary exercise test would probably be helpful.

## 2020-05-14 NOTE — Patient Instructions (Signed)
We will perform a CXR today Follow with Dr. Delton Coombes next available with full pulmonary function testing on the same day.

## 2020-06-04 ENCOUNTER — Other Ambulatory Visit (HOSPITAL_COMMUNITY)
Admission: RE | Admit: 2020-06-04 | Discharge: 2020-06-04 | Disposition: A | Discharge status: Home / Self Care | Payer: Medicare HMO | Source: Ambulatory Visit | Attending: Emergency Medicine | Admitting: Emergency Medicine

## 2020-06-04 DIAGNOSIS — Z20822 Contact with and (suspected) exposure to covid-19: Secondary | ICD-10-CM | POA: Diagnosis not present

## 2020-06-04 DIAGNOSIS — Z01812 Encounter for preprocedural laboratory examination: Secondary | ICD-10-CM | POA: Diagnosis present

## 2020-06-04 LAB — SARS CORONAVIRUS 2 (TAT 6-24 HRS): SARS Coronavirus 2: NEGATIVE

## 2020-06-08 ENCOUNTER — Encounter: Payer: Self-pay | Admitting: Emergency Medicine

## 2020-06-08 ENCOUNTER — Ambulatory Visit: Payer: Medicare HMO | Admitting: Emergency Medicine

## 2020-06-08 ENCOUNTER — Ambulatory Visit (INDEPENDENT_AMBULATORY_CARE_PROVIDER_SITE_OTHER): Payer: Medicare HMO | Admitting: Emergency Medicine

## 2020-06-08 ENCOUNTER — Other Ambulatory Visit: Payer: Self-pay

## 2020-06-08 DIAGNOSIS — R0602 Shortness of breath: Secondary | ICD-10-CM | POA: Diagnosis not present

## 2020-06-08 DIAGNOSIS — J302 Other seasonal allergic rhinitis: Secondary | ICD-10-CM | POA: Diagnosis not present

## 2020-06-08 LAB — PULMONARY FUNCTION TEST
DL/VA % pred: 109 %
DL/VA: 4.51 ml/min/mmHg/L
DLCO cor % pred: 75 %
DLCO cor: 13.98 ml/min/mmHg
DLCO unc % pred: 75 %
DLCO unc: 13.98 ml/min/mmHg
FEF 25-75 Post: 1.62 L/sec
FEF 25-75 Pre: 1.43 L/sec
FEF2575-%Change-Post: 13 %
FEF2575-%Pred-Post: 104 %
FEF2575-%Pred-Pre: 91 %
FEV1-%Change-Post: 2 %
FEV1-%Pred-Post: 79 %
FEV1-%Pred-Pre: 77 %
FEV1-Post: 1.57 L
FEV1-Pre: 1.53 L
FEV1FVC-%Change-Post: 5 %
FEV1FVC-%Pred-Pre: 105 %
FEV6-%Change-Post: -1 %
FEV6-%Pred-Post: 74 %
FEV6-%Pred-Pre: 76 %
FEV6-Post: 1.88 L
FEV6-Pre: 1.92 L
FEV6FVC-%Change-Post: 0 %
FEV6FVC-%Pred-Post: 105 %
FEV6FVC-%Pred-Pre: 104 %
FVC-%Change-Post: -2 %
FVC-%Pred-Post: 71 %
FVC-%Pred-Pre: 73 %
FVC-Post: 1.88 L
FVC-Pre: 1.94 L
Post FEV1/FVC ratio: 83 %
Post FEV6/FVC ratio: 100 %
Pre FEV1/FVC ratio: 79 %
Pre FEV6/FVC Ratio: 99 %
RV % pred: 69 %
RV: 1.57 L
TLC % pred: 68 %
TLC: 3.36 L

## 2020-06-08 MED ORDER — IPRATROPIUM BROMIDE 0.03 % NA SOLN: 2.0000 | Freq: Four times a day (QID) | NASAL | Status: AC | PRN

## 2020-06-08 NOTE — Patient Instructions (Signed)
Your pulmonary function testing today was reassuring.  We will hold off on starting any inhaled medications right now. Try to slowly and steadily build up your exercise and your stamina. Continue your Allegra as you have been taking it Try using ipratropium nasal spray, 2 sprays each nostril 2-3 times a day if you need it for nasal congestion and drainage. Follow with Dr. Delton Coombes if needed for any changes in your breathing.

## 2020-06-08 NOTE — Assessment & Plan Note (Signed)
With reassuring pulmonary function testing and cardiac evaluation.  She does have restriction with a clear chest x-ray.  Suspect that this is related to some deconditioning and body habitus.  I encouraged her to start slowly and steadily increase her exercise routine, stamina.  No clear indication for bronchodilators at this time although we could consider going forward if her improvement plateaus.

## 2020-06-08 NOTE — Assessment & Plan Note (Signed)
Persistent clear nasal drainage.  Did respond some to nasal steroid but not fully.  She is on Allegra.  We will try ipratropium nasal spray as needed to see if we can give her periodic relief.

## 2020-06-08 NOTE — Progress Notes (Signed)
   Subjective:    Patient ID: Jaime Chavez, female    DOB: 07-30-1941, 79 y.o.   MRN: [id number]  HPI 78 year old never smoker with a history of CAD and CABG 2019, hyperlipidemia, GERD, allergic rhinitis.  She has longstanding dyspnea that she describes as SOB after walking 2 blocks, feels that she cannot get a deep breath in. Some occasional wheeze when supine. She has clear allergy drainage to her throat - has occasional cough. No chest pain. She did have some improvement in her exercise tolerance after the CABG, but still limited.  On cetrizine, fluticasone nasal spray   CXR 09/02/2018 reviewed by me.   Spirometry done 05/10/2018 reviewed by me, shows evidence for restriction and possible superimposed obstruction, FEV1 69-73% predicted.  ROV 06/08/20 --follow-up visit for 79 year old never smoker with a CAD/CABG, hyperlipidemia, GERD, rhinitis.  I saw her last month for slow progression of longstanding exertional shortness of breath.  Sometimes associated with wheezing.  Chest x-ray 05/16/2020 reviewed by me, show clear lungs, no effusions or infiltrates. She reports that she is still having dyspnea w exertion. She is having a lot of nasal drainage to her throat, some associated cough. Some improvement w nasal steroid. Is on allegra.   Pulmonary function testing done today, reviewed by me, shows principally restriction on spirometry, question some mild superimposed obstruction.  Lung volumes are restricted.  Diffusion capacity is decreased and corrects to the normal range when adjusted for alveolar volume.   Review of Systems As per HPI      Objective:   Physical Exam  Vitals:   06/08/20 1350  BP: 122/72  Pulse: 71  Temp: 97.7 F (36.5 C)  TempSrc: Temporal  SpO2: 97%  Weight: 171 lb 3.2 oz (77.7 kg)  Height: 5\' 3"  (1.6 m)   Gen: Pleasant, overwt woman, in no distress,  normal affect  ENT: No lesions,  mouth clear,  oropharynx clear, no postnasal drip  Neck: No JVD, no  stridor  Lungs: No use of accessory muscles, no crackles or wheezing on normal respiration, no wheeze on forced expiration  Cardiovascular: RRR, heart sounds normal, no murmur or gallops, no peripheral edema  Musculoskeletal: No deformities, no cyanosis or clubbing  Neuro: alert, awake, non focal  Skin: Warm, no lesions or rash      Assessment & Plan:  Shortness of breath With reassuring pulmonary function testing and cardiac evaluation.  She does have restriction with a clear chest x-ray.  Suspect that this is related to some deconditioning and body habitus.  I encouraged her to start slowly and steadily increase her exercise routine, stamina.  No clear indication for bronchodilators at this time although we could consider going forward if her improvement plateaus.  Seasonal allergies Persistent clear nasal drainage.  Did respond some to nasal steroid but not fully.  She is on Allegra.  We will try ipratropium nasal spray as needed to see if we can give her periodic relief.  , MD, PhD 06/08/2020, 2:12 PM [facility] [phone] or if no answer [phone]

## 2020-06-08 NOTE — Progress Notes (Signed)
PFT done today. 

## 2020-06-08 NOTE — Addendum Note (Signed)
Addended by: Dorisann Frames R on: 06/08/2020 02:38 PM   Modules accepted: Orders

## 2020-06-09 MED ORDER — IPRATROPIUM BROMIDE 0.03 % NA SOLN: 2.0000 | Freq: Four times a day (QID) | NASAL | 12 refills | Status: DC | PRN

## 2020-06-12 ENCOUNTER — Other Ambulatory Visit: Payer: Self-pay | Admitting: Internal Medicine

## 2020-06-14 MED ORDER — METOPROLOL TARTRATE 50 MG PO TABS
50.0000 mg | ORAL_TABLET | Freq: Two times a day (BID) | ORAL | 1 refills | Status: DC
Start: 2020-06-14 — End: 2020-08-16

## 2020-08-16 MED ORDER — METOPROLOL TARTRATE 50 MG PO TABS
50.0000 mg | ORAL_TABLET | Freq: Two times a day (BID) | ORAL | 3 refills | Status: DC
Start: 2020-08-16 — End: 2020-12-13

## 2020-09-23 NOTE — Progress Notes (Unsigned)
Cardiology Office Note:    Date:  09/24/2020   ID:  Jaime Chavez, DOB 11-Apr-1941, MRN [id number]  PCP:  Shirlean Mylar, MD  Cardiologist:  Parke Poisson, MD  Electrophysiologist:  None   Referring MD: Shirlean Mylar, MD   Chief Complaint/Reason for Referral: CAD s/p CABG 2019  History of Present Illness:    Jaime Chavez is a 80 y.o. female with a history of CAD s/p CABG x 4 June 14, 2018. She has a history of GERD, HLD.   Pulmonary evaluation suggests restriction on PFTs, and SOB likely secondary to deconditioning and body habitus.   Her husband Reggie recently was hospitalized for COVID-19 pneumonia. He is still on oxygen and has not fully recovered. I have expressed my sympathies. The patient tested negative for Covid but was symptomatic prior to her husband becoming Covid positive. She is feeling back to baseline. She has a new great grandbaby who is 41 month old, and her son who is the child's grandfather plans to have a bowel renewal soon. They will be getting together with family, which will surely lift Reggie spirits. She is not yet vaccinated for COVID-19, and I have strongly recommended that she do so. She is hesitant due to several friends in [place] who have reported deaths after COVID-19 injection. We discussed that the benefits of vaccination outweigh the risks particularly in her case having coronary artery disease severe enough to require CABG. She will think about it.  She denies chest pain. Has some mild shortness of breath with activity which is baseline. Denies palpitations. She feels that the metoprolol 50 mg twice daily has been quite helpful recently and shortness of breath has improved while on this therapy. We discussed up titration at her request, however blood pressure and heart rate are both lower limit of normal, and dosage may be appropriate as it is today.   Past Medical History:  Diagnosis Date  . Acid reflux   . Coronary artery disease involving native  coronary artery of native heart with angina pectoris (HCC) 06/13/2018  . Dyspnea   . Environmental allergies   . Hyperlipidemia 2014  . Left patella fracture 2005  . S/P CABG x 4 06/14/2018   LIMA to LAD, SVG to OM, Sequential SVG to RCA and PDA, EVH from bilateral thighs and RLL    Past Surgical History:  Procedure Laterality Date  . CARDIAC CATHETERIZATION  06/13/2018  . CORONARY ARTERY BYPASS GRAFT N/A 06/14/2018   Procedure: CORONARY ARTERY BYPASS GRAFTING (CABG) times 4 using left internal mammary artery and right saphenous vein using endoscope.;  Surgeon: [name], MD;  Location: [facility] OR;  Service: Open Heart Surgery;  Laterality: N/A;  . DILATION AND CURETTAGE, DIAGNOSTIC / THERAPEUTIC     for nonmalignant polyp  . RIGHT/LEFT HEART CATH AND CORONARY ANGIOGRAPHY N/A 06/13/2018   Procedure: RIGHT/LEFT HEART CATH AND CORONARY ANGIOGRAPHY;  Surgeon: [name], MD;  Location: [facility] INVASIVE CV LAB;  Service: Cardiovascular;  Laterality: N/A;  . TEE WITHOUT CARDIOVERSION N/A 06/14/2018   Procedure: TRANSESOPHAGEAL ECHOCARDIOGRAM (TEE);  Surgeon: [name], MD;  Location: [facility] OR;  Service: Open Heart Surgery;  Laterality: N/A;    Current Medications: Current Meds  Medication Sig  . acetaminophen (TYLENOL) 325 MG tablet Take 650 mg by mouth every 6 (six) hours as needed.  Marland Kitchen aspirin EC 81 MG tablet Take 1 tablet (81 mg total) by mouth daily.  . calcium carbonate (OS-CAL) 600 MG TABS tablet Take 600 mg by  mouth 2 (two) times daily with a meal.  . cetirizine (ZYRTEC) 10 MG tablet Take 10 mg by mouth daily.  . fluticasone (FLONASE) 50 MCG/ACT nasal spray Place into both nostrils as needed.   . Glucosamine-Chondroit-Vit C-Mn (GLUCOSAMINE CHONDR 1500 COMPLX) CAPS Take 1 capsule by mouth 2 (two) times daily.  Marland Kitchen ipratropium (ATROVENT) 0.03 % nasal spray Place 2 sprays into both nostrils 4 (four) times daily as needed for rhinitis.  . metoprolol tartrate (LOPRESSOR) 50 MG tablet  Take 1 tablet (50 mg total) by mouth 2 (two) times daily.  . Multiple Vitamins-Minerals (CENTRUM SILVER 50+WOMEN) TABS Take 1 tablet by mouth daily.  . rosuvastatin (CRESTOR) 20 MG tablet Take 1 tablet (20 mg total) by mouth daily.   Current Facility-Administered Medications for the 09/24/20 encounter (Office Visit) with Parke Poisson, MD  Medication  . ipratropium (ATROVENT) 0.03 % nasal spray 2 spray     Allergies:   Osteoporosis support [a-g pro] and Alendronate sodium   Social History   Tobacco Use  . Smoking status: Never Smoker  . Smokeless tobacco: Never Used  Vaping Use  . Vaping Use: Never used  Substance Use Topics  . Alcohol use: Yes    Comment: wine and liquor, about 1 drink per day  . Drug use: Never     Family History: The patient's family history includes Breast cancer in her paternal aunt; Colon cancer in her sister; Heart attack in her father; Other in her father; Pancreatic cancer in her mother.  ROS:   Please see the history of present illness.    All other systems reviewed and are negative.  EKGs/Labs/Other Studies Reviewed:    The following studies were reviewed today:  EKG:  NSR, poor R wave progression.   Recent Labs: No results found for requested labs within last 8760 hours.  Recent Lipid Panel    Component Value Date/Time   CHOL 151 10/17/2018 0901   TRIG 161 (H) 10/17/2018 0901   HDL 60 10/17/2018 0901   CHOLHDL 2.5 10/17/2018 0901   LDLCALC 59 10/17/2018 0901    Physical Exam:    VS:  BP (!) 144/76   Pulse 67   Ht 5' 2.5" (1.588 m)   Wt 170 lb (77.1 kg)   SpO2 96%   BMI 30.60 kg/m     Wt Readings from Last 5 Encounters:  09/24/20 170 lb (77.1 kg)  06/08/20 171 lb 3.2 oz (77.7 kg)  05/14/20 173 lb 6.4 oz (78.7 kg)  04/14/20 167 lb (75.8 kg)  02/25/20 172 lb (78 kg)    Constitutional: No acute distress Eyes: sclera non-icteric, normal conjunctiva and lids ENMT: normal dentition, moist mucous membranes Cardiovascular:  regular rhythm, normal rate, no murmurs. S1 and S2 normal. Radial pulses normal bilaterally. No jugular venous distention.  Respiratory: clear to auscultation bilaterally GI : normal bowel sounds, soft and nontender. No distention.   MSK: extremities warm, well perfused. No edema.  NEURO: grossly nonfocal exam, moves all extremities. PSYCH: alert and oriented x 3, normal mood and affect.   ASSESSMENT:    1. Dyspnea on exertion   2. S/P CABG x 4   3. Coronary artery disease involving native coronary artery of native heart without angina pectoris   4. Hypertriglyceridemia   5. Advice given about 2019 novel coronavirus infection    PLAN:    Dyspnea on exertion - Plan: EKG 12-Lead -Pulmonary evaluation completed and suggestive of deconditioning and shortness of breath secondary to body habitus.  Recommend moderate exercise program, and monitor for chest pain or worsening shortness of breath.  S/P CABG x 4 - Plan: EKG 12-Lead Coronary artery disease involving native coronary artery of native heart without angina pectoris - Plan: EKG 12-Lead -Continue aspirin 81 mg daily and Crestor 20 mg daily. -Continue metoprolol tartrate 50 mg twice daily. -Continue Imdur 30 mg daily.  Hypertriglyceridemia - Plan: EKG 12-Lead -Discussed dietary modifications. She has not had labs drawn since 2020, and has not seen her primary care physician in several years. I have encouraged her to reestablish care. I have offered laboratory studies in our office today, however we decided she will go back to see her primary doctor and have a physical exam with all laboratory testing needed for age-related screening. She agrees to do so.  Advice given about 2019 novel coronavirus infection -I would encourage her to get vaccinated for COVID-19 given her underlying medical issues which make her high risk if she were to contract COVID-19 infection. She recently had antibody testing which was negative suggesting she has not  recently had COVID-19, all the more reason to get vaccinated.  Total time of encounter: 30 minutes total time of encounter, including 25 minutes spent in face-to-face patient care on the date of this encounter. This time includes coordination of care and counseling regarding above mentioned problem list. Remainder of non-face-to-face time involved reviewing chart documents/testing relevant to the patient encounter and documentation in the medical record. I have independently reviewed documentation from referring provider.   Weston Brass, MD [place]  [facility]    Medication Adjustments/Labs and Tests Ordered: Current medicines are reviewed at length with the patient today.  Concerns regarding medicines are outlined above.   Orders Placed This Encounter  Procedures  . EKG 12-Lead    No orders of the defined types were placed in this encounter.   Patient Instructions  Medication Instructions:  No Changes In Medications at this time.  *If you need a refill on your cardiac medications before your next appointment, please call your pharmacy*  Follow-Up: At [facility], you and your health needs are our priority.  As part of our continuing mission to provide you with exceptional heart care, we have created designated Provider Care Teams.  These Care Teams include your primary Cardiologist (physician) and Advanced Practice Providers (APPs -  Physician Assistants and Nurse Practitioners) who all work together to provide you with the care you need, when you need it.  Your next appointment:   6 month(s)  The format for your next appointment:   In Person  Provider:   Weston Brass, MD

## 2020-09-24 ENCOUNTER — Encounter: Payer: Self-pay | Admitting: Internal Medicine

## 2020-09-24 ENCOUNTER — Other Ambulatory Visit: Payer: Self-pay

## 2020-09-24 ENCOUNTER — Ambulatory Visit: Payer: Medicare HMO | Admitting: Internal Medicine

## 2020-09-24 VITALS — BP 144/76 | HR 67 | Ht 62.5 in | Wt 170.0 lb

## 2020-09-24 DIAGNOSIS — Z951 Presence of aortocoronary bypass graft: Secondary | ICD-10-CM

## 2020-09-24 DIAGNOSIS — R0600 Dyspnea, unspecified: Secondary | ICD-10-CM | POA: Diagnosis not present

## 2020-09-24 DIAGNOSIS — R0609 Other forms of dyspnea: Secondary | ICD-10-CM

## 2020-09-24 DIAGNOSIS — Z7189 Other specified counseling: Secondary | ICD-10-CM

## 2020-09-24 DIAGNOSIS — I2510 Atherosclerotic heart disease of native coronary artery without angina pectoris: Secondary | ICD-10-CM | POA: Diagnosis not present

## 2020-09-24 DIAGNOSIS — E781 Pure hyperglyceridemia: Secondary | ICD-10-CM

## 2020-09-24 NOTE — Patient Instructions (Signed)

## 2020-09-27 MED ORDER — ISOSORBIDE MONONITRATE ER 30 MG PO TB24: 30.0000 mg | ORAL_TABLET | Freq: Every day | ORAL | 3 refills | Status: DC

## 2020-11-09 ENCOUNTER — Other Ambulatory Visit: Payer: Self-pay | Admitting: Internal Medicine

## 2020-11-09 DIAGNOSIS — E785 Hyperlipidemia, unspecified: Secondary | ICD-10-CM

## 2020-11-09 MED ORDER — ROSUVASTATIN CALCIUM 20 MG PO TABS: 20.0000 mg | ORAL_TABLET | Freq: Every day | ORAL | 3 refills | Status: DC

## 2020-12-13 MED ORDER — METOPROLOL TARTRATE 50 MG PO TABS: 50.0000 mg | ORAL_TABLET | Freq: Two times a day (BID) | ORAL | 3 refills | Status: DC

## 2020-12-30 MED ORDER — ISOSORBIDE MONONITRATE ER 30 MG PO TB24: 30.0000 mg | ORAL_TABLET | Freq: Every day | ORAL | 1 refills | Status: DC

## 2021-01-28 ENCOUNTER — Ambulatory Visit: Payer: Medicare HMO | Admitting: Family Medicine

## 2021-01-28 ENCOUNTER — Encounter: Payer: Self-pay | Admitting: Family Medicine

## 2021-01-28 ENCOUNTER — Other Ambulatory Visit: Payer: Self-pay

## 2021-01-28 VITALS — BP 152/82 | HR 70 | Ht 63.0 in | Wt 170.8 lb

## 2021-01-28 DIAGNOSIS — Z1159 Encounter for screening for other viral diseases: Secondary | ICD-10-CM | POA: Diagnosis not present

## 2021-01-28 DIAGNOSIS — M545 Low back pain, unspecified: Secondary | ICD-10-CM

## 2021-01-28 DIAGNOSIS — M810 Age-related osteoporosis without current pathological fracture: Secondary | ICD-10-CM

## 2021-01-28 DIAGNOSIS — Z23 Encounter for immunization: Secondary | ICD-10-CM

## 2021-01-28 DIAGNOSIS — I10 Essential (primary) hypertension: Secondary | ICD-10-CM

## 2021-01-28 DIAGNOSIS — E785 Hyperlipidemia, unspecified: Secondary | ICD-10-CM

## 2021-01-28 DIAGNOSIS — R030 Elevated blood-pressure reading, without diagnosis of hypertension: Secondary | ICD-10-CM

## 2021-01-28 DIAGNOSIS — S39012A Strain of muscle, fascia and tendon of lower back, initial encounter: Secondary | ICD-10-CM

## 2021-01-28 DIAGNOSIS — K219 Gastro-esophageal reflux disease without esophagitis: Secondary | ICD-10-CM

## 2021-01-28 NOTE — Progress Notes (Signed)
SUBJECTIVE:   Chief compliant/HPI: annual examination  Jaime Chavez is a 80 y.o. who presents today for an annual exam.   Back pain: patient reports that she was dead-heading roses in her garden yesterday and had a significantly sore back last night and today. She reports that she has OA in her knees, so kneeling down is painful for her and she tends to bend at the waist. Pain was across her low back, felt like muscle spasm. She denies incontinence or saddle anesthesia, no radiating pain. She has tried tylenol and rest for discomfort.   Indigestion: patient notices she occasionally has bad heart burn and she will use 10 mg famotidine when she notices the pain. She knows that her triggers are red wine and tomato products (specifically ketchup), but she still wants to have these occasionally. She uses a wedge pillow to elevate her head at night. She reports this does not feel like her chest pain or angina symptoms which she had previously.  Elevated BP: Patient reports compliance with medications, she takes her BP at home regularly and it is always <130/80. She denies symptoms. Today BP is elevated to 152/82. Will obtain CMP today.  CAD s/p CABG: patient had CABG in 2019, doing well and follows with Dr. Margaretann Loveless at [facility]. Reports no recent episodes of angina or DOE. Current medications are: imdur 30 mg, metoprolol tartrate 50 mg BID, crestor 20 mg, ASA 81 mg. Will obtain lipid panel today.  History tabs reviewed and updated.   Review of systems form reviewed and notable for sore back (see above).   OBJECTIVE:   BP (!) 152/82   Pulse 70   Ht 5' 3"  (1.6 m)   Wt 170 lb 12.8 oz (77.5 kg)   SpO2 98%   BMI 30.26 kg/m   Nursing note and vitals reviewed GEN: age-appropriate, WW, resting comfortably in chair, NAD, overweight HEENT: NCAT. PERRLA. Sclera without injection or icterus. MMM.  Neck: Supple. Thyroid smooth and not palpable, no LAD. Cardiac: Regular rate and rhythm. Normal S1/S2. No  murmurs, rubs, or gallops appreciated. 2+ radial pulses. Lungs: Clear bilaterally to ascultation. No increased WOB, no accessory muscle usage. No w/r/r. Lumbar spine: - Inspection: no gross deformity or asymmetry, swelling or ecchymosis. No skin changes - Palpation: No TTP over the spinous processes, or SI joints b/l. Mild TTP diffusely on b/l paraspinal muscles. - ROM: full active ROM of the lumbar spine in flexion and extension with some pain - Strength: 5/5 strength of lower extremity in L4-S1 nerve root distributions b/l - Neuro: sensation intact in the L4-S1 nerve root distribution b/l, 2+ L4 and S1 reflexes - Straight Leg Raise test: NEG Neuro: alert and at baseline Ext: no edema Psych: Pleasant and appropriate   ASSESSMENT/PLAN:   Strain of lumbar paraspinous muscle Patient with recent strain of back. Discussed conservative therapy such as ice/heat, tylenol, topical treatments such as salonpas, lidocaine patches, voltaren gel. Patient reports that she feels very tired and that she cannot function to do many of the things she previously enjoyed such as gardening. I suspect some level of deconditioning also at play.  She is interested in physical therapy to help support and improve back. Referral placed.   Acid reflux Recommended limiting known triggers. Also recommend doing 2 week famotidine 20 mg BID to see if recent exacerbation can be more controlled. If she persists having these symptoms despite dietary changes and famotidine, may have to consider angina vs further GERD workup.  Elevated  BP without diagnosis of hypertension Patient with only 2 other elevated BP's per chart review in July-August 2021. She reports that she regularly checks BP at home and it is <130/80. However, SBP elevated to 150s today. Recommend patient keep log for next week using BP cuff, taking pressure at same time every day for 1 week. Then follow up in 1 month and recheck. Potentially could be white coat  syndrome.   Hyperlipidemia Patient reports compliance with crestor 20 mg. Will check lipid panel and CMP today.    Annual Examination  See AVS for age appropriate recommendations  PHQ score 2, reviewed and discussed.  BP reviewed and not at goal 152/82, see this problem above Asked about intimate partner violence, reports she feels safe in her relationships.  Considered the following items based upon USPSTF recommendations: Diabetes screening:  discussed and not ordered, outside of screening period, no consistent symptoms, was WNL at last check Screening for elevated cholesterol: discussed and ordered HIV testing: discussed and low risk, did not order Hepatitis C: discussed and ordered Hepatitis B: discussed, low risk, did not order Syphilis if at high risk:  low risk, did not order GC/CT not at high risk and not ordered. Osteoporosis screening considered based upon risk of fracture from [facility] calculator. Patient had osteoporosis on last DEXA scan in 2019 which showed femur of -2.7. She previously was on alendronate, but stopped taking it after a bad reaction. Recommend repeat DEXA ordered.  Reviewed risk factors for latent tuberculosis and not indicated   Discussed family history, BRCA testing not indicated. Discussed with patient that her last mammogram in 2019 showed some cysts in her left breast and a repeat screening was recommended for a year after that. Patient is outside of the screening range. We discussed pros and cons of screening, as it may incur more invasive tests and anxiety, but it may also catch early stages of cancer. At this time she does not wish to continue any further screening.  Cervical cancer screening:  not due given age, last pap WNL  . She reports history of normal paps throughout life. Last one in 2013 in [place]. Colorectal cancer screening: up to date on screening for CRC. She reports she last had a colonoscopy in 2016 and was told to repeat in 10 years if  desired. Lung cancer screening:  not ordered as patient has no history of smoking .  Vaccinations: discussed COVID vaccines, patient declines. Patient is UTD with TDAP, had one dose of shingles vaccine in 2013, does not want 2nd. She had pneumococcal 23 vaccine in 2013, opts for another dose today.   Follow up in 1 year or sooner if indicated.    Gladys Damme, MD [place]

## 2021-01-28 NOTE — Patient Instructions (Addendum)
It was a pleasure to see you today!  We will get some labs today.  If they are abnormal or we need to do something about them, I will call you.  If they are normal, I will send you a message on MyChart (if it is active) or a letter in the mail.  If you don't hear from [place] in 2 weeks, please call the office  [phone]. [facility] to [facility] density [facility] and we will follow up after those results For your back pain: continue using tylenol, you can also try topical creams like salonpas, voltaren gel, or a lidocaine patch (but don't use these things at the same time). I also placed a referral for physical therapy, you should receive a phone call in 1-2 weeks to     Be Well,  Dr. Leary Roca

## 2021-01-29 DIAGNOSIS — S39012A Strain of muscle, fascia and tendon of lower back, initial encounter: Secondary | ICD-10-CM | POA: Insufficient documentation

## 2021-01-29 DIAGNOSIS — R030 Elevated blood-pressure reading, without diagnosis of hypertension: Secondary | ICD-10-CM | POA: Insufficient documentation

## 2021-01-29 LAB — COMPREHENSIVE METABOLIC PANEL
ALT: 37 IU/L — ABNORMAL HIGH (ref 0–32)
AST: 26 IU/L (ref 0–40)
Albumin/Globulin Ratio: 2 (ref 1.2–2.2)
Albumin: 4.5 g/dL (ref 3.7–4.7)
Alkaline Phosphatase: 82 IU/L (ref 44–121)
BUN/Creatinine Ratio: 18 (ref 12–28)
BUN: 13 mg/dL (ref 8–27)
Bilirubin Total: 0.4 mg/dL (ref 0.0–1.2)
CO2: 21 mmol/L (ref 20–29)
Calcium: 9.6 mg/dL (ref 8.7–10.3)
Chloride: 105 mmol/L (ref 96–106)
Creatinine, Ser: 0.74 mg/dL (ref 0.57–1.00)
Globulin, Total: 2.3 g/dL (ref 1.5–4.5)
Glucose: 107 mg/dL — ABNORMAL HIGH (ref 65–99)
Potassium: 4.6 mmol/L (ref 3.5–5.2)
Sodium: 140 mmol/L (ref 134–144)
Total Protein: 6.8 g/dL (ref 6.0–8.5)
eGFR: 83 mL/min/{1.73_m2} (ref >59)

## 2021-01-29 LAB — LIPID PANEL
Chol/HDL Ratio: 2.9 ratio (ref 0.0–4.4)
Cholesterol, Total: 155 mg/dL (ref 100–199)
HDL: 53 mg/dL (ref >39)
LDL Chol Calc (NIH): 69 mg/dL (ref 0–99)
Triglycerides: 201 mg/dL — ABNORMAL HIGH (ref 0–149)
VLDL Cholesterol Cal: 33 mg/dL (ref 5–40)

## 2021-01-29 LAB — HEPATITIS C ANTIBODY: Hep C Virus Ab: 0.1 s/co ratio (ref 0.0–0.9)

## 2021-01-29 NOTE — Assessment & Plan Note (Signed)
Patient reports compliance with crestor 20 mg. Will check lipid panel and CMP today.

## 2021-01-29 NOTE — Assessment & Plan Note (Signed)
Patient with only 2 other elevated BP's per chart review in July-August 2021. She reports that she regularly checks BP at home and it is <130/80. However, SBP elevated to 150s today. Recommend patient keep log for next week using BP cuff, taking pressure at same time every day for 1 week. Then follow up in 1 month and recheck. Potentially could be white coat syndrome.

## 2021-01-29 NOTE — Assessment & Plan Note (Signed)
Recommended limiting known triggers. Also recommend doing 2 week famotidine 20 mg BID to see if recent exacerbation can be more controlled. If she persists having these symptoms despite dietary changes and famotidine, may have to consider angina vs further GERD workup.

## 2021-01-29 NOTE — Assessment & Plan Note (Signed)
Patient with recent strain of back. Discussed conservative therapy such as ice/heat, tylenol, topical treatments such as salonpas, lidocaine patches, voltaren gel. Patient reports that she feels very tired and that she cannot function to do many of the things she previously enjoyed such as gardening. I suspect some level of deconditioning also at play.  She is interested in physical therapy to help support and improve back. Referral placed.

## 2021-02-10 ENCOUNTER — Other Ambulatory Visit: Payer: Self-pay

## 2021-02-10 ENCOUNTER — Encounter: Payer: Self-pay | Admitting: Physical Therapy

## 2021-02-10 ENCOUNTER — Ambulatory Visit: Payer: Medicare HMO | Attending: Family Medicine | Admitting: Physical Therapy

## 2021-02-10 DIAGNOSIS — M6281 Muscle weakness (generalized): Secondary | ICD-10-CM | POA: Insufficient documentation

## 2021-02-10 DIAGNOSIS — M545 Low back pain, unspecified: Secondary | ICD-10-CM

## 2021-02-10 DIAGNOSIS — G8929 Other chronic pain: Secondary | ICD-10-CM | POA: Insufficient documentation

## 2021-02-10 DIAGNOSIS — R2689 Other abnormalities of gait and mobility: Secondary | ICD-10-CM | POA: Diagnosis present

## 2021-02-10 NOTE — Patient Instructions (Signed)
Access Code: [email or web address] URL: [email or web address] Date: 02/10/2021 Prepared by: Alphonzo Severance  Exercises Supine Posterior Pelvic Tilt - 2 x daily - 7 x weekly - 2 sets - 10 reps - 5'' hold Supine Bridge - 1 x daily - 7 x weekly - 3 sets - 10 reps - 3'' hold Hooklying Clamshell with Resistance - 1 x daily - 7 x weekly - 3 sets - 10 reps

## 2021-02-10 NOTE — Therapy (Signed)
[facility] [address] [place], [place], [address] Phone: [phone]   Fax:  [phone]  Physical Therapy Evaluation  Patient Details  Name: Jaime Chavez MRN: [id number] Date of Birth: 1940-09-03 Referring Provider (PT): [name], MD  Encounter Date: 02/10/2021        Past Medical History:  Diagnosis Date   Acid reflux    Coronary artery disease involving native coronary artery of native heart with angina pectoris (HCC) 06/13/2018   Dyspnea    Environmental allergies    Hyperlipidemia 2014   Left patella fracture 2005   S/P CABG x 4 06/14/2018   LIMA to LAD, SVG to OM, Sequential SVG to RCA and PDA, EVH from bilateral thighs and RLL    Past Surgical History:  Procedure Laterality Date   CARDIAC CATHETERIZATION  06/13/2018   CORONARY ARTERY BYPASS GRAFT N/A 06/14/2018   Procedure: CORONARY ARTERY BYPASS GRAFTING (CABG) times 4 using left internal mammary artery and right saphenous vein using endoscope.;  Surgeon: [name], MD;  [facility] OR;  Service: Open Heart Surgery;  Laterality: N/A;   DILATION AND CURETTAGE, DIAGNOSTIC / THERAPEUTIC     for nonmalignant polyp   RIGHT/LEFT HEART CATH AND CORONARY ANGIOGRAPHY N/A 06/13/2018   Procedure: RIGHT/LEFT HEART CATH AND CORONARY ANGIOGRAPHY;  Surgeon: [name], MD;  [facility] INVASIVE CV LAB;  Service: Cardiovascular;  Laterality: N/A;   TEE WITHOUT CARDIOVERSION N/A 06/14/2018   Procedure: TRANSESOPHAGEAL ECHOCARDIOGRAM (TEE);  Surgeon: [name], MD;  Location: [facility] OR;  Service: Open Heart Surgery;  Laterality: N/A;    There were no vitals filed for this visit.    Subjective Assessment - 02/10/21 1418     Subjective Jaime Chavez is a 80 y.o. female who presents to clinic with chief complaint of low back back pain which is worse with bending.  MOI/History of condition: chronic low back pain with no tauma, insidious onset.  Pain  location: diffuse low back pain; RLS at night.  Red flags: denies n/t, denies bb changes, denies saddle anesthesia.  48 hour pain intensity:  highest 6/10, current 2/10, best 0/10.  Aggs: bending forward (almost immediate), working in garden (can complete for ~30 min), lifting.  Eases: rest (slowly improves takes until next day to recover).  Nature: dull aching  Severity: moderate  Irritability: moderate    Stage: chronic.  Stability: getting worse.  24 hour pattern: stiff in morning, increases with activity at end of day  Vocation/requirements: retired.  Hobbies: gardening.  Functional limitations/goals: gardening, use home bicycle.  Home environment: lives with husband, 2 STE, 1 story house.  Assistive device: none.    Pertinent History CABG 2019, bil knee OA                [facility] PT Assessment - 02/10/21 0001       Assessment   Medical Diagnosis Lumbar back pain (M54.50)    Referring Provider (PT) [name], MD    Onset Date/Surgical Date 02/10/18    Hand Dominance Right    Next MD Visit na    Prior Therapy chiropractor      Precautions   Precaution Comments CABG, bil knee OA      Restrictions   Other Position/Activity Restrictions none      Balance Screen   Has the patient fallen in the past 6 months No      Observation/Other Assessments   Observations loss of pelvic rotation during gait  Focus on Therapeutic Outcomes (FOTO)  47      Sensation   Light Touch Appears Intact      Functional Tests   Functional tests Single leg stance;Other;Other2      Single Leg Stance   Comments 3'' ea      Other:   Other/ Comments 30'' STS: 8x      Other:   Other/Comments heel/toe walk WNL      AROM   AROM Assessment Site Lumbar    Lumbar Flexion limited 25%    Lumbar Extension limited 25% w/ pain    Lumbar - Right Side Bend limited 25% w/ pain    Lumbar - Left Side Bend limited 25% w/ pain    Lumbar - Right Rotation limited 25%    Lumbar - Left Rotation limited 25%       Strength   Strength Assessment Site Hip;Knee;Ankle    Right/Left Hip Right;Left    Right Hip Flexion 4/5    Right Hip Extension 4/5    Right Hip ABduction 3+/5    Left Hip Flexion 3+/5    Left Hip Extension 3+/5    Left Hip ABduction 3/5    Right/Left Knee Right;Left    Right Knee Flexion 5/5    Right Knee Extension 5/5    Left Knee Flexion 5/5    Left Knee Extension 5/5    Right/Left Ankle Right;Left      Flexibility   Soft Tissue Assessment /Muscle Length yes    Hamstrings WFL    Quadriceps bil hip flexor tightness      Palpation   Spinal mobility hypomobile lumbar spine    Palpation comment TTP lumbar paraspinals      Special Tests   Other special tests SLR (-) bil                        Objective measurements completed on examination: See above findings.               PT Education - 02/10/21 1454     Education Details HEP, POC, diagnosis, prognosis    Person(s) Educated Patient    Methods Explanation;Verbal cues    Comprehension Verbalized understanding              PT Short Term Goals - 02/10/21 1504       PT SHORT TERM GOAL #1   Title Jaime Chavez will be >75% HEP compliant within 3 weeks to improve carryover between sessions and facilitate independent management of condition.    Target Date 03/03/21               PT Long Term Goals - 02/10/21 1504       PT LONG TERM GOAL #1   Title Jaime Chavez will improve FOTO score from 47 to 58 as a proxy for functional improvement    Target Date 04/07/21      PT LONG TERM GOAL #2   Title Jaime Chavez will be able to garden as long as desired, not limited by pain  EVAL: ~30'    Target Date 04/07/21      PT LONG TERM GOAL #3   Title Jaime Chavez will be able to stand for >10''' in SLS stance, to show a significant improvement in balance in order to reduce fall risk  EVAL: 3'''    Target Date 04/07/21      PT LONG TERM GOAL #4   Title Jaime Chavez  will improve 30'' STS  (MCID 2) to >/= 12x to show improved LE strength and improved transfers  EVAL: 8x    Target Date 04/07/21                    Plan - 02/10/21 1502     Clinical Impression Statement Jaime Chavez is a 80 y.o. female who presents to clinic with signs and sxs consistent with mechanical low back pain.  No signs of radiculopathy at this time.  She does have hx of RLS which bothers her at night, but this does not appear related to her LBP.  Pt presents with pain and notable deficits in: hip strength, lumbar mobility, balance, endurance, LE strength.  Pt is limited functionally in gardening, walking for exercise, and lifting.  Pt will benefit from skilled therapy to address pain and the listed deficits in order to achieve functional goals, enable safety and independence in completion of daily tasks, and return to PLOF.    Personal Factors and Comorbidities Age;Behavior Pattern;Comorbidity 1    Comorbidities CABG 2019    Examination-Activity Limitations Squat;Bend    Examination-Participation Restrictions Yard Work    Stability/Clinical Decision Making Stable/Uncomplicated    Clinical Decision Making Low    Rehab Potential Good    PT Frequency 2x / week    PT Duration 8 weeks    PT Treatment/Interventions ADLs/Self Care Home Management;Aquatic Therapy;Therapeutic exercise;Therapeutic activities;Gait training;Neuromuscular re-education;Manual techniques;Dry needling    PT Next Visit Plan increase lumbar mobility, hip flexor flexibility, progress core strengthening, balance    PT Home Exercise Plan [id number]    Consulted and Agree with Plan of Care Patient             Patient will benefit from skilled therapeutic intervention in order to improve the following deficits and impairments:  Abnormal gait, Decreased activity tolerance, Pain, Decreased strength, Decreased range of motion, Decreased balance  Visit Diagnosis: Chronic bilateral low back pain without sciatica - Plan: PT plan of  care cert/re-cert  Muscle weakness - Plan: PT plan of care cert/re-cert  Balance problem - Plan: PT plan of care cert/re-cert  Other abnormalities of gait and mobility - Plan: PT plan of care cert/re-cert     Problem List Patient Active Problem List   Diagnosis Date Noted   Strain of lumbar paraspinous muscle 01/29/2021   Elevated BP without diagnosis of hypertension 01/29/2021   S/P CABG x 4 06/14/2018   Unstable angina (HCC) 06/13/2018   Coronary artery disease involving native coronary artery with unstable angina pectoris (HCC) 06/13/2018   UARS (upper airway resistance syndrome) 05/31/2018   Primary sleep disturbance 05/31/2018   Shortness of breath 05/09/2018   New-onset angina (HCC) 05/09/2018   Seborrheic keratosis 02/25/2018   Seasonal allergies 02/22/2018   Acid reflux 02/22/2018   Hyperlipidemia 02/22/2018    Alphonzo Severance PT, DPT 02/10/21 3:11 PM   [facility] [address] [place], [place], [address] Phone: [phone]   Fax:  [phone]  Name: Jaime Chavez MRN: [id number] Date of Birth: 1941-02-16

## 2021-02-14 ENCOUNTER — Ambulatory Visit: Payer: Medicare HMO | Admitting: Physical Therapy

## 2021-02-14 ENCOUNTER — Encounter: Payer: Self-pay | Admitting: Physical Therapy

## 2021-02-14 ENCOUNTER — Other Ambulatory Visit: Payer: Self-pay

## 2021-02-14 DIAGNOSIS — M6281 Muscle weakness (generalized): Secondary | ICD-10-CM

## 2021-02-14 DIAGNOSIS — M545 Low back pain, unspecified: Secondary | ICD-10-CM | POA: Diagnosis not present

## 2021-02-14 DIAGNOSIS — R2689 Other abnormalities of gait and mobility: Secondary | ICD-10-CM

## 2021-02-14 DIAGNOSIS — G8929 Other chronic pain: Secondary | ICD-10-CM

## 2021-02-14 NOTE — Therapy (Signed)
[facility] Outpatient Rehabilitation [facility] [address] [place], [place], [address] Phone: [phone]   Fax:  [phone]  Physical Therapy Treatment  Patient Details  Name: Jaime Chavez MRN: [id number] Date of Birth: 21-Sep-1940 Referring Provider (PT): Jaime Manners, MD   Encounter Date: 02/14/2021   PT End of Session - 02/14/21 0938     Visit Number 2    Number of Visits 16    Date for PT Re-Evaluation 04/07/21    Authorization Type MCR - FOTO 6th and 10th    Progress Note Due on Visit 10    PT Start Time 0930    PT Stop Time 1015    PT Time Calculation (min) 45 min             Past Medical History:  Diagnosis Date   Acid reflux    Coronary artery disease involving native coronary artery of native heart with angina pectoris (HCC) 06/13/2018   Dyspnea    Environmental allergies    Hyperlipidemia 2014   Left patella fracture 2005   S/P CABG x 4 06/14/2018   LIMA to LAD, SVG to OM, Sequential SVG to RCA and PDA, EVH from bilateral thighs and RLL    Past Surgical History:  Procedure Laterality Date   CARDIAC CATHETERIZATION  06/13/2018   CORONARY ARTERY BYPASS GRAFT N/A 06/14/2018   Procedure: CORONARY ARTERY BYPASS GRAFTING (CABG) times 4 using left internal mammary artery and right saphenous vein using endoscope.;  Surgeon: Jaime Nails, MD;  [facility] OR;  Service: Open Heart Surgery;  Laterality: N/A;   DILATION AND CURETTAGE, DIAGNOSTIC / THERAPEUTIC     for nonmalignant polyp   RIGHT/LEFT HEART CATH AND CORONARY ANGIOGRAPHY N/A 06/13/2018   Procedure: RIGHT/LEFT HEART CATH AND CORONARY ANGIOGRAPHY;  Surgeon: Jaime Kendall, MD;  [facility] INV[facility];  Service: Cardiovascular;  Laterality: N/A;   TEE WITHOUT CARDIOVERSION N/A 06/14/2018   Procedure: TRANSESOPHAGEAL ECHOCARDIOGRAM (TEE);  Surgeon: Jaime Nails, MD;  Location: [facility] OR;  Service: Open Heart Surgery;  Laterality: N/A;    There were no vitals filed for  this visit.   Subjective Assessment - 02/14/21 0936     Subjective Low energy today, unsure why. Back was sore this morning, maybe a 1/10.    Currently in Pain? Yes    Pain Score 1     Pain Location Back    Pain Orientation Mid;Lower    Pain Descriptors / Indicators Aching;Sore    Pain Type Chronic pain    Aggravating Factors  bending a certain way    Pain Relieving Factors avoid bending                               [facility] Adult PT Treatment/Exercise - 02/14/21 0001       Lumbar Exercises: Stretches   Lower Trunk Rotation 3 reps;30 seconds    Lower Trunk Rotation Limitations Rt/LT    Hip Flexor Stretch 3 reps;30 seconds    Hip Flexor Stretch Limitations Rt/LT      Lumbar Exercises: Seated   Sit to Stand 10 reps    Sit to Stand Limitations cues for hip hinge      Lumbar Exercises: Supine   Pelvic Tilt 10 reps;5 seconds    Pelvic Tilt Limitations 2 sets    Clam 10 reps   cues for band placement and abdominal draw in   Clam Limitations 2 sets,  red    Bridge 10 reps    Bridge Limitations 2 sets                    PT Education - 02/14/21 1025     Education Details HEP    Person(s) Educated Patient    Methods Explanation;Handout    Comprehension Verbalized understanding              PT Short Term Goals - 02/10/21 1504       PT SHORT TERM GOAL #1   Title [name] will be >75% HEP compliant within 3 weeks to improve carryover between sessions and facilitate independent management of condition.    Target Date 03/03/21               PT Long Term Goals - 02/10/21 1504       PT LONG TERM GOAL #1   Title [name] will improve FOTO score from 47 to 58 as a proxy for functional improvement    Target Date 04/07/21      PT LONG TERM GOAL #2   Title [name] will be able to garden as long as desired, not limited by pain  EVAL: ~30'    Target Date 04/07/21      PT LONG TERM GOAL #3   Title [name] will be able to  stand for >10''' in SLS stance, to show a significant improvement in balance in order to reduce fall risk  EVAL: 3'''    Target Date 04/07/21      PT LONG TERM GOAL #4   Title [name] will improve 30'' STS (MCID 2) to >/= 12x to show improved LE strength and improved transfers  EVAL: 8x    Target Date 04/07/21                   Plan - 02/14/21 1020     Clinical Impression Statement [name] reports compliance with HEP. Reviewed HEP and she required min cues for rest between sets and theraband placement for clam shells. Progressed per PT POC with hip flexor stretching which she reported min discomfort in lower lumbar. Began hip hinge education with sit-stands. She reports knees can be weak with steps and she notes back pain with certain ways she bends over but cannot recall the exact way. She was able to return correct hip hinge with sit-stands and it was added to HEP. She reported fatgue after 10 reps. At end of session she reported she could "feel it" a little in her back.    PT Next Visit Plan increase lumbar mobility, hip flexor flexibility, progress core strengthening, balance; check new additions to HEP- begin Nustep, SLS, body mechanics    PT Home Exercise Plan [email or web address]    Consulted and Agree with Plan of Care Patient             Patient will benefit from skilled therapeutic intervention in order to improve the following deficits and impairments:  Abnormal gait, Decreased activity tolerance, Pain, Decreased strength, Decreased range of motion, Decreased balance  Visit Diagnosis: Chronic bilateral low back pain without sciatica  Muscle weakness  Balance problem  Other abnormalities of gait and mobility     Problem List Patient Active Problem List   Diagnosis Date Noted   Strain of lumbar paraspinous muscle 01/29/2021   Elevated BP without diagnosis of hypertension 01/29/2021   S/P CABG x 4 06/14/2018   Unstable angina (HCC) 06/13/2018  Coronary artery  disease involving native coronary artery with unstable angina pectoris (HCC) 06/13/2018   UARS (upper airway resistance syndrome) 05/31/2018   Primary sleep disturbance 05/31/2018   Shortness of breath 05/09/2018   New-onset angina (HCC) 05/09/2018   Seborrheic keratosis 02/25/2018   Seasonal allergies 02/22/2018   Acid reflux 02/22/2018   Hyperlipidemia 02/22/2018    Sherrie Mustache, PTA 02/14/2021, 10:28 AM  [facility] [address] [place], [place], [address] Phone: [phone]   Fax:  [phone]  Name: Jaime Chavez MRN: [id number] Date of Birth: 31-Mar-1941

## 2021-02-14 NOTE — Patient Instructions (Signed)
Access Code: [email or web address] URL: [email or web address] Date: 02/14/2021 Prepared by: Jannette Spanner  Exercises Supine Posterior Pelvic Tilt - 2 x daily - 7 x weekly - 2 sets - 10 reps - 5'' hold Supine Bridge - 1 x daily - 7 x weekly - 3 sets - 10 reps - 3'' hold Hooklying Clamshell with Resistance - 1 x daily - 7 x weekly - 3 sets - 10 reps Sit to Stand - 1 x daily - 7 x weekly - 1-2 sets - 10 reps Modified Thomas Stretch - 1 x daily - 7 x weekly - 1 sets - 3 reps - 30 hold Supine Lower Trunk Rotation - 1 x daily - 7 x weekly - 1 sets - 3 reps - 30 hold

## 2021-02-17 ENCOUNTER — Encounter: Payer: Self-pay | Admitting: Physical Therapy

## 2021-02-17 ENCOUNTER — Ambulatory Visit: Payer: Medicare HMO | Admitting: Physical Therapy

## 2021-02-17 ENCOUNTER — Other Ambulatory Visit: Payer: Self-pay

## 2021-02-17 DIAGNOSIS — M545 Low back pain, unspecified: Secondary | ICD-10-CM

## 2021-02-17 DIAGNOSIS — M6281 Muscle weakness (generalized): Secondary | ICD-10-CM

## 2021-02-17 DIAGNOSIS — R2689 Other abnormalities of gait and mobility: Secondary | ICD-10-CM

## 2021-02-17 DIAGNOSIS — G8929 Other chronic pain: Secondary | ICD-10-CM

## 2021-02-17 NOTE — Therapy (Signed)
[facility] Outpatient Rehabilitation [facility] [address] [place], [place], [address] Phone: [phone]   Fax:  [phone]  Physical Therapy Treatment  Patient Details  Name: Jaime Chavez MRN: [id number] Date of Birth: February 19, 1941 Referring Provider (PT): [name], MD   Encounter Date: 02/17/2021   PT End of Session - 02/17/21 1105     Visit Number 3    Number of Visits 16    Date for PT Re-Evaluation 04/07/21    Authorization Type MCR - FOTO 6th and 10th    PT Start Time 1101    PT Stop Time 1141    PT Time Calculation (min) 40 min    Activity Tolerance Patient tolerated treatment well    Behavior During Therapy [facility] for tasks assessed/performed             Past Medical History:  Diagnosis Date   Acid reflux    Coronary artery disease involving native coronary artery of native heart with angina pectoris (HCC) 06/13/2018   Dyspnea    Environmental allergies    Hyperlipidemia 2014   Left patella fracture 2005   S/P CABG x 4 06/14/2018   LIMA to LAD, SVG to OM, Sequential SVG to RCA and PDA, EVH from bilateral thighs and RLL    Past Surgical History:  Procedure Laterality Date   CARDIAC CATHETERIZATION  06/13/2018   CORONARY ARTERY BYPASS GRAFT N/A 06/14/2018   Procedure: CORONARY ARTERY BYPASS GRAFTING (CABG) times 4 using left internal mammary artery and right saphenous vein using endoscope.;  Surgeon: [name], MD;  [facility];  Service: Open Heart Surgery;  Laterality: N/A;   DILATION AND CURETTAGE, DIAGNOSTIC / THERAPEUTIC     for nonmalignant polyp   RIGHT/LEFT HEART CATH AND CORONARY ANGIOGRAPHY N/A 06/13/2018   Procedure: RIGHT/LEFT HEART CATH AND CORONARY ANGIOGRAPHY;  Surgeon: [name], MD;  [facility] INV[facility] LAB;  Service: Cardiovascular;  Laterality: N/A;   TEE WITHOUT CARDIOVERSION N/A 06/14/2018   Procedure: TRANSESOPHAGEAL ECHOCARDIOGRAM (TEE);  Surgeon: [name], MD;  Location: [facility] OR;   Service: Open Heart Surgery;  Laterality: N/A;    There were no vitals filed for this visit.   Subjective Assessment - 02/17/21 1104     Subjective Back has been sore in lower area but has been better with the exercises. I havent been pushing it so overall its better.    Currently in Pain? Yes    Pain Score 2    verymild   Pain Location Back    Pain Orientation Mid;Lower    Pain Descriptors / Indicators Aching;Sore    Pain Type Chronic pain                               [facility] Adult PT Treatment/Exercise - 02/17/21 0001       Neuro Re-ed    Neuro Re-ed Details  SLS 15 sec right, 20 sec left, tandem >1 minute      Lumbar Exercises: Stretches   Lower Trunk Rotation 3 reps;30 seconds    Lower Trunk Rotation Limitations Rt/LT    Hip Flexor Stretch 3 reps;30 seconds    Hip Flexor Stretch Limitations Rt/LT      Lumbar Exercises: Aerobic   Nustep L3 uE/LE x 5 minutes      Lumbar Exercises: Seated   Sit to Stand 10 reps      Lumbar Exercises: Supine   Pelvic Tilt  10 reps;5 seconds    Pelvic Tilt Limitations 2 sets    Clam 10 reps   cues for band placement and abdominal draw in   Clam Limitations 2 sets, red    Bridge 10 reps    Bridge Limitations 2 sets                    PT Education - 02/17/21 1143     Education Details HEP    Person(s) Educated Patient    Methods Explanation;Handout    Comprehension Verbalized understanding              PT Short Term Goals - 02/10/21 1504       PT SHORT TERM GOAL #1   Title [name] will be >75% HEP compliant within 3 weeks to improve carryover between sessions and facilitate independent management of condition.    Target Date 03/03/21               PT Long Term Goals - 02/10/21 1504       PT LONG TERM GOAL #1   Title [name] will improve FOTO score from 47 to 58 as a proxy for functional improvement    Target Date 04/07/21      PT LONG TERM GOAL #2   Title [name] will  be able to garden as long as desired, not limited by pain  EVAL: ~30'    Target Date 04/07/21      PT LONG TERM GOAL #3   Title [name] will be able to stand for >10''' in SLS stance, to show a significant improvement in balance in order to reduce fall risk  EVAL: 3'''    Target Date 04/07/21      PT LONG TERM GOAL #4   Title [name] will improve 30'' STS (MCID 2) to >/= 12x to show improved LE strength and improved transfers  EVAL: 8x    Target Date 04/07/21                   Plan - 02/17/21 1126     Clinical Impression Statement [name] reports continued compliance with HEP and notes imrpovement. She c/o a pain today that is wrapping around from her right breast to her right shoulder blade. She reports the wrapping around to shoulder blade pain is new today. The right under breast pain has been preasant since heart surgery. Continued with HEP and progressed balance with tandem and SLS trials.. She maintained tandem for >1 minute and SLS 15 and 20 seconds. Balance with added to HEP. Afterward she reported feeling fine.    PT Next Visit Plan increase lumbar mobility, hip flexor flexibility, progress core strengthening, balance; check new additions to HEP- continue Nustep, SLS, body mechanics    PT Home Exercise Plan [id number]    Consulted and Agree with Plan of Care Patient             Patient will benefit from skilled therapeutic intervention in order to improve the following deficits and impairments:  Abnormal gait, Decreased activity tolerance, Pain, Decreased strength, Decreased range of motion, Decreased balance  Visit Diagnosis: Chronic bilateral low back pain without sciatica  Other abnormalities of gait and mobility  Muscle weakness  Balance problem     Problem List Patient Active Problem List   Diagnosis Date Noted   Strain of lumbar paraspinous muscle 01/29/2021   Elevated BP without diagnosis of hypertension 01/29/2021   S/P CABG x  4  06/14/2018   Unstable angina (HCC) 06/13/2018   Coronary artery disease involving native coronary artery with unstable angina pectoris (HCC) 06/13/2018   UARS (upper airway resistance syndrome) 05/31/2018   Primary sleep disturbance 05/31/2018   Shortness of breath 05/09/2018   New-onset angina (HCC) 05/09/2018   Seborrheic keratosis 02/25/2018   Seasonal allergies 02/22/2018   Acid reflux 02/22/2018   Hyperlipidemia 02/22/2018    Sherrie Mustache, PTA 02/17/2021, 11:48 AM  [facility] [address] [place], [place], [address] Phone: [phone]   Fax:  [phone]  Name: Fraida Veldman MRN: [id number] Date of Birth: 1941/02/19

## 2021-02-17 NOTE — Patient Instructions (Signed)
Access Code: [email or web address] URL: [email or web address] Date: 02/17/2021 Prepared by: Jannette Spanner  Exercises Supine Posterior Pelvic Tilt - 2 x daily - 7 x weekly - 2 sets - 10 reps - 5'' hold Supine Bridge - 1 x daily - 7 x weekly - 3 sets - 10 reps - 3'' hold Hooklying Clamshell with Resistance - 1 x daily - 7 x weekly - 3 sets - 10 reps Sit to Stand - 1 x daily - 7 x weekly - 1-2 sets - 10 reps Modified Thomas Stretch - 1 x daily - 7 x weekly - 1 sets - 3 reps - 30 hold Supine Lower Trunk Rotation - 1 x daily - 7 x weekly - 1 sets - 3 reps - 30 hold Standing Single Leg Stance with Counter Support - 1 x daily - 7 x weekly - 1 sets - 1-3 reps - up to 60 hold Standing Tandem Balance with Counter Support - 1 x daily - 7 x weekly - 1 sets - 1-3 reps - up to 60 hold

## 2021-02-22 ENCOUNTER — Encounter: Payer: Self-pay | Admitting: Physical Therapy

## 2021-02-22 ENCOUNTER — Other Ambulatory Visit: Payer: Self-pay

## 2021-02-22 ENCOUNTER — Ambulatory Visit: Payer: Medicare HMO | Attending: Family Medicine | Admitting: Physical Therapy

## 2021-02-22 DIAGNOSIS — M6281 Muscle weakness (generalized): Secondary | ICD-10-CM | POA: Diagnosis present

## 2021-02-22 DIAGNOSIS — M545 Low back pain, unspecified: Secondary | ICD-10-CM | POA: Diagnosis not present

## 2021-02-22 DIAGNOSIS — G8929 Other chronic pain: Secondary | ICD-10-CM

## 2021-02-22 DIAGNOSIS — R2689 Other abnormalities of gait and mobility: Secondary | ICD-10-CM | POA: Insufficient documentation

## 2021-02-22 NOTE — Patient Instructions (Signed)
Access Code: [email or web address] URL: [email or web address] Date: 02/22/2021 Prepared by: Jannette Spanner  Exercises Supine Posterior Pelvic Tilt - 2 x daily - 7 x weekly - 2 sets - 10 reps - 5'' hold Supine Bridge - 1 x daily - 7 x weekly - 3 sets - 10 reps - 3'' hold Hooklying Clamshell with Resistance - 1 x daily - 7 x weekly - 3 sets - 10 reps Sit to Stand - 1 x daily - 7 x weekly - 1-2 sets - 10 reps Modified Thomas Stretch - 1 x daily - 7 x weekly - 1 sets - 3 reps - 30 hold Supine Lower Trunk Rotation - 1 x daily - 7 x weekly - 1 sets - 3 reps - 30 hold Standing Single Leg Stance with Counter Support - 1 x daily - 7 x weekly - 1 sets - 1-3 reps - up to 60 hold Standing Tandem Balance with Counter Support - 1 x daily - 7 x weekly - 1 sets - 1-3 reps - up to 60 hold Standing Row with Anchored Resistance - 1 x daily - 7 x weekly - 2 sets - 10 reps - 5 hold Supine Active Straight Leg Raise - 1 x daily - 7 x weekly - 2 sets - 10 reps

## 2021-02-22 NOTE — Therapy (Signed)
[facility] [address] [place], [place], [address] Phone: [phone]   Fax:  [phone]  Physical Therapy Treatment  Patient Details  Name: Jaime Chavez MRN: [id number] Date of Birth: [birth year] Referring Provider (PT): [name], MD   Encounter Date: 02/22/2021   PT End of Session - 02/22/21 1103     Visit Number 4    Number of Visits 16    Date for PT Re-Evaluation 04/07/21    Authorization Type MCR - FOTO 6th and 10th    PT Start Time 1100    PT Stop Time 1145    PT Time Calculation (min) 45 min             Past Medical History:  Diagnosis Date   Acid reflux    Coronary artery disease involving native coronary artery of native heart with angina pectoris (HCC) 06/13/2018   Dyspnea    Environmental allergies    Hyperlipidemia 2014   Left patella fracture 2005   S/P CABG x 4 06/14/2018   LIMA to LAD, SVG to OM, Sequential SVG to RCA and PDA, EVH from bilateral thighs and RLL    Past Surgical History:  Procedure Laterality Date   CARDIAC CATHETERIZATION  06/13/2018   CORONARY ARTERY BYPASS GRAFT N/A 06/14/2018   Procedure: CORONARY ARTERY BYPASS GRAFTING (CABG) times 4 using left internal mammary artery and right saphenous vein using endoscope.;  Surgeon: [name], MD;  Location: [facility] OR;  Service: Open Heart Surgery;  Laterality: N/A;   DILATION AND CURETTAGE, DIAGNOSTIC / THERAPEUTIC     for nonmalignant polyp   RIGHT/LEFT HEART CATH AND CORONARY ANGIOGRAPHY N/A 06/13/2018   Procedure: RIGHT/LEFT HEART CATH AND CORONARY ANGIOGRAPHY;  Surgeon: [name], MD;  [facility]: [facility] INVASIVE CV LAB;  Service: Cardiovascular;  Laterality: N/A;   TEE WITHOUT CARDIOVERSION N/A 06/14/2018   Procedure: TRANSESOPHAGEAL ECHOCARDIOGRAM (TEE);  Surgeon: [name], MD;  Location: [facility] OR;  Service: Open Heart Surgery;  Laterality: N/A;    There were no vitals filed for this visit.   Subjective  Assessment - 02/22/21 1102     Subjective I had some lower back and upper back pain with bending yesterday.    Currently in Pain? No/denies    Pain Score 0-No pain    Pain Location Back    Pain Orientation Lower    Pain Descriptors / Indicators Sore    Aggravating Factors  bending a certain way    Pain Relieving Factors avoid bending                               [facility] Adult PT Treatment/Exercise - 02/22/21 0001       Lumbar Exercises: Stretches   Lower Trunk Rotation 3 reps;30 seconds    Lower Trunk Rotation Limitations Rt/LT    Hip Flexor Stretch 3 reps;30 seconds    Hip Flexor Stretch Limitations Rt/LT      Lumbar Exercises: Aerobic   Nustep L5 uE/LE x 5 minutes      Lumbar Exercises: Standing   Row 15 reps    Theraband Level (Row) Level 2 (Red)      Lumbar Exercises: Supine   Clam 10 reps   cues for band placement and abdominal draw in   Clam Limitations 2 sets, green    Bridge 10 reps    Bridge with clamshell 10 reps    Bridge with [employer]  Squeeze Limitations green    Straight Leg Raise 10 reps   2 sets   Straight Leg Raises Limitations with cues for abdominal draw in and maintain neutral spine      Lumbar Exercises: Sidelying   Hip Abduction 10 reps;Right;Left                      PT Short Term Goals - 02/10/21 1504       PT SHORT TERM GOAL #1   Title Jaime Chavez will be >75% HEP compliant within 3 weeks to improve carryover between sessions and facilitate independent management of condition.    Target Date 03/03/21               PT Long Term Goals - 02/10/21 1504       PT LONG TERM GOAL #1   Title Jaime Chavez will improve FOTO score from 47 to 58 as a proxy for functional improvement    Target Date 04/07/21      PT LONG TERM GOAL #2   Title Jaime Chavez will be able to garden as long as desired, not limited by pain  EVAL: ~30'    Target Date 04/07/21      PT LONG TERM GOAL #3   Title Jaime Chavez will be able to  stand for >10''' in SLS stance, to show a significant improvement in balance in order to reduce fall risk  EVAL: 3'''    Target Date 04/07/21      PT LONG TERM GOAL #4   Title Jaime Chavez will improve 30'' STS (MCID 2) to >/= 12x to show improved LE strength and improved transfers  EVAL: 8x    Target Date 04/07/21                   Plan - 02/22/21 1148     Clinical Impression Statement Jaime Chavez arrives reporting no pain at start of session however she did note upper and lower back pain yesterday when doing a lot of bending. She does feel like her core is stronger however still feels like knees are weak. Time spent with instruction in proper hip hinge and spine alignment for bending over. Used wooden dowel for feedback and she was able to return demonstrate. Began shoulder rows and progressed mat based hip strenthening. She felt a little lower back soreness after the Nustep today and she felt hip strain with side hip abduction. She was given updated HEP.    PT Next Visit Plan increase lumbar mobility, hip flexor flexibility, progress core strengthening, balance; check new additions to HEP- continue Nustep, SLS, reinforce body mechanics, LE strength    PT Home Exercise Plan [id number]             Patient will benefit from skilled therapeutic intervention in order to improve the following deficits and impairments:  Abnormal gait, Decreased activity tolerance, Pain, Decreased strength, Decreased range of motion, Decreased balance  Visit Diagnosis: Chronic bilateral low back pain without sciatica  Other abnormalities of gait and mobility  Muscle weakness  Balance problem     Problem List Patient Active Problem List   Diagnosis Date Noted   Strain of lumbar paraspinous muscle 01/29/2021   Elevated BP without diagnosis of hypertension 01/29/2021   S/P CABG x 4 06/14/2018   Unstable angina (HCC) 06/13/2018   Coronary artery disease involving native coronary artery with  unstable angina pectoris (HCC) 06/13/2018   UARS (upper airway resistance syndrome) 05/31/2018  Primary sleep disturbance 05/31/2018   Shortness of breath 05/09/2018   New-onset angina (HCC) 05/09/2018   Seborrheic keratosis 02/25/2018   Seasonal allergies 02/22/2018   Acid reflux 02/22/2018   Hyperlipidemia 02/22/2018    Sherrie Mustache, PTA 02/22/2021, 12:05 PM  [facility] [address] [place], [place], [address] Phone: [phone]   Fax:  [phone]  Name: Jaime Chavez MRN: [id number] Date of Birth: 22-Apr-1941

## 2021-02-24 ENCOUNTER — Other Ambulatory Visit: Payer: Self-pay

## 2021-02-24 ENCOUNTER — Ambulatory Visit: Payer: Medicare HMO | Admitting: Physical Therapy

## 2021-02-24 ENCOUNTER — Encounter: Payer: Self-pay | Admitting: Physical Therapy

## 2021-02-24 DIAGNOSIS — M545 Low back pain, unspecified: Secondary | ICD-10-CM

## 2021-02-24 DIAGNOSIS — M6281 Muscle weakness (generalized): Secondary | ICD-10-CM

## 2021-02-24 DIAGNOSIS — R2689 Other abnormalities of gait and mobility: Secondary | ICD-10-CM

## 2021-02-24 DIAGNOSIS — G8929 Other chronic pain: Secondary | ICD-10-CM

## 2021-02-24 NOTE — Therapy (Signed)
[facility] [address] [place], [place], [address] Phone: [phone]   Fax:  [phone]  Physical Therapy Treatment  Patient Details  Name: Jaime Chavez MRN: [id number] Date of Birth: 1941/02/18 Referring Provider (PT): [name], MD   Encounter Date: 02/24/2021   PT End of Session - 02/24/21 1119     Visit Number 5    Number of Visits 16    Date for PT Re-Evaluation 04/07/21    Authorization Type MCR - FOTO 6th and 10th    Progress Note Due on Visit 10    PT Start Time 1100    PT Stop Time 1143    PT Time Calculation (min) 43 min             Past Medical History:  Diagnosis Date   Acid reflux    Coronary artery disease involving native coronary artery of native heart with angina pectoris (HCC) 06/13/2018   Dyspnea    Environmental allergies    Hyperlipidemia 2014   Left patella fracture 2005   S/P CABG x 4 06/14/2018   LIMA to LAD, SVG to OM, Sequential SVG to RCA and PDA, EVH from bilateral thighs and RLL    Past Surgical History:  Procedure Laterality Date   CARDIAC CATHETERIZATION  06/13/2018   CORONARY ARTERY BYPASS GRAFT N/A 06/14/2018   Procedure: CORONARY ARTERY BYPASS GRAFTING (CABG) times 4 using left internal mammary artery and right saphenous vein using endoscope.;  Surgeon: [name], MD;  Location: [facility] OR;  Service: Open Heart Surgery;  Laterality: N/A;   DILATION AND CURETTAGE, DIAGNOSTIC / THERAPEUTIC     for nonmalignant polyp   RIGHT/LEFT HEART CATH AND CORONARY ANGIOGRAPHY N/A 06/13/2018   Procedure: RIGHT/LEFT HEART CATH AND CORONARY ANGIOGRAPHY;  Surgeon: [name], MD;  [facility]: [facility] INVASIVE CV LAB;  Service: Cardiovascular;  Laterality: N/A;   TEE WITHOUT CARDIOVERSION N/A 06/14/2018   Procedure: TRANSESOPHAGEAL ECHOCARDIOGRAM (TEE);  Surgeon: [name], MD;  Location: [facility] OR;  Service: Open Heart Surgery;  Laterality: N/A;    There were no vitals filed for  this visit.   Subjective Assessment - 02/24/21 1105     Subjective I have been much better. A little discomfort in the lower back after doing a lot yesterday. It usually would hurt more so i think the exercises are helping.    Currently in Pain? Yes    Pain Score 2     Pain Location Back    Pain Orientation Lower    Pain Descriptors / Indicators Discomfort                               [facility] Adult PT Treatment/Exercise - 02/24/21 0001       Neuro Re-ed    Neuro Re-ed Details  tandem on foam 12 sec best SLS on foam 5 sec best      Lumbar Exercises: Stretches   Lower Trunk Rotation 3 reps;30 seconds    Lower Trunk Rotation Limitations Rt/LT    Hip Flexor Stretch 3 reps;30 seconds    Hip Flexor Stretch Limitations Rt/LT      Lumbar Exercises: Aerobic   Nustep L4 uE/LE x 5 minutes      Lumbar Exercises: Standing   Heel Raises 10 reps    Heel Raises Limitations on foam pad    Row 15 reps    Theraband Level (Row) Level 2 (Red)  Other Standing Lumbar Exercises hip hinge with and wooden dowel and cues to maintain neutral spine x 15 sit-stand      Lumbar Exercises: Supine   Clam 10 reps   cues for band placement and abdominal draw in   Clam Limitations 2 sets, green    Bridge with clamshell 10 reps   2 sets   Bridge with [employer] Limitations green    Straight Leg Raise 10 reps   2 sets   Straight Leg Raises Limitations with cues for abdominal draw in and maintain neutral spine                      PT Short Term Goals - 02/10/21 1504       PT SHORT TERM GOAL #1   Title [name] will be >75% HEP compliant within 3 weeks to improve carryover between sessions and facilitate independent management of condition.    Target Date 03/03/21               PT Long Term Goals - 02/24/21 1142       PT LONG TERM GOAL #1   Title [name] will improve FOTO score from 47 to 58 as a proxy for functional improvement    Status On-going       PT LONG TERM GOAL #2   Title [name] will be able to garden as long as desired, not limited by pain  EVAL: ~30'    Baseline improving ability to perform chores and yardwork without increased pain    Status On-going      PT LONG TERM GOAL #3   Title [name] will be able to stand for >10''' in SLS stance, to show a significant improvement in balance in order to reduce fall risk  EVAL: 3'''    Baseline 15 sec , 20 sec    Status Achieved      PT LONG TERM GOAL #4   Title [name] will improve 30'' STS (MCID 2) to >/= 12x to show improved LE strength and improved transfers  EVAL: 8x    Status On-going                   Plan - 02/24/21 1137     Clinical Impression Statement [name] arrives reporting improvement. SHe noted decreased pain yesterday wtih laundry, mopping and planting flowers. Also, today she reports only having discomfort in her low back at 2/10. Normally, her back pain would be higher the day after chores and gardening. Progressed with balance challenges on unlevel surface and she required intermittent touch to maintain balance. Continued core/hip stabilization and sit-stands with cues for hip hinge. Placing one hand behind head and one hand at sacrum were beneficial for her to avoid rounding her spine. Afterward she reported feeling no increased pain.    PT Next Visit Plan FOTO and check LTGS, review and update HEP, increase lumbar mobility, hip flexor flexibility, progress core strengthening, balance; check new additions to HEP- continue Nustep, SLS, reinforce body mechanics, LE strength    PT Home Exercise Plan [email or web address]             Patient will benefit from skilled therapeutic intervention in order to improve the following deficits and impairments:  Abnormal gait, Decreased activity tolerance, Pain, Decreased strength, Decreased range of motion, Decreased balance  Visit Diagnosis: Chronic bilateral low back pain without sciatica  Other  abnormalities of gait and mobility  Muscle weakness  Balance problem     Problem List Patient Active Problem List   Diagnosis Date Noted   Strain of lumbar paraspinous muscle 01/29/2021   Elevated BP without diagnosis of hypertension 01/29/2021   S/P CABG x 4 06/14/2018   Unstable angina (HCC) 06/13/2018   Coronary artery disease involving native coronary artery with unstable angina pectoris (HCC) 06/13/2018   UARS (upper airway resistance syndrome) 05/31/2018   Primary sleep disturbance 05/31/2018   Shortness of breath 05/09/2018   New-onset angina (HCC) 05/09/2018   Seborrheic keratosis 02/25/2018   Seasonal allergies 02/22/2018   Acid reflux 02/22/2018   Hyperlipidemia 02/22/2018    Sherrie Mustache, PTA 02/24/2021, 11:55 AM  [facility] [address] [place], [place], [address] Phone: [phone]   Fax:  [phone]  Name: Jaime Chavez MRN: [id number] Date of Birth: 20-Dec-1940

## 2021-02-28 ENCOUNTER — Ambulatory Visit: Payer: Medicare HMO | Admitting: Physical Therapy

## 2021-02-28 ENCOUNTER — Encounter: Payer: Self-pay | Admitting: Physical Therapy

## 2021-02-28 ENCOUNTER — Other Ambulatory Visit: Payer: Self-pay

## 2021-02-28 DIAGNOSIS — R2689 Other abnormalities of gait and mobility: Secondary | ICD-10-CM

## 2021-02-28 DIAGNOSIS — M6281 Muscle weakness (generalized): Secondary | ICD-10-CM

## 2021-02-28 DIAGNOSIS — G8929 Other chronic pain: Secondary | ICD-10-CM

## 2021-02-28 DIAGNOSIS — M545 Low back pain, unspecified: Secondary | ICD-10-CM

## 2021-02-28 NOTE — Therapy (Addendum)
[place], [place], [address] Phone: [phone]   Fax:  [phone]  Physical Therapy Treatment/Discharge summary  Patient Details  Name: Jaime Chavez MRN: [id number] Date of Birth: [birth year] Referring Provider (PT): Zenia Resides, MD   Encounter Date: 02/28/2021   PT End of Session - 02/28/21 1109     Visit Number 6    Number of Visits 16    Date for PT Re-Evaluation 04/07/21    Authorization Type MCR - FOTO 6th and 10th    Progress Note Due on Visit 10    PT Start Time 1103    PT Stop Time 8250    PT Time Calculation (min) 42 min             Past Medical History:  Diagnosis Date   Acid reflux    Coronary artery disease involving native coronary artery of native heart with angina pectoris ([place]) 06/13/2018   Dyspnea    Environmental allergies    Hyperlipidemia 2014   Left patella fracture 2005   S/P CABG x 4 06/14/2018   LIMA to LAD, SVG to OM, Sequential SVG to RCA and PDA, EVH from bilateral thighs and RLL    Past Surgical History:  Procedure Laterality Date   CARDIAC CATHETERIZATION  06/13/2018   CORONARY ARTERY BYPASS GRAFT N/A 06/14/2018   Procedure: CORONARY ARTERY BYPASS GRAFTING (CABG) times 4 using left internal mammary artery and right saphenous vein using endoscope.;  Surgeon: Rexene Alberts, MD;  Location: [place];  Service: Open Heart Surgery;  Laterality: N/A;   DILATION AND CURETTAGE, DIAGNOSTIC / THERAPEUTIC     for nonmalignant polyp   RIGHT/LEFT HEART CATH AND CORONARY ANGIOGRAPHY N/A 06/13/2018   Procedure: RIGHT/LEFT HEART CATH AND CORONARY ANGIOGRAPHY;  Surgeon: Nelva Bush, MD;  Location: [facility] LAB;  Service: Cardiovascular;  Laterality: N/A;   TEE WITHOUT CARDIOVERSION N/A 06/14/2018   Procedure: TRANSESOPHAGEAL ECHOCARDIOGRAM (TEE);  Surgeon: Rexene Alberts, MD;  Location: [place];  Service: Open Heart Surgery;  Laterality: N/A;    There were no  vitals filed for this visit.   Subjective Assessment - 02/28/21 1107     Subjective I am doing much better. I still have occassional pains in the low back with certain things but no longer have pain in the upper back.    Currently in Pain? No/denies                [facility] PT Assessment - 02/28/21 0001       Observation/Other Assessments   Focus on Therapeutic Outcomes (FOTO)  63 % neck      Strength   Right Hip Flexion 4+/5    Right Hip ABduction 4/5    Left Hip Flexion 4+/5    Left Hip ABduction 4/5                           [facility] Adult PT Treatment/Exercise - 02/28/21 0001       Lumbar Exercises: Standing   Row 20 reps    Theraband Level (Row) Level 3 (Green)    Other Standing Lumbar Exercises sit-stand x 15 (in 30 seconds)      Lumbar Exercises: Supine   Bridge with clamshell 10 reps   2 sets   Bridge with Ball Squeeze Limitations green    Straight Leg Raise 10 reps   2 sets   Straight Leg Raises Limitations with  cues for abdominal draw in and maintain neutral spine      Lumbar Exercises: Sidelying   Hip Abduction 10 reps;Right;Left    Hip Abduction Weights (lbs) 2 sets                      PT Short Term Goals - 02/28/21 1109       PT SHORT TERM GOAL #1   Title Jaime Chavez will be >75% HEP compliant within 3 weeks to improve carryover between sessions and facilitate independent management of condition.    Time 3    Period Weeks    Status Achieved    Target Date 03/03/21               PT Long Term Goals - 02/28/21 1114       PT LONG TERM GOAL #1   Title Jaime Chavez will improve FOTO score from 47 to 58 as a proxy for functional improvement    Period Weeks    Status Achieved      PT LONG TERM GOAL #2   Title Jaime Chavez will be able to garden as long as desired, not limited by pain  EVAL: ~30'    Baseline does not feel very limited anymore    Status Achieved      PT LONG TERM GOAL #3   Title Jaime Chavez will  be able to stand for >10''' in SLS stance, to show a significant improvement in balance in order to reduce fall risk  EVAL: 3'''    Baseline 15 sec , 20 sec    Status Achieved      PT LONG TERM GOAL #4   Title Jaime Chavez will improve 30'' STS (MCID 2) to >/= 12x to show improved LE strength and improved transfers  EVAL: 8x    Baseline EVAL: 8x, 02/28/21: 15x    Period Weeks    Status Achieved                   Plan - 02/28/21 Jaime Chavez reports continued improvement in her pain with ADLS. She notes that her upper back pain has resolved and she only has occassional low back pain. She reports that she has not been limited in her household and outdoor activities recently. Her FOTO score has improved to beyond predicted and her 5 x STS has improved to 15 reps. At this point she has met all her LTGs and is ready for discharge to HEP. She was given an updated HEP with consolidated exercises.    PT Next Visit Plan Discharge to [place] [id number]             PHYSICAL THERAPY DISCHARGE SUMMARY  Visits from Start of Care: 6  Current functional level related to goals / functional outcomes: Goals met, functional goals met (see goals section for details)   Remaining deficits: Will benefit from continued exercise at home, no gross deficits   Education / Equipment: See note   Patient agrees to discharge. Patient goals were met. Patient is being discharged due to meeting the stated rehab goals.   Patient will benefit from skilled therapeutic intervention in order to improve the following deficits and impairments:  Abnormal gait, Decreased activity tolerance, Pain, Decreased strength, Decreased range of motion, Decreased balance  Visit Diagnosis: Other abnormalities of gait and mobility  Chronic bilateral low back pain without sciatica  Muscle  weakness  Balance problem     Problem List Patient Active Problem  List   Diagnosis Date Noted   Strain of lumbar paraspinous muscle 01/29/2021   Elevated BP without diagnosis of hypertension 01/29/2021   S/P CABG x 4 06/14/2018   Unstable angina ([place]) 06/13/2018   Coronary artery disease involving native coronary artery with unstable angina pectoris ([place]) 06/13/2018   UARS (upper airway resistance syndrome) 05/31/2018   Primary sleep disturbance 05/31/2018   Shortness of breath 05/09/2018   New-onset angina ([place]) 05/09/2018   Seborrheic keratosis 02/25/2018   Seasonal allergies 02/22/2018   Acid reflux 02/22/2018   Hyperlipidemia 02/22/2018    Dorene Ar, PTA 02/28/2021, 12:17 PM  Shearon Balo PT, DPT 03/19/21 9:02 AM   [facility] [address] [place], [place], [address] Phone: [phone]   Fax:  [phone]  Name: Lilia Letterman MRN: [id number] Date of Birth: 1941/03/23

## 2021-03-03 ENCOUNTER — Encounter: Payer: Medicare HMO | Admitting: Physical Therapy

## 2021-03-07 ENCOUNTER — Encounter: Payer: Medicare HMO | Admitting: Physical Therapy

## 2021-03-10 ENCOUNTER — Encounter: Payer: Medicare HMO | Admitting: Physical Therapy

## 2021-03-14 ENCOUNTER — Encounter: Payer: Medicare HMO | Admitting: Physical Therapy

## 2021-03-16 ENCOUNTER — Encounter: Payer: Medicare HMO | Admitting: Physical Therapy

## 2021-03-21 ENCOUNTER — Encounter: Payer: Medicare HMO | Admitting: Physical Therapy

## 2021-03-23 ENCOUNTER — Encounter: Payer: Medicare HMO | Admitting: Physical Therapy

## 2021-03-28 ENCOUNTER — Other Ambulatory Visit: Payer: Self-pay | Admitting: Internal Medicine

## 2021-03-28 ENCOUNTER — Encounter: Payer: Medicare HMO | Admitting: Physical Therapy

## 2021-03-28 MED ORDER — NITROGLYCERIN 0.4 MG SL SUBL: 0.4000 mg | SUBLINGUAL_TABLET | SUBLINGUAL | 3 refills | Status: DC | PRN

## 2021-03-30 ENCOUNTER — Encounter: Payer: Medicare HMO | Admitting: Physical Therapy

## 2021-03-30 ENCOUNTER — Other Ambulatory Visit: Payer: Self-pay | Admitting: Internal Medicine

## 2021-04-11 ENCOUNTER — Telehealth: Payer: Self-pay | Admitting: Internal Medicine

## 2021-04-11 NOTE — Telephone Encounter (Signed)
*  STAT* If patient is at the pharmacy, call can be transferred to refill team.   1. Which medications need to be refilled? (please list name of each medication and dose if known) metoprolol tartrate (LOPRESSOR) 50 MG tablet  2. Which pharmacy/location (including street and city if local pharmacy) is medication to be sent to? [id number] - [place],  - [address]  3. Do they need a 30 day or 90 day supply? 30 day  Patient is scheduled for 8/26

## 2021-04-12 MED ORDER — METOPROLOL TARTRATE 50 MG PO TABS: 50.0000 mg | ORAL_TABLET | Freq: Two times a day (BID) | ORAL | 3 refills | Status: DC

## 2021-04-12 NOTE — Telephone Encounter (Signed)
Called patient and left message that RX was sent to Pharmacy

## 2021-04-15 ENCOUNTER — Other Ambulatory Visit: Payer: Self-pay

## 2021-04-15 ENCOUNTER — Ambulatory Visit: Payer: Medicare HMO | Admitting: Internal Medicine

## 2021-04-15 VITALS — BP 130/84 | HR 65 | Ht 62.5 in | Wt 169.6 lb

## 2021-04-15 DIAGNOSIS — I2510 Atherosclerotic heart disease of native coronary artery without angina pectoris: Secondary | ICD-10-CM | POA: Diagnosis not present

## 2021-04-15 DIAGNOSIS — Z951 Presence of aortocoronary bypass graft: Secondary | ICD-10-CM

## 2021-04-15 DIAGNOSIS — Z79899 Other long term (current) drug therapy: Secondary | ICD-10-CM

## 2021-04-15 DIAGNOSIS — R0600 Dyspnea, unspecified: Secondary | ICD-10-CM | POA: Diagnosis not present

## 2021-04-15 DIAGNOSIS — R0609 Other forms of dyspnea: Secondary | ICD-10-CM

## 2021-04-15 DIAGNOSIS — E785 Hyperlipidemia, unspecified: Secondary | ICD-10-CM

## 2021-04-15 MED ORDER — FUROSEMIDE 20 MG PO TABS: 20.0000 mg | ORAL_TABLET | Freq: Every day | ORAL | 1 refills | Status: DC

## 2021-04-15 NOTE — Progress Notes (Signed)
Cardiology Office Note:    Date:  04/15/2021   ID:  Stevee Valenta, [name] Oct 01, 1940, MRN [id number]  PCP:  Shirlean Mylar, MD  Cardiologist:  Parke Poisson, MD  Electrophysiologist:  None   Referring MD: Shirlean Mylar, MD   Chief Complaint/Reason for Referral: CAD s/p CABG 2019, chronic dyspnea on exertion  History of Present Illness:    Jaime Chavez is a 80 y.o. female with a history of CAD s/p CABG x 4 June 14, 2018. She has a history of GERD, HLD.  She has chronic dyspnea on exertion.  I had recommended pulmonary evaluation. Pulmonary evaluation suggests restriction on PFTs, and SOB likely secondary to deconditioning and body habitus.   Her husband Reggie joins her for the visit today after fully recovering from COVID-19 pneumonia requiring hospitalization.  She is overall feeling well and travel to [place] recently.  She recalls that this trip went far better than her trip to [place] where she had limiting shortness of breath.  Reggie however notes that she experienced shortness of breath requiring frequent breaks, and because she took the breaks this time as opposed to the trip to [place] she fared much better.  She feels that since she was a teenager she has had dyspnea on exertion and it has not significantly changed from pre or post coronary revascularization.  She denies chest pain. Denies palpitations. She feels that the metoprolol 50 mg twice daily has been the most helpful therapy.  She infrequently has to take nitroglycerin, no more than a few times a year.  Her last episode of chest pressure she took a total of 2 nitroglycerin but did not feel that it was helpful and felt this may be more GI in origin.  We discussed that when she is ambulating if she has symptoms of chest pressure she is welcome to take a nitroglycerin but I have discussed ED precautions with the patient regarding use of nitro x3 in the setting of significant chest pressure.  The patient  denies chest pain, frequent chest pressure, dyspnea at rest, palpitations, PND, orthopnea, or leg swelling. Denies cough, fever, chills. Denies nausea, vomiting. Denies syncope or presyncope. Denies dizziness or lightheadedness.   Past Medical History:  Diagnosis Date   Acid reflux    Coronary artery disease involving native coronary artery of native heart with angina pectoris (HCC) 06/13/2018   Dyspnea    Environmental allergies    Hyperlipidemia 2014   Left patella fracture 2005   S/P CABG x 4 06/14/2018   LIMA to LAD, SVG to OM, Sequential SVG to RCA and PDA, EVH from bilateral thighs and RLL    Past Surgical History:  Procedure Laterality Date   CARDIAC CATHETERIZATION  06/13/2018   CORONARY ARTERY BYPASS GRAFT N/A 06/14/2018   Procedure: CORONARY ARTERY BYPASS GRAFTING (CABG) times 4 using left internal mammary artery and right saphenous vein using endoscope.;  Surgeon: [name], MD;  Location: [facility] OR;  Service: Open Heart Surgery;  Laterality: N/A;   DILATION AND CURETTAGE, DIAGNOSTIC / THERAPEUTIC     for nonmalignant polyp   RIGHT/LEFT HEART CATH AND CORONARY ANGIOGRAPHY N/A 06/13/2018   Procedure: RIGHT/LEFT HEART CATH AND CORONARY ANGIOGRAPHY;  Surgeon: [name], MD;  Location: [facility] INVASIVE CV LAB;  Service: Cardiovascular;  Laterality: N/A;   TEE WITHOUT CARDIOVERSION N/A 06/14/2018   Procedure: TRANSESOPHAGEAL ECHOCARDIOGRAM (TEE);  Surgeon: [name], MD;  Location: [facility] OR;  Service: Open Heart Surgery;  Laterality: N/A;    Current  Medications: Current Meds  Medication Sig   acetaminophen (TYLENOL) 325 MG tablet Take 650 mg by mouth every 6 (six) hours as needed.   aspirin EC 81 MG tablet Take 1 tablet (81 mg total) by mouth daily.   calcium carbonate (OS-CAL) 600 MG TABS tablet Take 600 mg by mouth 2 (two) times daily with a meal.   cetirizine (ZYRTEC) 10 MG tablet Take 10 mg by mouth daily.   fluticasone (FLONASE) 50 MCG/ACT nasal spray Place into  both nostrils as needed.    Glucosamine-Chondroit-Vit C-Mn (GLUCOSAMINE CHONDR 1500 COMPLX) CAPS Take 1 capsule by mouth 2 (two) times daily.   ipratropium (ATROVENT) 0.03 % nasal spray Place 2 sprays into both nostrils 4 (four) times daily as needed for rhinitis.   isosorbide mononitrate (IMDUR) 30 MG 24 hr tablet Take 1 tablet (30 mg total) by mouth daily.   metoprolol tartrate (LOPRESSOR) 50 MG tablet Take 1 tablet (50 mg total) by mouth 2 (two) times daily.   Multiple Vitamins-Minerals (CENTRUM SILVER 50+WOMEN) TABS Take 1 tablet by mouth daily.   nitroGLYCERIN (NITROSTAT) 0.4 MG SL tablet Place 1 tablet (0.4 mg total) under the tongue every 5 (five) minutes as needed for chest pain.   rosuvastatin (CRESTOR) 20 MG tablet Take 1 tablet (20 mg total) by mouth daily.   Current Facility-Administered Medications for the 04/15/21 encounter (Office Visit) with Parke PoissonAcharya, Lavaun Greenfield A, MD  Medication   ipratropium (ATROVENT) 0.03 % nasal spray 2 spray     Allergies:   Osteoporosis support [a-g pro] and Alendronate sodium   Social History   Tobacco Use   Smoking status: Never   Smokeless tobacco: Never  Vaping Use   Vaping Use: Never used  Substance Use Topics   Alcohol use: Yes    Comment: wine and liquor, about 1 drink per day   Drug use: Never     Family History: The patient's family history includes Breast cancer in her paternal aunt; Colon cancer in her sister; Heart attack in her father; Other in her father; Pancreatic cancer in her mother.  ROS:   Please see the history of present illness.    All other systems reviewed and are negative.  EKGs/Labs/Other Studies Reviewed:    The following studies were reviewed today:  EKG: NSR, septal infarct pattern  09/24/20 NSR, poor R wave progression.   Recent Labs: 01/28/2021: ALT 37; BUN 13; Creatinine, Ser 0.74; Potassium 4.6; Sodium 140  Recent Lipid Panel    Component Value Date/Time   CHOL 155 01/28/2021 1206   TRIG 201 (H)  01/28/2021 1206   HDL 53 01/28/2021 1206   CHOLHDL 2.9 01/28/2021 1206   LDLCALC 69 01/28/2021 1206    Physical Exam:    VS:  BP 130/84 (BP Location: Left Arm, Patient Position: Sitting, Cuff Size: Normal)   Pulse 65   Ht 5' 2.5" (1.588 m)   Wt 169 lb 9.6 oz (76.9 kg)   SpO2 99%   BMI 30.53 kg/m     Wt Readings from Last 5 Encounters:  04/15/21 169 lb 9.6 oz (76.9 kg)  01/28/21 170 lb 12.8 oz (77.5 kg)  09/24/20 170 lb (77.1 kg)  06/08/20 171 lb 3.2 oz (77.7 kg)  05/14/20 173 lb 6.4 oz (78.7 kg)    Constitutional: No acute distress Eyes: sclera non-icteric, normal conjunctiva and lids ENMT: normal dentition, moist mucous membranes Cardiovascular: regular rhythm, normal rate, no murmurs. S1 and S2 normal. Radial pulses normal bilaterally. No jugular venous distention.  Respiratory: clear to auscultation bilaterally GI : normal bowel sounds, soft and nontender. No distention.   MSK: extremities warm, well perfused. No edema.  NEURO: grossly nonfocal exam, moves all extremities. PSYCH: alert and oriented x 3, normal mood and affect.   ASSESSMENT:    1. Dyspnea on exertion   2. Medication management   3. S/P CABG x 4   4. Coronary artery disease involving native coronary artery of native heart without angina pectoris   5. Hyperlipidemia, unspecified hyperlipidemia type     PLAN:    Dyspnea on exertion - Plan: EKG 12-Lead Medication management -Pulmonary evaluation completed and suggestive of deconditioning and shortness of breath secondary to body habitus. Recommend moderate exercise program, and monitor for chest pain or worsening shortness of breath. -We have not yet tried low-dose Lasix for the benefit of possible elevated LVEDP contributing to shortness of breath and chest pressure with activity.  I will have her take Lasix 20 mg daily for 7 days and check labs in 2 weeks, if helpful she can continue this therapy, if she would like she can also continue as  needed.  S/P CABG x 4 - Plan: EKG 12-Lead Coronary artery disease involving native coronary artery of native heart without angina pectoris - Plan: EKG 12-Lead -Continue aspirin 81 mg daily and Crestor 20 mg daily. -Continue metoprolol tartrate 50 mg twice daily. -Continue Imdur 30 mg daily.  She is not taking sublingual nitro frequently enough to uptitrate Imdur at this time.  She can continue using sublingual nitro as needed.  Hypertriglyceridemia - Plan: EKG 12-Lead -Continue Crestor 20 mg daily and dietary modifications.  May need to consider up titration of Crestor for the benefit of her triglycerides versus addition of triglyceride lowering therapy.  Total time of encounter: 35 minutes total time of encounter, including 28 minutes spent in face-to-face patient care on the date of this encounter. This time includes coordination of care and counseling regarding above mentioned problem list. Remainder of non-face-to-face time involved reviewing chart documents/testing relevant to the patient encounter and documentation in the medical record. I have independently reviewed documentation from referring provider.   Weston Brass, MD [place]  [facility]    Medication Adjustments/Labs and Tests Ordered: Current medicines are reviewed at length with the patient today.  Concerns regarding medicines are outlined above.   Orders Placed This Encounter  Procedures   Basic metabolic panel     Meds ordered this encounter  Medications   furosemide (LASIX) 20 MG tablet    Sig: Take 1 tablet (20 mg total) by mouth daily.    Dispense:  30 tablet    Refill:  1     Patient Instructions  Medication Instructions:  PLEASE START: LASIX (FUROSEMIDE) 20mg  DAILY- IF AFTER 7 DAYS THIS IS HELPFUL CAN CONTINUE DAILY IF NOT CAN USE THIS AS NEEDED  *If you need a refill on your cardiac medications before your next appointment, please call your pharmacy*  Lab Work: PLEASE RETURN TO OFFICE FOR  REPEAT BMET (BLOOD WORK) IN 10-14 DAYS- NO APPOINTMENT NEEDED. LAB HOURS ARE 8am-4pm Monday-Friday  If you have labs (blood work) drawn today and your tests are completely normal, you will receive your results only by: MyChart Message (if you have MyChart) OR A paper copy in the mail If you have any lab test that is abnormal or we need to change your treatment, we will call you to review the results.  Follow-Up: At [facility], you and your health  needs are our priority.  As part of our continuing mission to provide you with exceptional heart care, we have created designated Provider Care Teams.  These Care Teams include your primary Cardiologist (physician) and Advanced Practice Providers (APPs -  Physician Assistants and Nurse Practitioners) who all work together to provide you with the care you need, when you need it.  Your next appointment:   1 year(s)  The format for your next appointment:   In Person  Provider:   Weston Brass, MD

## 2021-04-15 NOTE — Patient Instructions (Signed)
Medication Instructions:  PLEASE START: LASIX (FUROSEMIDE) 20mg  DAILY- IF AFTER 7 DAYS THIS IS HELPFUL CAN CONTINUE DAILY IF NOT CAN USE THIS AS NEEDED  *If you need a refill on your cardiac medications before your next appointment, please call your pharmacy*  Lab Work: PLEASE RETURN TO OFFICE FOR REPEAT BMET (BLOOD WORK) IN 10-14 DAYS- NO APPOINTMENT NEEDED. LAB HOURS ARE 8am-4pm Monday-Friday  If you have labs (blood work) drawn today and your tests are completely normal, you will receive your results only by: MyChart Message (if you have MyChart) OR A paper copy in the mail If you have any lab test that is abnormal or we need to change your treatment, we will call you to review the results.  Follow-Up: At [facility], you and your health needs are our priority.  As part of our continuing mission to provide you with exceptional heart care, we have created designated Provider Care Teams.  These Care Teams include your primary Cardiologist (physician) and Advanced Practice Providers (APPs -  Physician Assistants and Nurse Practitioners) who all work together to provide you with the care you need, when you need it.  Your next appointment:   1 year(s)  The format for your next appointment:   In Person  Provider:   [facility], MD

## 2021-04-22 NOTE — Addendum Note (Signed)
Addended by: Sheretta Grumbine on: 04/22/2021 04:58 PM   Modules accepted: Orders  

## 2021-05-03 LAB — BASIC METABOLIC PANEL
BUN/Creatinine Ratio: 20 (ref 12–28)
BUN: 15 mg/dL (ref 8–27)
CO2: 24 mmol/L (ref 20–29)
Calcium: 9.6 mg/dL (ref 8.7–10.3)
Chloride: 100 mmol/L (ref 96–106)
Creatinine, Ser: 0.74 mg/dL (ref 0.57–1.00)
Glucose: 138 mg/dL — ABNORMAL HIGH (ref 65–99)
Potassium: 4.4 mmol/L (ref 3.5–5.2)
Sodium: 140 mmol/L (ref 134–144)
eGFR: 83 mL/min/{1.73_m2} (ref >59)

## 2021-06-13 MED ORDER — FUROSEMIDE 20 MG PO TABS: 20.0000 mg | ORAL_TABLET | Freq: Every day | ORAL | 1 refills | Status: DC

## 2021-06-14 NOTE — Telephone Encounter (Signed)
Please see other MyChart encounter. Prescription was renewed for patient. Will forward to MD just to make aware.

## 2021-07-28 ENCOUNTER — Encounter: Payer: Self-pay | Admitting: Internal Medicine

## 2021-07-28 MED ORDER — METOPROLOL TARTRATE 50 MG PO TABS: 50.0000 mg | ORAL_TABLET | Freq: Two times a day (BID) | ORAL | 3 refills | Status: DC

## 2021-07-29 ENCOUNTER — Ambulatory Visit
Admission: RE | Admit: 2021-07-29 | Discharge: 2021-07-29 | Disposition: A | Discharge status: Home / Self Care | Payer: Medicare HMO | Source: Ambulatory Visit | Attending: Family Medicine | Admitting: Family Medicine

## 2021-07-29 DIAGNOSIS — M810 Age-related osteoporosis without current pathological fracture: Secondary | ICD-10-CM

## 2021-08-02 ENCOUNTER — Telehealth: Payer: Self-pay | Admitting: Family Medicine

## 2021-08-02 ENCOUNTER — Encounter: Payer: Self-pay | Admitting: Family Medicine

## 2021-08-02 DIAGNOSIS — M810 Age-related osteoporosis without current pathological fracture: Secondary | ICD-10-CM

## 2021-08-02 HISTORY — DX: Age-related osteoporosis without current pathological fracture: M81.0

## 2021-08-02 NOTE — Telephone Encounter (Signed)
I spoke with Ms Heckel about the finding of osteoporosis on her recent DEXA.   She is hesitant to take medication therapy beyond Calcium/vit D. She had a severe reaction to the oral medication (listed as Osteoporosis Support in her list of Allergies), so is hesitant to start any other osteoporosis meds.  I mentioned she may wish to talk with our [facility] pharmacy team about possible therapies.  She may contact our office should she wish to meet with our pharmacist.

## 2021-08-31 ENCOUNTER — Encounter: Payer: Self-pay | Admitting: Internal Medicine

## 2021-09-01 ENCOUNTER — Encounter: Payer: Self-pay | Admitting: Internal Medicine

## 2021-09-01 ENCOUNTER — Other Ambulatory Visit: Payer: Self-pay | Admitting: Internal Medicine

## 2021-09-02 ENCOUNTER — Encounter: Payer: Self-pay | Admitting: Internal Medicine

## 2021-09-02 MED ORDER — FUROSEMIDE 20 MG PO TABS: 20.0000 mg | ORAL_TABLET | Freq: Every day | ORAL | 1 refills | Status: DC

## 2021-09-02 MED ORDER — ISOSORBIDE MONONITRATE ER 30 MG PO TB24: 30.0000 mg | ORAL_TABLET | Freq: Every day | ORAL | 1 refills | Status: DC

## 2021-10-09 ENCOUNTER — Other Ambulatory Visit: Payer: Self-pay | Admitting: Internal Medicine

## 2021-10-09 DIAGNOSIS — E785 Hyperlipidemia, unspecified: Secondary | ICD-10-CM

## 2021-10-20 ENCOUNTER — Other Ambulatory Visit: Payer: Self-pay

## 2021-10-20 ENCOUNTER — Encounter: Payer: Self-pay | Admitting: Internal Medicine

## 2021-10-20 DIAGNOSIS — E785 Hyperlipidemia, unspecified: Secondary | ICD-10-CM

## 2021-10-20 MED ORDER — ROSUVASTATIN CALCIUM 20 MG PO TABS: 20.0000 mg | ORAL_TABLET | Freq: Every day | ORAL | 3 refills | Status: DC

## 2022-01-24 ENCOUNTER — Encounter: Payer: Self-pay | Admitting: *Deleted

## 2022-02-21 ENCOUNTER — Other Ambulatory Visit: Payer: Self-pay | Admitting: Internal Medicine

## 2022-02-23 ENCOUNTER — Encounter: Payer: Self-pay | Admitting: Internal Medicine

## 2022-02-27 ENCOUNTER — Other Ambulatory Visit: Payer: Self-pay

## 2022-02-27 ENCOUNTER — Encounter: Payer: Self-pay | Admitting: Internal Medicine

## 2022-02-27 MED ORDER — FUROSEMIDE 20 MG PO TABS: 20.0000 mg | ORAL_TABLET | Freq: Every day | ORAL | 1 refills | Status: DC

## 2022-02-27 MED ORDER — ISOSORBIDE MONONITRATE ER 30 MG PO TB24: 30.0000 mg | ORAL_TABLET | Freq: Every day | ORAL | 1 refills | Status: DC

## 2022-03-07 ENCOUNTER — Encounter: Payer: Self-pay | Admitting: Internal Medicine

## 2022-06-12 ENCOUNTER — Encounter: Payer: Self-pay | Admitting: Family Medicine

## 2022-06-12 ENCOUNTER — Ambulatory Visit (INDEPENDENT_AMBULATORY_CARE_PROVIDER_SITE_OTHER): Payer: Medicare HMO | Admitting: Family Medicine

## 2022-06-12 VITALS — BP 139/70 | HR 72 | Ht 62.5 in | Wt 171.0 lb

## 2022-06-12 DIAGNOSIS — I10 Essential (primary) hypertension: Secondary | ICD-10-CM

## 2022-06-12 DIAGNOSIS — E559 Vitamin D deficiency, unspecified: Secondary | ICD-10-CM

## 2022-06-12 DIAGNOSIS — E785 Hyperlipidemia, unspecified: Secondary | ICD-10-CM

## 2022-06-12 DIAGNOSIS — K219 Gastro-esophageal reflux disease without esophagitis: Secondary | ICD-10-CM | POA: Diagnosis not present

## 2022-06-12 DIAGNOSIS — L821 Other seborrheic keratosis: Secondary | ICD-10-CM

## 2022-06-12 MED ORDER — OMEPRAZOLE 20 MG PO CPDR: 20.0000 mg | DELAYED_RELEASE_CAPSULE | Freq: Every day | ORAL | 3 refills | Status: DC

## 2022-06-12 NOTE — Assessment & Plan Note (Signed)
-  BP 139/70, at goal -continue lasix and metoprolol per cardiology -euvolemic on exam with unremarkable CV exam -pending BMP

## 2022-06-12 NOTE — Patient Instructions (Addendum)
It was great seeing you today!  Today we discussed your reflux symptoms, this seems to be due to GERD or gastroesophageal reflux disease. I have prescribed omeprazole 20 mg, please take this daily regardless of whether you are having symptoms. Please keep a log of your symptoms and what you are eating.   I will let you know of any abnormal results from the blood work today. We checked your cholesterol, kidney function, electrolytes and vitamin D.   Please follow up at your next scheduled appointment in 3-4 weeks, if anything arises between now and then, please don't hesitate to contact our office.   Thank you for allowing [place] to be a part of your medical care!  Thank you, Dr. Larae Grooms  Also a reminder of our clinic's no-show policy. Please make sure to arrive at least 15 minutes prior to your scheduled appointment time. Please try to cancel before 24 hours if you are not able to make it. If you no-show for 2 appointments then you will be receiving a warning letter. If you no-show after 3 visits, then you may be at risk of being dismissed from our clinic. This is to ensure that everyone is able to be seen in a timely manner. Thank you, we appreciate your assistance with this!

## 2022-06-12 NOTE — Assessment & Plan Note (Signed)
-  seems consistent with GERD, unremarkable abdominal exam without any significant distention or tenderness  -trial omeprazole 20 mg daily -instructed to maintain journal of symptoms and food she eats -follow up in 3-4 weeks, may consider increasing omeprazole, no need for EGD at this time as she has no red flag symptoms

## 2022-06-12 NOTE — Progress Notes (Signed)
    SUBJECTIVE:   CHIEF COMPLAINT / HPI:   Patient presents for physical. She has multiple concerns but her main concern today is the indigestion. She says that in the evenings, she is very careful what she eats but this often occurs in the evenings if she eats the wrong things. She sometimes will drink a small glass of wine at night and then makes her indigestion develop. Has never been on any medication for this. Occurs most evenings. Denies any current abdominal pain but when she has these episodes the pain occurs she gets a sensation of regurgitation with a burning sensation in her throat. Takes antacid sometimes which helps. Denies dysphagia, hemoptysis, dyspnae and chest pain.   Patient also concerned about mole on her abdomen that has been there for as long as she remembers. She denies noticing any growth or other changes to it. Denies bleeding or drainage.   Hypertension Patient denies chest pain, dyspnea or leg swelling. She checks her BP regularly at home and brings in her BP cuff today. Compliant on her lasix and metoprolol, had a CABG a few years ago and follows up with a cardiologist routinely.   OBJECTIVE:   BP 139/70   Pulse 72   Ht 5' 2.5" (1.588 m)   Wt 171 lb (77.6 kg)   SpO2 98%   BMI 30.78 kg/m   General: Patient well-appearing, in no acute distress. HEENT: PERRLA, non-tender thyroid, no evidence of cervical LAD CV: RRR, no murmurs or gallops auscultated Resp: CTAB, no wheezing, rales or rhonchi noted Abdomen: soft, nontender, nondistended, presence of bowel sounds Ext: No LE edema noted bilaterally Derm: slightly raised stuck on appearing nodule with her hyperpigmented along left abdomen   ASSESSMENT/PLAN:   Hypertension -BP 139/70, at goal -continue lasix and metoprolol per cardiology -euvolemic on exam with unremarkable CV exam -pending BMP  Hyperlipidemia -continue crestor -due for lipid panel, ordered today and pending  -discussed that regardless of  lipid panel results, she would benefit from continuing her statin in the setting of CAD  GERD (gastroesophageal reflux disease) -seems consistent with GERD, unremarkable abdominal exam without any significant distention or tenderness  -trial omeprazole 20 mg daily -instructed to maintain journal of symptoms and food she eats -follow up in 3-4 weeks, may consider increasing omeprazole, no need for EGD at this time as she has no red flag symptoms   Seborrheic keratosis -likely consistent with this diagnosis, explained to patient that this is benign -no concern for melanoma given how area has developed   Health maintenance -Med rec reviewed and updated appropriately -PHQ-9 score of 4 with negative question 9 reviewed.  -Pending Vitamin D level per patient preference  -Up to date on colonoscopy screening now that she is 81 years old     [name], [place]

## 2022-06-12 NOTE — Assessment & Plan Note (Signed)
-  continue crestor -due for lipid panel, ordered today and pending  -discussed that regardless of lipid panel results, she would benefit from continuing her statin in the setting of CAD

## 2022-06-12 NOTE — Assessment & Plan Note (Signed)
-  likely consistent with this diagnosis, explained to patient that this is benign -no concern for melanoma given how area has developed

## 2022-06-13 LAB — LIPID PANEL
Chol/HDL Ratio: 3.2 ratio (ref 0.0–4.4)
Cholesterol, Total: 164 mg/dL (ref 100–199)
HDL: 52 mg/dL (ref >39)
LDL Chol Calc (NIH): 63 mg/dL (ref 0–99)
Triglycerides: 317 mg/dL — ABNORMAL HIGH (ref 0–149)
VLDL Cholesterol Cal: 49 mg/dL — ABNORMAL HIGH (ref 5–40)

## 2022-06-13 LAB — BASIC METABOLIC PANEL
BUN/Creatinine Ratio: 15 (ref 12–28)
BUN: 12 mg/dL (ref 8–27)
CO2: 26 mmol/L (ref 20–29)
Calcium: 9.5 mg/dL (ref 8.7–10.3)
Chloride: 102 mmol/L (ref 96–106)
Creatinine, Ser: 0.79 mg/dL (ref 0.57–1.00)
Glucose: 151 mg/dL — ABNORMAL HIGH (ref 70–99)
Potassium: 4.8 mmol/L (ref 3.5–5.2)
Sodium: 143 mmol/L (ref 134–144)
eGFR: 77 mL/min/{1.73_m2} (ref >59)

## 2022-06-13 LAB — VITAMIN D 25 HYDROXY (VIT D DEFICIENCY, FRACTURES): Vit D, 25-Hydroxy: 33.7 ng/mL (ref 30.0–100.0)

## 2022-06-29 ENCOUNTER — Other Ambulatory Visit: Payer: Self-pay | Admitting: Internal Medicine

## 2022-07-18 ENCOUNTER — Ambulatory Visit (INDEPENDENT_AMBULATORY_CARE_PROVIDER_SITE_OTHER): Payer: Medicare HMO | Admitting: Family Medicine

## 2022-07-18 ENCOUNTER — Encounter: Payer: Self-pay | Admitting: Family Medicine

## 2022-07-18 VITALS — BP 130/70 | HR 74 | Ht 62.5 in | Wt 173.0 lb

## 2022-07-18 DIAGNOSIS — N393 Stress incontinence (female) (male): Secondary | ICD-10-CM | POA: Diagnosis not present

## 2022-07-18 DIAGNOSIS — K219 Gastro-esophageal reflux disease without esophagitis: Secondary | ICD-10-CM

## 2022-07-18 NOTE — Assessment & Plan Note (Signed)
-  Kegel exercises handout provided -consider myrbetriq at next visit, held off for now as patient prefers no medication at this time for her symptoms -may consider consider pelvic floor PT which we discussed as an option -consider UA next visit if still symptomatic, patient unable to give any today as she was not able to urinate at this time and had to leave to get to her husband's appointment on time  -follow up in 2 months

## 2022-07-18 NOTE — Patient Instructions (Addendum)
It was great seeing you today!  Today we discussed your urinary symptoms and reflux. I am glad that your reflux has improved. Please continue to take precautions with your reflux. Limit alcohol. Continue to take omeprazole 20 mg.   Regarding your urinary incontinence, it seems that sometimes the pelvic floor gets altered and doing Kegel exercises can help remold the area. Attached is a handout.   Please follow up at your next scheduled appointment in 2 months, if anything arises between now and then, please don't hesitate to contact our office.   Thank you for allowing [place] to be a part of your medical care!  Thank you, Dr. Robyne Peers  Also a reminder of our clinic's no-show policy. Please make sure to arrive at least 15 minutes prior to your scheduled appointment time. Please try to cancel before 24 hours if you are not able to make it. If you no-show for 2 appointments then you will be receiving a warning letter. If you no-show after 3 visits, then you may be at risk of being dismissed from our clinic. This is to ensure that everyone is able to be seen in a timely manner. Thank you, we appreciate your assistance with this!

## 2022-07-18 NOTE — Assessment & Plan Note (Signed)
-  symptoms significantly improved -continue omeprazole 20 mg daily -GERD precautions discussed

## 2022-07-18 NOTE — Progress Notes (Signed)
    SUBJECTIVE:   CHIEF COMPLAINT / HPI:   Patient presents for follow up for reflux. She was prescribed omeprazole and reports that it has improved her symptoms. She is no longer getting indigestion. When she first started taking it she experienced bloating which is much better now. Eating a large amount at night exacerbates her symptoms along with occasional wine. Patient has already made changes to accommodate this by eating smaller meals and drinks less wine.   Reports increased urinary frequency and goes to the restroom more often which has been occurring over the past few months. Denies dysuria, fever, chills, hematuria, abdominal pain or new back pain. Feels like she has drinking less fluids and has been working on drinking more. Enjoys drinking water. Her frequency has been more consistent and also reports stress incontinence with sneezing. Patient would like to hold off on medication therapy at this time.   OBJECTIVE:   BP 130/70   Pulse 74   Ht 5' 2.5" (1.588 m)   Wt 173 lb (78.5 kg)   SpO2 95%   BMI 31.14 kg/m   General: Patient well-appearing, in no acute distress. CV: RRR, no murmurs or gallops auscultated Resp: CTAB, no wheezing, rales or rhonchi  noted Abdomen: soft, nontender, nondistended, presence of bowel sounds Psych: mood appropriate, very pleasant   ASSESSMENT/PLAN:   GERD (gastroesophageal reflux disease) -symptoms significantly improved -continue omeprazole 20 mg daily -GERD precautions discussed   Stress incontinence -Kegel exercises handout provided -consider myrbetriq at next visit, held off for now as patient prefers no medication at this time for her symptoms -may consider consider pelvic floor PT which we discussed as an option -consider UA next visit if still symptomatic, patient unable to give any today as she was not able to urinate at this time and had to leave to get to her husband's appointment on time  -follow up in 2 months    -PHQ-9 score  of 4 with negative question 9 reviewed.    Reece Leader, DO [facility]

## 2022-07-24 ENCOUNTER — Encounter: Payer: Self-pay | Admitting: Internal Medicine

## 2022-07-24 MED ORDER — METOPROLOL TARTRATE 50 MG PO TABS: 50.0000 mg | ORAL_TABLET | Freq: Two times a day (BID) | ORAL | 1 refills | Status: DC

## 2022-08-18 ENCOUNTER — Encounter: Payer: Self-pay | Admitting: Internal Medicine

## 2022-08-18 MED ORDER — FUROSEMIDE 20 MG PO TABS: 20.0000 mg | ORAL_TABLET | Freq: Every day | ORAL | 0 refills | Status: DC

## 2022-08-30 ENCOUNTER — Encounter: Payer: Self-pay | Admitting: Family Medicine

## 2022-08-30 ENCOUNTER — Ambulatory Visit: Payer: Medicare HMO | Admitting: Family Medicine

## 2022-08-30 VITALS — BP 114/68 | HR 68 | Ht 62.5 in | Wt 174.0 lb

## 2022-08-30 DIAGNOSIS — N393 Stress incontinence (female) (male): Secondary | ICD-10-CM

## 2022-08-30 DIAGNOSIS — N3000 Acute cystitis without hematuria: Secondary | ICD-10-CM | POA: Diagnosis not present

## 2022-08-30 DIAGNOSIS — N390 Urinary tract infection, site not specified: Secondary | ICD-10-CM | POA: Insufficient documentation

## 2022-08-30 LAB — POCT URINALYSIS DIP (MANUAL ENTRY)
Bilirubin, UA: NEGATIVE
Blood, UA: NEGATIVE
Glucose, UA: NEGATIVE mg/dL
Ketones, POC UA: NEGATIVE mg/dL
Nitrite, UA: NEGATIVE
Protein Ur, POC: NEGATIVE mg/dL
Spec Grav, UA: 1.01 (ref 1.010–1.025)
Urobilinogen, UA: 0.2 E.U./dL
pH, UA: 6.5 (ref 5.0–8.0)

## 2022-08-30 LAB — POCT UA - MICROSCOPIC ONLY: WBC, Ur, HPF, POC: NONE SEEN (ref 0–5)

## 2022-08-30 MED ORDER — MIRABEGRON ER 25 MG PO TB24: 25.0000 mg | ORAL_TABLET | Freq: Every day | ORAL | 3 refills | Status: DC

## 2022-08-30 MED ORDER — CEPHALEXIN 500 MG PO CAPS: 500.0000 mg | ORAL_CAPSULE | Freq: Two times a day (BID) | ORAL | 0 refills | Status: AC

## 2022-08-30 NOTE — Patient Instructions (Addendum)
It was great seeing you today!  Today we discussed your urinary symptoms, we got a urine sample which is concerning enough for a urinary tract infection given your symptoms so I have prescribed you antibiotic treatment. Please take keflex 500 mg twice daily for 7 days, please make sure to complete treatment even if you happen to notice an improvement in your symptoms.   Regarding your incontinence, I have prescribed a medication called myrbetriq to help with the symptoms, please take 1 tablet daily. I strongly encourage continuing Kegel exercises and doing this more frequently (1-2 times a day) to really notice an improvement of your symptoms, this can help to strength your pelvic floor muscles. Try to limit coffee intake to 1-1.5 cups to see if this helps. Stay hydrated and trying to schedule time to urinate about every 2-3 hours may help with your symptoms as well.   Please follow up at your next scheduled appointment in 1 month if no improvement, if anything arises between now and then, please don't hesitate to contact our office.   Thank you for allowing [place] to be a part of your medical care!  Thank you, Dr. Larae Grooms  Also a reminder of our clinic's no-show policy. Please make sure to arrive at least 15 minutes prior to your scheduled appointment time. Please try to cancel before 24 hours if you are not able to make it. If you no-show for 2 appointments then you will be receiving a warning letter. If you no-show after 3 visits, then you may be at risk of being dismissed from our clinic. This is to ensure that everyone is able to be seen in a timely manner. Thank you, we appreciate your assistance with this!

## 2022-08-30 NOTE — Progress Notes (Signed)
    SUBJECTIVE:   CHIEF COMPLAINT / HPI:   Patient presents for follow up of her incontinence, still having an increased urinary frequency which has been ongoing for the past few months but started a year ago. Denies fever, chills, dysuria, abdominal pain, back pain and hematuria. Gets incontinent when she has a series of sneezes, laughs or coughs and when she needs to go frequently. She has been drinking the same amount as she had during our last visit, she feels like she is not drinking enough. Drinks about 3-4 glasses of water a day and 2 cups of coffee (20 ounces total). Given Kegel exercises at the last visit and does them intermittently.   OBJECTIVE:   BP 114/68   Pulse 68   Ht 5' 2.5" (1.588 m)   Wt 174 lb (78.9 kg)   SpO2 99%   BMI 31.32 kg/m   General: Patient well-appearing, in no acute distress. CV: RRR, no murmurs or gallops auscultated  Resp: CTAB, no wheezing, rales or rhonchi noted Abdomen: soft, nontender, nondistended, presence of bowel sounds MSK: no CVA tenderness bilaterally   ASSESSMENT/PLAN:   Stress incontinence -extensively discussed conservative measures including limiting coffee intake, hydration and timed toileting -instructed patient to do Kegel exercises more frequently -started myrbetriq daily  -given UA results and patient's symptoms will plan to treat for UTI, keflex sent to desired pharmacy -follow up in 1 month if symptoms still persist or worsen, may consider pelvic PT at that time which was discussed with patient   UTI (urinary tract infection) -UA obtained today and will plan to treat for UTI. No signs of intrinsic renal involvement or pyelonephritis  -keflex 500 mg bid for 7 days, instructed patient to complete course even if symptoms improved     [name], [name]

## 2022-08-30 NOTE — Assessment & Plan Note (Signed)
-  UA obtained today and will plan to treat for UTI. No signs of intrinsic renal involvement or pyelonephritis  -keflex 500 mg bid for 7 days, instructed patient to complete course even if symptoms improved

## 2022-08-30 NOTE — Assessment & Plan Note (Signed)
-  extensively discussed conservative measures including limiting coffee intake, hydration and timed toileting -instructed patient to do Kegel exercises more frequently -started myrbetriq daily  -given UA results and patient's symptoms will plan to treat for UTI, keflex sent to desired pharmacy -follow up in 1 month if symptoms still persist or worsen, may consider pelvic PT at that time which was discussed with patient

## 2022-09-07 ENCOUNTER — Ambulatory Visit: Payer: Medicare HMO | Attending: Internal Medicine | Admitting: Internal Medicine

## 2022-09-07 ENCOUNTER — Encounter: Payer: Self-pay | Admitting: Internal Medicine

## 2022-09-07 VITALS — BP 122/72 | HR 67 | Ht 62.5 in | Wt 173.4 lb

## 2022-09-07 DIAGNOSIS — I5189 Other ill-defined heart diseases: Secondary | ICD-10-CM

## 2022-09-07 DIAGNOSIS — Z79899 Other long term (current) drug therapy: Secondary | ICD-10-CM

## 2022-09-07 DIAGNOSIS — E785 Hyperlipidemia, unspecified: Secondary | ICD-10-CM | POA: Diagnosis not present

## 2022-09-07 DIAGNOSIS — R0609 Other forms of dyspnea: Secondary | ICD-10-CM | POA: Diagnosis not present

## 2022-09-07 DIAGNOSIS — E781 Pure hyperglyceridemia: Secondary | ICD-10-CM

## 2022-09-07 DIAGNOSIS — Z951 Presence of aortocoronary bypass graft: Secondary | ICD-10-CM

## 2022-09-07 DIAGNOSIS — I2510 Atherosclerotic heart disease of native coronary artery without angina pectoris: Secondary | ICD-10-CM

## 2022-09-07 LAB — LIPID PANEL
Chol/HDL Ratio: 2.8 ratio (ref 0.0–4.4)
Cholesterol, Total: 173 mg/dL (ref 100–199)
HDL: 61 mg/dL (ref >39)
LDL Chol Calc (NIH): 80 mg/dL (ref 0–99)
Triglycerides: 195 mg/dL — ABNORMAL HIGH (ref 0–149)
VLDL Cholesterol Cal: 32 mg/dL (ref 5–40)

## 2022-09-07 MED ORDER — METOPROLOL TARTRATE 50 MG PO TABS: 50.0000 mg | ORAL_TABLET | Freq: Two times a day (BID) | ORAL | 3 refills | Status: DC

## 2022-09-07 MED ORDER — ROSUVASTATIN CALCIUM 20 MG PO TABS: 20.0000 mg | ORAL_TABLET | Freq: Every day | ORAL | 3 refills | Status: DC

## 2022-09-07 MED ORDER — NITROGLYCERIN 0.4 MG SL SUBL: 0.4000 mg | SUBLINGUAL_TABLET | SUBLINGUAL | 3 refills | Status: DC | PRN

## 2022-09-07 MED ORDER — FUROSEMIDE 20 MG PO TABS: 20.0000 mg | ORAL_TABLET | Freq: Every day | ORAL | 3 refills | Status: DC

## 2022-09-07 MED ORDER — ISOSORBIDE MONONITRATE ER 30 MG PO TB24: 30.0000 mg | ORAL_TABLET | Freq: Every day | ORAL | 3 refills | Status: DC

## 2022-09-07 NOTE — Patient Instructions (Signed)
Medication Instructions:  No Changes In Medications at this time.  *If you need a refill on your cardiac medications before your next appointment, please call your pharmacy*  Lab Work: BLOOD WORK TODAY  If you have labs (blood work) drawn today and your tests are completely normal, you will receive your results only by: [place] (if you have MyChart) OR A paper copy in the mail If you have any lab test that is abnormal or we need to change your treatment, we will call you to review the results.  Testing/Procedures: Your physician has requested that you have an echocardiogram. Echocardiography is a painless test that uses sound waves to create images of your heart. It provides your doctor with information about the size and shape of your heart and how well your heart's chambers and valves are working. You may receive an ultrasound enhancing agent through an IV if needed to better visualize your heart during the echo.This procedure takes approximately one hour. There are no restrictions for this procedure. This will take place at the [address], [address].   Follow-Up: At [facility], you and your health needs are our priority.  As part of our continuing mission to provide you with exceptional heart care, we have created designated Provider Care Teams.  These Care Teams include your primary Cardiologist (physician) and Advanced Practice Providers (APPs -  Physician Assistants and Nurse Practitioners) who all work together to provide you with the care you need, when you need it.  We recommend signing up for the patient portal called "MyChart".  Sign up information is provided on this After Visit Summary.  MyChart is used to connect with patients for Virtual Visits (Telemedicine).  Patients are able to view lab/test results, encounter notes, upcoming appointments, etc.  Non-urgent messages can be sent to your provider as well.   To learn more about what you can do with MyChart,  go to [email or web address].    Your next appointment:   1 year(s)  Provider:   Elouise Munroe, MD

## 2022-09-07 NOTE — Progress Notes (Signed)
Cardiology Office Note:    Date:  09/07/2022   ID:  Jaime Chavez, DOB 09-19-1940, MRN [id number]  PCP:  [name], DO  Cardiologist:  Elouise Munroe, MD  Electrophysiologist:  None   Referring MD: [name], DO   Chief Complaint/Reason for Referral: CAD s/p CABG 2019, chronic dyspnea on exertion  History of Present Illness:    Jaime Chavez is a 82 y.o. female with a history of CAD s/p CABG x 4 June 14, 2018. She has a history of GERD, HLD.  She has chronic dyspnea on exertion.  I had recommended pulmonary evaluation. Pulmonary evaluation suggests restriction on PFTs, and SOB likely secondary to deconditioning and body habitus.   At her previous visit on 04/15/2021, she was doing well overall. She had recently travelled to [place] and reported that the trip went better than a prior trip to [place] where she felt limited by SOB. Her husband had stated that he thought she did have SOB but this time she took breaks and that was differing factor in how she was feeling. The dyspnea on exertion has been an issue for her since she was a teenager and she noted that she did not feel there was a difference between pre or post coronary revascularization.   She denied chest pain and palpitations. She felt that the metoprolol 50 mg twice daily had been the most helpful therapy.  She reported infrequently having to take nitroglycerin, no more than a few times a year.  Her last episode of chest pressure she took a total of 2 nitroglycerin but did not feel that it was helpful and felt this may have been more GI in origin.  We discussed that when she is ambulating if she has symptoms of chest pressure she is welcome to take a nitroglycerin but I have discussed ED precautions with the patient regarding use of nitro x3 in the setting of significant chest pressure.  Today, she reports that she is doing well overall. She says her granddaughter is getting married next month so they have a lot of family  coming in.   She does get tired at times, but overall she is feeling good. Her SOB has been stable and consistently occurs when she is walking a lot. Her husband mentioned that she does seem to have some " huffing and puffing" when she goes to get the mail. He acknowledges that the driveway is steep. We did discuss the benefits of repeating an echocardiogram since it has been 3 years since the last one.   She is still taking the furosemide 20 mg and says that she feels it has helped a lot with her symptoms. Her energy levels are improved and the SOB is better. She noticed an increase in symptoms when she forgets to take a dose.   Her most recent lab work showed elevated triglyceride levels, but she had not fasted for this blood work. We did discuss repeating this blood work in a fasted state and the need for adjustment of some of her medications if a fasted blood test shows elevated levels. She feels her triglycerides are always high. May recommend targeted therapy.   She denies any palpitations, chest pain, or peripheral edema. No lightheadedness, headaches, syncope, orthopnea, or PND.     Past Medical History:  Diagnosis Date   Acid reflux    Coronary artery disease involving native coronary artery of native heart with angina pectoris ([place]) 06/13/2018   Dyspnea    Environmental allergies  Hyperlipidemia 2014   Left patella fracture 2005   Osteoporosis 08/02/2021   DEXA 07/2021   S/P CABG x 4 06/14/2018   LIMA to LAD, SVG to OM, Sequential SVG to RCA and PDA, EVH from bilateral thighs and RLL    Past Surgical History:  Procedure Laterality Date   CARDIAC CATHETERIZATION  06/13/2018   CORONARY ARTERY BYPASS GRAFT N/A 06/14/2018   Procedure: CORONARY ARTERY BYPASS GRAFTING (CABG) times 4 using left internal mammary artery and right saphenous vein using endoscope.;  Surgeon: [name], MD;  Location: [facility] OR;  Service: Open Heart Surgery;  Laterality: N/A;   DILATION AND  CURETTAGE, DIAGNOSTIC / THERAPEUTIC     for nonmalignant polyp   RIGHT/LEFT HEART CATH AND CORONARY ANGIOGRAPHY N/A 06/13/2018   Procedure: RIGHT/LEFT HEART CATH AND CORONARY ANGIOGRAPHY;  Surgeon: [name], MD;  Location: [facility] INVASIVE CV LAB;  Service: Cardiovascular;  Laterality: N/A;   TEE WITHOUT CARDIOVERSION N/A 06/14/2018   Procedure: TRANSESOPHAGEAL ECHOCARDIOGRAM (TEE);  Surgeon: [name], MD;  Location: [facility] OR;  Service: Open Heart Surgery;  Laterality: N/A;    Current Medications: Current Outpatient Medications on File Prior to Visit  Medication Sig Dispense Refill   acetaminophen (TYLENOL) 325 MG tablet Take 650 mg by mouth every 6 (six) hours as needed.     aspirin EC 81 MG tablet Take 1 tablet (81 mg total) by mouth daily. 60 tablet 2   calcium carbonate (OS-CAL) 600 MG TABS tablet Take 600 mg by mouth 2 (two) times daily with a meal.     cetirizine (ZYRTEC) 10 MG tablet Take 10 mg by mouth daily.     fluticasone (FLONASE) 50 MCG/ACT nasal spray Place into both nostrils as needed.      Glucosamine-Chondroit-Vit C-Mn (GLUCOSAMINE CHONDR 1500 COMPLX) CAPS Take 1 capsule by mouth 2 (two) times daily.     ipratropium (ATROVENT) 0.03 % nasal spray Place 2 sprays into both nostrils 4 (four) times daily as needed for rhinitis. 30 mL 12   mirabegron ER (MYRBETRIQ) 25 MG TB24 tablet Take 1 tablet (25 mg total) by mouth daily. 30 tablet 3   Multiple Vitamins-Minerals (CENTRUM SILVER 50+WOMEN) TABS Take 1 tablet by mouth daily.     omeprazole (PRILOSEC) 20 MG capsule Take 1 capsule (20 mg total) by mouth daily. 30 capsule 3   Current Facility-Administered Medications on File Prior to Visit  Medication Dose Route Frequency Provider Last Rate Last Admin   ipratropium (ATROVENT) 0.03 % nasal spray 2 spray  2 spray Each Nare QID PRN Leslye Peer, MD         Allergies:   Osteoporosis support [a-g pro] and Alendronate sodium   Social History   Tobacco Use   Smoking  status: Never   Smokeless tobacco: Never  Vaping Use   Vaping Use: Never used  Substance Use Topics   Alcohol use: Yes    Comment: wine and liquor, about 1 drink per day   Drug use: Never     Family History: The patient's family history includes Breast cancer in her paternal aunt; Colon cancer in her sister; Heart attack in her father; Other in her father; Pancreatic cancer in her mother.  ROS:   Please see the history of present illness.   (+) fatigue (+) shortness of breathe (exertional)  All other systems reviewed and are negative.  EKGs/Labs/Other Studies Reviewed:    The following studies were reviewed today:  ECHO 03/02/2020:   IMPRESSIONS  1. Left ventricular ejection fraction, by estimation, is 60 to 65%. The  left ventricle has normal function. The left ventricle has no regional  wall motion abnormalities. Left ventricular diastolic parameters are  consistent with Grade I diastolic  dysfunction (impaired relaxation).   2. Right ventricular systolic function is normal. The right ventricular  size is normal. There is normal pulmonary artery systolic pressure.   3. The mitral valve is normal in structure. No evidence of mitral valve  regurgitation. No evidence of mitral stenosis.   4. The aortic valve is tricuspid. Aortic valve regurgitation is trivial.  No aortic stenosis is present.   5. The inferior vena cava is normal in size with greater than 50%  respiratory variability, suggesting right atrial pressure of 3 mmHg.    EKG: EKG is personally reviewed.  09/07/22: NSR, inferior infarct pattern, S-T and T wave abnormality, laterally, poor R wave progression. Overall no significant change since prior.  04/15/2021: NSR, septal infarct pattern  09/24/20 NSR, poor R wave progression.   Recent Labs: 06/12/2022: BUN 12; Creatinine, Ser 0.79; Potassium 4.8; Sodium 143  Recent Lipid Panel    Component Value Date/Time   CHOL 173 09/07/2022 0936   TRIG 195 (H)  09/07/2022 0936   HDL 61 09/07/2022 0936   CHOLHDL 2.8 09/07/2022 0936   LDLCALC 80 09/07/2022 0936    Physical Exam:    VS:  BP 122/72   Pulse 67   Ht 5' 2.5" (1.588 m)   Wt 173 lb 6.4 oz (78.7 kg)   SpO2 96%   BMI 31.21 kg/m     Wt Readings from Last 5 Encounters:  09/07/22 173 lb 6.4 oz (78.7 kg)  08/30/22 174 lb (78.9 kg)  07/18/22 173 lb (78.5 kg)  06/12/22 171 lb (77.6 kg)  04/15/21 169 lb 9.6 oz (76.9 kg)    Constitutional: No acute distress Eyes: sclera non-icteric, normal conjunctiva and lids ENMT: normal dentition, moist mucous membranes Cardiovascular: regular rhythm, normal rate, no murmurs. S1 and S2 normal. Radial pulses normal bilaterally. No jugular venous distention.  Respiratory: clear to auscultation bilaterally GI : normal bowel sounds, soft and nontender. No distention.   MSK: extremities warm, well perfused. No edema.  NEURO: grossly nonfocal exam, moves all extremities. PSYCH: alert and oriented x 3, normal mood and affect.   ASSESSMENT:    1. Diastolic dysfunction   2. Hyperlipidemia, unspecified hyperlipidemia type   3. Dyspnea on exertion   4. Medication management   5. S/P CABG x 4   6. Coronary artery disease involving native coronary artery of native heart without angina pectoris   7. Hypertriglyceridemia    PLAN:    Dyspnea on exertion - Plan: EKG 12-Lead Medication management Diastolic dysfunction -Pulmonary evaluation completed and suggestive of deconditioning and shortness of breath secondary to body habitus. Recommend moderate exercise program, and monitor for chest pain or worsening shortness of breath. -low dose daily lasix helping the most, continue lasix 20 mg daily. - repeat echo to ensure no progression of diastolic dysfunction with continued DOE, though fortunately stable.  S/P CABG x 4 - Plan: EKG 12-Lead Coronary artery disease involving native coronary artery of native heart without angina pectoris - Plan: EKG  12-Lead -Continue aspirin 81 mg daily and Crestor 20 mg daily. -Continue metoprolol tartrate 50 mg twice daily. -Continue Imdur 30 mg daily.  She can continue using sublingual nitro as needed. - needs repeat fasting lipids, if elevated and LDL not at goal, increase crestor to  40 mg daily.   Hypertriglyceridemia - Plan: EKG 12-Lead -Continue Crestor 20 mg daily and dietary modifications.  May need to consider up titration of Crestor for the benefit of her triglycerides versus addition of triglyceride lowering therapy.  Total time of encounter: 35 minutes total time of encounter, including 25 minutes spent in face-to-face patient care on the date of this encounter. This time includes coordination of care and counseling regarding above mentioned problem list. Remainder of non-face-to-face time involved reviewing chart documents/testing relevant to the patient encounter and documentation in the medical record. I have independently reviewed documentation from referring provider.   Weston Brass, MD, [facility] [place]  [facility]   Follow Up: 1 year   Medication Adjustments/Labs and Tests Ordered: Current medicines are reviewed at length with the patient today.  Concerns regarding medicines are outlined above.   Orders Placed This Encounter  Procedures   Lipid panel   EKG 12-Lead   ECHOCARDIOGRAM COMPLETE     Meds ordered this encounter  Medications   furosemide (LASIX) 20 MG tablet    Sig: Take 1 tablet (20 mg total) by mouth daily.    Dispense:  90 tablet    Refill:  3   isosorbide mononitrate (IMDUR) 30 MG 24 hr tablet    Sig: Take 1 tablet (30 mg total) by mouth daily.    Dispense:  90 tablet    Refill:  3   metoprolol tartrate (LOPRESSOR) 50 MG tablet    Sig: Take 1 tablet (50 mg total) by mouth 2 (two) times daily.    Dispense:  180 tablet    Refill:  3   nitroGLYCERIN (NITROSTAT) 0.4 MG SL tablet    Sig: Place 1 tablet (0.4 mg total) under the tongue every 5 (five)  minutes as needed for chest pain.    Dispense:  25 tablet    Refill:  3   rosuvastatin (CRESTOR) 20 MG tablet    Sig: Take 1 tablet (20 mg total) by mouth daily.    Dispense:  90 tablet    Refill:  3     Patient Instructions  Medication Instructions:  No Changes In Medications at this time.  *If you need a refill on your cardiac medications before your next appointment, please call your pharmacy*  Lab Work: BLOOD WORK TODAY  If you have labs (blood work) drawn today and your tests are completely normal, you will receive your results only by: MyChart Message (if you have MyChart) OR A paper copy in the mail If you have any lab test that is abnormal or we need to change your treatment, we will call you to review the results.  Testing/Procedures: Your physician has requested that you have an echocardiogram. Echocardiography is a painless test that uses sound waves to create images of your heart. It provides your doctor with information about the size and shape of your heart and how well your heart's chambers and valves are working. You may receive an ultrasound enhancing agent through an IV if needed to better visualize your heart during the echo.This procedure takes approximately one hour. There are no restrictions for this procedure. This will take place at the [address].   Follow-Up: At [facility], you and your health needs are our priority.  As part of our continuing mission to provide you with exceptional heart care, we have created designated Provider Care Teams.  These Care Teams include your primary Cardiologist (physician) and Advanced Practice  Providers (APPs -  Physician Assistants and Nurse Practitioners) who all work together to provide you with the care you need, when you need it.  We recommend signing up for the patient portal called "MyChart".  Sign up information is provided on this After Visit Summary.  MyChart is used to connect with patients  for Virtual Visits (Telemedicine).  Patients are able to view lab/test results, encounter notes, upcoming appointments, etc.  Non-urgent messages can be sent to your provider as well.   To learn more about what you can do with MyChart, go to [email or web address].    Your next appointment:   1 year(s)  Provider:   Elouise Munroe, MD        Burnett Kanaris Ford,acting as a scribe for Elouise Munroe, MD.,have documented all relevant documentation on the behalf of Elouise Munroe, MD,as directed by  Elouise Munroe, MD while in the presence of Elouise Munroe, MD.   I, Elouise Munroe, MD, have reviewed all documentation for the visit on 09/07/2022. The documentation on today's date of service for the exam, diagnosis, procedures, and orders are all accurate and complete.

## 2022-10-04 ENCOUNTER — Ambulatory Visit (HOSPITAL_COMMUNITY): Payer: Medicare HMO | Attending: Internal Medicine

## 2022-10-04 DIAGNOSIS — I5189 Other ill-defined heart diseases: Secondary | ICD-10-CM | POA: Diagnosis present

## 2022-10-04 LAB — ECHOCARDIOGRAM COMPLETE
Area-P 1/2: 3.53 cm2
S' Lateral: 2.3 cm

## 2022-10-04 MED ORDER — PERFLUTREN LIPID MICROSPHERE
1.0000 mL | INTRAVENOUS | Status: AC | PRN
  Administered 2022-10-04: 2 mL via INTRAVENOUS

## 2022-10-10 DIAGNOSIS — E785 Hyperlipidemia, unspecified: Secondary | ICD-10-CM

## 2022-10-11 MED ORDER — ROSUVASTATIN CALCIUM 40 MG PO TABS: 40.0000 mg | ORAL_TABLET | Freq: Every day | ORAL | 3 refills | Status: DC

## 2022-10-17 ENCOUNTER — Other Ambulatory Visit: Payer: Self-pay

## 2022-10-17 DIAGNOSIS — K219 Gastro-esophageal reflux disease without esophagitis: Secondary | ICD-10-CM

## 2022-10-17 MED ORDER — OMEPRAZOLE 20 MG PO CPDR: 20.0000 mg | DELAYED_RELEASE_CAPSULE | Freq: Every day | ORAL | 3 refills | Status: DC

## 2022-11-06 ENCOUNTER — Telehealth: Payer: Self-pay | Admitting: *Deleted

## 2022-11-06 ENCOUNTER — Telehealth: Payer: Self-pay

## 2022-11-06 NOTE — Telephone Encounter (Signed)
   Pre-operative Risk Assessment    Patient Name: Jaime Chavez  DOB: May 29, 1941 MRN: [id number]    NOTES PER CLEARANCE REQUEST: PATIENT REPORTS 4 VESSEL CABG 2019,WITH MONTHLY USE OF NTG, LAST USED 3 DAYS AGO AND INSTANTLY RELIEVES CHEST PRESSURE. WE WILL REQUEST CARDIAC RISK ASSESSMENT DUE TO EXTENSIVE CARDIAC HISTORY AND RELATIVELY RECENT/FREQUENT USE OF NTG.   Request for Surgical Clearance    Procedure:   CATARACT EXTRACTION BY PE, IOL-RIGHT EYE, FOLLOWED BY LEFT EYE  Date of Surgery:  Clearance 12/07/22                                 Surgeon:  DR. Richmond Campbell Surgeon's Group or Practice Name:  [facility]  Phone number:  [phone] [phone] Fax number:  [phone]   Type of Clearance Requested:   - Medical ; NO MEDICATIONS LISTED AS NEEDING TO BE HELD   Type of Anesthesia:   IV SEDATION   Additional requests/questions:    Jaime Chavez   11/06/2022, 2:30 PM

## 2022-11-06 NOTE — Telephone Encounter (Signed)
Spoke with patient who is agreeable to do a tele visit on 3/25 at 2 pm. Med rec and consent has been done.

## 2022-11-06 NOTE — Telephone Encounter (Signed)
  Patient Consent for Virtual Visit        Jaime Chavez has provided verbal consent on 11/06/2022 for a virtual visit (video or telephone).   CONSENT FOR VIRTUAL VISIT FOR:  Jaime Chavez  By participating in this virtual visit I agree to the following:  I hereby voluntarily request, consent and authorize [facility] and its employed or contracted physicians, physician assistants, nurse practitioners or other licensed health care professionals (the Practitioner), to provide me with telemedicine health care services (the "Services") as deemed necessary by the treating Practitioner. I acknowledge and consent to receive the Services by the Practitioner via telemedicine. I understand that the telemedicine visit will involve communicating with the Practitioner through live audiovisual communication technology and the disclosure of certain medical information by electronic transmission. I acknowledge that I have been given the opportunity to request an in-person assessment or other available alternative prior to the telemedicine visit and am voluntarily participating in the telemedicine visit.  I understand that I have the right to withhold or withdraw my consent to the use of telemedicine in the course of my care at any time, without affecting my right to future care or treatment, and that the Practitioner or I may terminate the telemedicine visit at any time. I understand that I have the right to inspect all information obtained and/or recorded in the course of the telemedicine visit and may receive copies of available information for a reasonable fee.  I understand that some of the potential risks of receiving the Services via telemedicine include:  Delay or interruption in medical evaluation due to technological equipment failure or disruption; Information transmitted may not be sufficient (e.g. poor resolution of images) to allow for appropriate medical decision making by the Practitioner;  and/or  In rare instances, security protocols could fail, causing a breach of personal health information.  Furthermore, I acknowledge that it is my responsibility to provide information about my medical history, conditions and care that is complete and accurate to the best of my ability. I acknowledge that Practitioner's advice, recommendations, and/or decision may be based on factors not within their control, such as incomplete or inaccurate data provided by me or distortions of diagnostic images or specimens that may result from electronic transmissions. I understand that the practice of medicine is not an exact science and that Practitioner makes no warranties or guarantees regarding treatment outcomes. I acknowledge that a copy of this consent can be made available to me via my patient portal ([facility]), or I can request a printed copy by calling the office of [facility].    I understand that my insurance will be billed for this visit.   I have read or had this consent read to me. I understand the contents of this consent, which adequately explains the benefits and risks of the Services being provided via telemedicine.  I have been provided ample opportunity to ask questions regarding this consent and the Services and have had my questions answered to my satisfaction. I give my informed consent for the services to be provided through the use of telemedicine in my medical care

## 2022-11-06 NOTE — Telephone Encounter (Signed)
   Name: Jaime Chavez  DOB: May 09, 1941  MRN: [id number]  Primary Cardiologist: Elouise Munroe, MD   Preoperative team, please contact this patient and set up a phone call appointment for further preoperative risk assessment. Please obtain consent and complete medication review. Thank you for your help.  Patient has upcoming cataract surgery, which would normally not require a virtual visit. However, requesting office reports:  "NOTES PER CLEARANCE REQUEST: PATIENT REPORTS 4 VESSEL CABG 2019,WITH MONTHLY USE OF NTG, LAST USED 3 DAYS AGO AND INSTANTLY RELIEVES CHEST PRESSURE. WE WILL REQUEST CARDIAC RISK ASSESSMENT DUE TO EXTENSIVE CARDIAC HISTORY AND RELATIVELY RECENT/FREQUENT USE OF NTG."  Therefore will check in with patient to determine if she needs an office visit.    Mayra Reel, NP 11/06/2022, 3:04 PM Jud

## 2022-11-13 ENCOUNTER — Ambulatory Visit: Payer: Medicare HMO | Attending: Cardiovascular Disease | Admitting: Nurse Practitioner

## 2022-11-13 ENCOUNTER — Encounter: Payer: Self-pay | Admitting: Nurse Practitioner

## 2022-11-13 DIAGNOSIS — Z01810 Encounter for preprocedural cardiovascular examination: Secondary | ICD-10-CM | POA: Diagnosis not present

## 2022-11-13 NOTE — Progress Notes (Signed)
Virtual Visit via Telephone Note   Because of [place] co-morbid illnesses, she is at least at moderate risk for complications without adequate follow up.  This format is felt to be most appropriate for this patient at this time.  The patient did not have access to video technology/had technical difficulties with video requiring transitioning to audio format only (telephone).  All issues noted in this document were discussed and addressed.  No physical exam could be performed with this format.  Please refer to the patient's chart for her consent to telehealth for [facility].  Evaluation Performed:  Preoperative cardiovascular risk assessment _____________   Date:  11/13/2022   Patient ID:  Jaime Chavez, DOB 1940/09/12, MRN [id number] Patient Location:  Home Provider location:   Office  Primary Care Provider:  [name], DO Primary Cardiologist:  Elouise Munroe, MD  Chief Complaint / Patient Profile   82 y.o. y/o female with a h/o CAD s/p CABG x 4 in 2019, chronic DOE, chronic HFpEF, HLD, GERD, with echocardiogram 10/04/22 that revealed normal LVEF 60-65%, G1DD, normal RV, no significant valve disease who is pending cataract surgery and presents today for telephonic preoperative cardiovascular risk assessment.  History of Present Illness    Jaime Chavez is a 82 y.o. female who presents via audio/video conferencing for a telehealth visit today.  Pt was last seen in cardiology clinic on 09/07/22 by Dr. Margaretann Loveless.  At that time [name] was doing well.  The patient is now pending procedure as outlined above. Since her last visit, she denies chest pain, shortness of breath, lower extremity edema, fatigue, palpitations, melena, hematuria, hemoptysis, diaphoresis, weakness, presyncope, syncope, orthopnea, and PND. She walks 5000-6000 steps daily and also works out in her yard without concerning cardiac symptoms. She takes SL NTG about 1-2 times per month. Pain generally  resolves quickly.   Past Medical History    Past Medical History:  Diagnosis Date   Acid reflux    Coronary artery disease involving native coronary artery of native heart with angina pectoris ([place]) 06/13/2018   Dyspnea    Environmental allergies    Hyperlipidemia 2014   Left patella fracture 2005   Osteoporosis 08/02/2021   DEXA 07/2021   S/P CABG x 4 06/14/2018   LIMA to LAD, SVG to OM, Sequential SVG to RCA and PDA, EVH from bilateral thighs and RLL   Past Surgical History:  Procedure Laterality Date   CARDIAC CATHETERIZATION  06/13/2018   CORONARY ARTERY BYPASS GRAFT N/A 06/14/2018   Procedure: CORONARY ARTERY BYPASS GRAFTING (CABG) times 4 using left internal mammary artery and right saphenous vein using endoscope.;  Surgeon: Rexene Alberts, MD;  Location: [facility];  Service: Open Heart Surgery;  Laterality: N/A;   DILATION AND CURETTAGE, DIAGNOSTIC / THERAPEUTIC     for nonmalignant polyp   RIGHT/LEFT HEART CATH AND CORONARY ANGIOGRAPHY N/A 06/13/2018   Procedure: RIGHT/LEFT HEART CATH AND CORONARY ANGIOGRAPHY;  Surgeon: Nelva Bush, MD;  Location: [facility];  Service: Cardiovascular;  Laterality: N/A;   TEE WITHOUT CARDIOVERSION N/A 06/14/2018   Procedure: TRANSESOPHAGEAL ECHOCARDIOGRAM (TEE);  Surgeon: Rexene Alberts, MD;  Location: [facility];  Service: Open Heart Surgery;  Laterality: N/A;    Allergies  Allergies  Allergen Reactions   Osteoporosis Support [A-G Pro] Other (See Comments)    Patient that she started having severe pain and back spasms.    Alendronate Sodium Other (See Comments)    Back pain, muscle spasms  Home Medications    Prior to Admission medications   Medication Sig Start Date End Date Taking? Authorizing Provider  acetaminophen (TYLENOL) 325 MG tablet Take 650 mg by mouth every 6 (six) hours as needed.    [provider]  aspirin EC 81 MG tablet Take 1 tablet (81 mg total) by mouth daily. 05/09/18   Matilde Haymaker, MD   calcium carbonate (OS-CAL) 600 MG TABS tablet Take 600 mg by mouth 2 (two) times daily with a meal.    [provider]  cetirizine (ZYRTEC) 10 MG tablet Take 10 mg by mouth as needed for allergies.    [provider]  fluticasone (FLONASE) 50 MCG/ACT nasal spray Place into both nostrils as needed.     [provider]  furosemide (LASIX) 20 MG tablet Take 1 tablet (20 mg total) by mouth daily. 09/07/22   Elouise Munroe, MD  Glucosamine-Chondroit-Vit C-Mn (GLUCOSAMINE CHONDR 1500 COMPLX) CAPS Take 1 capsule by mouth 2 (two) times daily.    [provider]  ipratropium (ATROVENT) 0.03 % nasal spray Place 2 sprays into both nostrils 4 (four) times daily as needed for rhinitis. 06/09/20   Collene Gobble, MD  isosorbide mononitrate (IMDUR) 30 MG 24 hr tablet Take 1 tablet (30 mg total) by mouth daily. 09/07/22   Elouise Munroe, MD  metoprolol tartrate (LOPRESSOR) 50 MG tablet Take 1 tablet (50 mg total) by mouth 2 (two) times daily. 09/07/22   Elouise Munroe, MD  mirabegron ER (MYRBETRIQ) 25 MG TB24 tablet Take 1 tablet (25 mg total) by mouth daily. Patient not taking: Reported on 11/06/2022 08/30/22   [name], DO  Multiple Vitamins-Minerals (CENTRUM SILVER 50+WOMEN) TABS Take 1 tablet by mouth daily.    [provider]  nitroGLYCERIN (NITROSTAT) 0.4 MG SL tablet Place 1 tablet (0.4 mg total) under the tongue every 5 (five) minutes as needed for chest pain. 09/07/22 12/06/22  Elouise Munroe, MD  omeprazole (PRILOSEC) 20 MG capsule Take 1 capsule (20 mg total) by mouth daily. Patient not taking: Reported on 11/06/2022 10/17/22   [name], DO  rosuvastatin (CRESTOR) 40 MG tablet Take 1 tablet (40 mg total) by mouth daily. 10/11/22   Elouise Munroe, MD    Physical Exam    Vital Signs:  Jaime Chavez does not have vital signs available for review today.  Given telephonic nature of communication, physical exam is limited. AAOx3. NAD. Normal  affect.  Speech and respirations are unlabored.  Accessory Clinical Findings    None  Assessment & Plan    1.  Preoperative Cardiovascular Risk Assessment: According to the Revised Cardiac Risk Index (RCRI), her Perioperative Risk of Major Cardiac Event is (%): 6.6. Her Functional Capacity in METs is: 6.61 according to the [place] Activity Status Index (DASI).  The patient was advised that if she develops new symptoms prior to surgery to contact our office to arrange for a follow-up visit, and she verbalized understanding.  Cataract extractions are recognized in guidelines as low risk surgeries that do not typically require holding of blood thinner therapy.   A copy of this note will be routed to requesting surgeon.  Time:   Today, I have spent 10 minutes with the patient with telehealth technology discussing medical history, symptoms, and management plan.    Emmaline Life, NP-C  11/13/2022, 2:03 PM [address], [address] Office [phone] Fax [phone]

## 2022-12-05 ENCOUNTER — Telehealth: Payer: Self-pay | Admitting: *Deleted

## 2022-12-05 NOTE — Telephone Encounter (Signed)
   Patient Name: Jaime Chavez  DOB: 09-06-1940 MRN: [id number]  Primary Cardiologist: Parke Poisson, MD  Chart reviewed as part of pre-operative protocol coverage. Cataract extractions are recognized in guidelines as low risk surgeries that do not typically require specific preoperative testing or holding of blood thinner therapy. Therefore, given past medical history and time since last visit, based on ACC/AHA guidelines, Jenesys Casseus would be at acceptable risk for the planned procedure without further cardiovascular testing.   I will route this recommendation to the requesting party via Epic fax function and remove from pre-op pool.  Please call with questions.  Sharlene Dory, PA-C 12/05/2022, 2:39 PM

## 2022-12-05 NOTE — Telephone Encounter (Signed)
   Pre-operative Risk Assessment    Patient Name: Jaime Chavez  DOB: 08/13/1941 MRN: [id number]      Request for Surgical Clearance    Procedure:   CATARACT EXTRACTION BY PE, IOL - LEFT  Date of Surgery:  Clearance 12/18/22                                 Surgeon:  DR. Haynes Hoehn Surgeon's Group or Practice Name:  [facility] Phone number:  [phone] Fax number:  [phone]   Type of Clearance Requested:   - Medical    Type of Anesthesia:   IV SEDATION   Additional requests/questions:    Wilhemina Cash   12/05/2022, 1:36 PM

## 2023-02-20 ENCOUNTER — Ambulatory Visit (INDEPENDENT_AMBULATORY_CARE_PROVIDER_SITE_OTHER): Payer: Medicare HMO | Admitting: *Deleted

## 2023-02-20 DIAGNOSIS — Z0000 Encounter for general adult medical examination without abnormal findings: Secondary | ICD-10-CM | POA: Diagnosis not present

## 2023-02-20 DIAGNOSIS — Z1231 Encounter for screening mammogram for malignant neoplasm of breast: Secondary | ICD-10-CM

## 2023-02-20 DIAGNOSIS — Z780 Asymptomatic menopausal state: Secondary | ICD-10-CM

## 2023-02-20 NOTE — Patient Instructions (Signed)
Jaime Chavez , Thank you for taking time to come for your Medicare Wellness Visit. I appreciate your ongoing commitment to your health goals. Please review the following plan we discussed and let me know if I can assist you in the future.   Screening recommendations/referrals: Colonoscopy: no longer required Mammogram: Education provided Bone Density: Education provided Recommended yearly ophthalmology/optometry visit for glaucoma screening and checkup Recommended yearly dental visit for hygiene and checkup  Vaccinations: Influenza vaccine: declined Pneumococcal vaccine: Education provided Tdap vaccine: up to date Shingles vaccine: Education provided    Advanced directives: yes not on file     Preventive Care 65 Years and Older, Female Preventive care refers to lifestyle choices and visits with your health care provider that can promote health and wellness. What does preventive care include? A yearly physical exam. This is also called an annual well check. Dental exams once or twice a year. Routine eye exams. Ask your health care provider how often you should have your eyes checked. Personal lifestyle choices, including: Daily care of your teeth and gums. Regular physical activity. Eating a healthy diet. Avoiding tobacco and drug use. Limiting alcohol use. Practicing safe sex. Taking low-dose aspirin every day. Taking vitamin and mineral supplements as recommended by your health care provider. What happens during an annual well check? The services and screenings done by your health care provider during your annual well check will depend on your age, overall health, lifestyle risk factors, and family history of disease. Counseling  Your health care provider may ask you questions about your: Alcohol use. Tobacco use. Drug use. Emotional well-being. Home and relationship well-being. Sexual activity. Eating habits. History of falls. Memory and ability to understand  (cognition). Work and work Astronomer. Reproductive health. Screening  You may have the following tests or measurements: Height, weight, and BMI. Blood pressure. Lipid and cholesterol levels. These may be checked every 5 years, or more frequently if you are over 102 years old. Skin check. Lung cancer screening. You may have this screening every year starting at age 55 if you have a 30-pack-year history of smoking and currently smoke or have quit within the past 15 years. Fecal occult blood test (FOBT) of the stool. You may have this test every year starting at age 74. Flexible sigmoidoscopy or colonoscopy. You may have a sigmoidoscopy every 5 years or a colonoscopy every 10 years starting at age 110. Hepatitis C blood test. Hepatitis B blood test. Sexually transmitted disease (STD) testing. Diabetes screening. This is done by checking your blood sugar (glucose) after you have not eaten for a while (fasting). You may have this done every 1-3 years. Bone density scan. This is done to screen for osteoporosis. You may have this done starting at age 92. Mammogram. This may be done every 1-2 years. Talk to your health care provider about how often you should have regular mammograms. Talk with your health care provider about your test results, treatment options, and if necessary, the need for more tests. Vaccines  Your health care provider may recommend certain vaccines, such as: Influenza vaccine. This is recommended every year. Tetanus, diphtheria, and acellular pertussis (Tdap, Td) vaccine. You may need a Td booster every 10 years. Zoster vaccine. You may need this after age 53. Pneumococcal 13-valent conjugate (PCV13) vaccine. One dose is recommended after age 67. Pneumococcal polysaccharide (PPSV23) vaccine. One dose is recommended after age 7. Talk to your health care provider about which screenings and vaccines you need and how often you need  them. This information is not intended to  replace advice given to you by your health care provider. Make sure you discuss any questions you have with your health care provider. Document Released: 09/03/2015 Document Revised: 04/26/2016 Document Reviewed: 06/08/2015 Elsevier Interactive Patient Education  2017 [place] Prevention in the Home Falls can cause injuries. They can happen to people of all ages. There are many things you can do to make your home safe and to help prevent falls. What can I do on the outside of my home? Regularly fix the edges of walkways and driveways and fix any cracks. Remove anything that might make you trip as you walk through a door, such as a raised step or threshold. Trim any bushes or trees on the path to your home. Use bright outdoor lighting. Clear any walking paths of anything that might make someone trip, such as rocks or tools. Regularly check to see if handrails are loose or broken. Make sure that both sides of any steps have handrails. Any raised decks and porches should have guardrails on the edges. Have any leaves, snow, or ice cleared regularly. Use sand or salt on walking paths during winter. Clean up any spills in your garage right away. This includes oil or grease spills. What can I do in the bathroom? Use night lights. Install grab bars by the toilet and in the tub and shower. Do not use towel bars as grab bars. Use non-skid mats or decals in the tub or shower. If you need to sit down in the shower, use a plastic, non-slip stool. Keep the floor dry. Clean up any water that spills on the floor as soon as it happens. Remove soap buildup in the tub or shower regularly. Attach bath mats securely with double-sided non-slip rug tape. Do not have throw rugs and other things on the floor that can make you trip. What can I do in the bedroom? Use night lights. Make sure that you have a light by your bed that is easy to reach. Do not use any sheets or blankets that are too big for  your bed. They should not hang down onto the floor. Have a firm chair that has side arms. You can use this for support while you get dressed. Do not have throw rugs and other things on the floor that can make you trip. What can I do in the kitchen? Clean up any spills right away. Avoid walking on wet floors. Keep items that you use a lot in easy-to-reach places. If you need to reach something above you, use a strong step stool that has a grab bar. Keep electrical cords out of the way. Do not use floor polish or wax that makes floors slippery. If you must use wax, use non-skid floor wax. Do not have throw rugs and other things on the floor that can make you trip. What can I do with my stairs? Do not leave any items on the stairs. Make sure that there are handrails on both sides of the stairs and use them. Fix handrails that are broken or loose. Make sure that handrails are as long as the stairways. Check any carpeting to make sure that it is firmly attached to the stairs. Fix any carpet that is loose or worn. Avoid having throw rugs at the top or bottom of the stairs. If you do have throw rugs, attach them to the floor with carpet tape. Make sure that you have a light switch at  the top of the stairs and the bottom of the stairs. If you do not have them, ask someone to add them for you. What else can I do to help prevent falls? Wear shoes that: Do not have high heels. Have rubber bottoms. Are comfortable and fit you well. Are closed at the toe. Do not wear sandals. If you use a stepladder: Make sure that it is fully opened. Do not climb a closed stepladder. Make sure that both sides of the stepladder are locked into place. Ask someone to hold it for you, if possible. Clearly mark and make sure that you can see: Any grab bars or handrails. First and last steps. Where the edge of each step is. Use tools that help you move around (mobility aids) if they are needed. These  include: Canes. Walkers. Scooters. Crutches. Turn on the lights when you go into a dark area. Replace any light bulbs as soon as they burn out. Set up your furniture so you have a clear path. Avoid moving your furniture around. If any of your floors are uneven, fix them. If there are any pets around you, be aware of where they are. Review your medicines with your doctor. Some medicines can make you feel dizzy. This can increase your chance of falling. Ask your doctor what other things that you can do to help prevent falls. This information is not intended to replace advice given to you by your health care provider. Make sure you discuss any questions you have with your health care provider. Document Released: 06/03/2009 Document Revised: 01/13/2016 Document Reviewed: 09/11/2014 Elsevier Interactive Patient Education  2017 [employer].

## 2023-02-20 NOTE — Progress Notes (Cosign Needed)
Subjective:   Jaime Chavez is a 82 y.o. female who presents for an Initial Medicare Annual Wellness Visit.  Visit Complete: Virtual  I connected with  Jaime Chavez on 02/20/23 by a audio enabled telemedicine application and verified that I am speaking with the correct person using two identifiers.  Patient Location: Home  Provider Location: Home Office  I discussed the limitations of evaluation and management by telemedicine. The patient expressed understanding and agreed to proceed.    Review of Systems     Cardiac Risk Factors include: advanced age (>54men, >57 women);hypertension     Objective:    Today's Vitals   There is no height or weight on file to calculate BMI.     02/20/2023    8:34 AM 08/30/2022    8:31 AM 07/18/2022    1:37 PM 06/12/2022    4:09 PM 02/10/2021    2:05 PM 01/28/2021   11:10 AM 06/13/2018   10:44 AM  Advanced Directives  Does Patient Have a Medical Advance Directive? Yes Yes Yes No No;Yes No Yes  Type of Sales promotion account executive of [employer] Power of [place];Living will  Living will  Healthcare Power of [place];Living will  Does patient want to make changes to medical advance directive?     No - Patient declined  No - Patient declined  Copy of Healthcare Power of Attorney in Chart? No - copy requested No - copy requested No - copy requested    No - copy requested  Would patient like information on creating a medical advance directive? No - Patient declined  No - Patient declined No - Patient declined No - Patient declined No - Patient declined     Current Medications (verified) Outpatient Encounter Medications as of 02/20/2023  Medication Sig   acetaminophen (TYLENOL) 325 MG tablet Take 650 mg by mouth every 6 (six) hours as needed.   aspirin EC 81 MG tablet Take 1 tablet (81 mg total) by mouth daily.   calcium carbonate (OS-CAL) 600 MG TABS tablet Take 600 mg by mouth 2 (two) times daily with a  meal.   cetirizine (ZYRTEC) 10 MG tablet Take 10 mg by mouth as needed for allergies.   fluticasone (FLONASE) 50 MCG/ACT nasal spray Place into both nostrils as needed.    furosemide (LASIX) 20 MG tablet Take 1 tablet (20 mg total) by mouth daily.   Glucosamine-Chondroit-Vit C-Mn (GLUCOSAMINE CHONDR 1500 COMPLX) CAPS Take 1 capsule by mouth 2 (two) times daily.   ipratropium (ATROVENT) 0.03 % nasal spray Place 2 sprays into both nostrils 4 (four) times daily as needed for rhinitis.   isosorbide mononitrate (IMDUR) 30 MG 24 hr tablet Take 1 tablet (30 mg total) by mouth daily.   metoprolol tartrate (LOPRESSOR) 50 MG tablet Take 1 tablet (50 mg total) by mouth 2 (two) times daily.   Multiple Vitamins-Minerals (CENTRUM SILVER 50+WOMEN) TABS Take 1 tablet by mouth daily.   omeprazole (PRILOSEC) 20 MG capsule Take 1 capsule (20 mg total) by mouth daily.   rosuvastatin (CRESTOR) 40 MG tablet Take 1 tablet (40 mg total) by mouth daily.   nitroGLYCERIN (NITROSTAT) 0.4 MG SL tablet Place 1 tablet (0.4 mg total) under the tongue every 5 (five) minutes as needed for chest pain.   Facility-Administered Encounter Medications as of 02/20/2023  Medication   ipratropium (ATROVENT) 0.03 % nasal spray 2 spray    Allergies (verified) Osteoporosis support [a-g pro] and Alendronate sodium   History:  Past Medical History:  Diagnosis Date   Acid reflux    Coronary artery disease involving native coronary artery of native heart with angina pectoris (HCC) 06/13/2018   Dyspnea    Environmental allergies    Hyperlipidemia 2014   Left patella fracture 2005   Osteoporosis 08/02/2021   DEXA 07/2021   S/P CABG x 4 06/14/2018   LIMA to LAD, SVG to OM, Sequential SVG to RCA and PDA, EVH from bilateral thighs and RLL   Past Surgical History:  Procedure Laterality Date   CARDIAC CATHETERIZATION  06/13/2018   CORONARY ARTERY BYPASS GRAFT N/A 06/14/2018   Procedure: CORONARY ARTERY BYPASS GRAFTING (CABG) times 4  using left internal mammary artery and right saphenous vein using endoscope.;  Surgeon: Jaime Nails, MD;  Location: [facility] OR;  Service: Open Heart Surgery;  Laterality: N/A;   DILATION AND CURETTAGE, DIAGNOSTIC / THERAPEUTIC     for nonmalignant polyp   RIGHT/LEFT HEART CATH AND CORONARY ANGIOGRAPHY N/A 06/13/2018   Procedure: RIGHT/LEFT HEART CATH AND CORONARY ANGIOGRAPHY;  Surgeon: Jaime Kendall, MD;  Location: [facility] INVASIVE CV LAB;  Service: Cardiovascular;  Laterality: N/A;   TEE WITHOUT CARDIOVERSION N/A 06/14/2018   Procedure: TRANSESOPHAGEAL ECHOCARDIOGRAM (TEE);  Surgeon: Jaime Nails, MD;  Location: [facility] OR;  Service: Open Heart Surgery;  Laterality: N/A;   Family History  Problem Relation Age of Onset   Pancreatic cancer Mother    Heart attack Father        age 4, had 3 heart attacks from blood clots    Other Father        clotting disorder   Colon cancer Sister    Breast cancer Paternal Aunt    Social History   Socioeconomic History   Marital status: Married    Spouse name: Not on file   Number of children: Not on file   Years of education: high school graduate   Highest education level: Not on file  Occupational History   Occupation: retired  Tobacco Use   Smoking status: Never   Smokeless tobacco: Never  Vaping Use   Vaping Use: Never used  Substance and Sexual Activity   Alcohol use: Yes    Comment: wine and liquor, about 1 drink per day   Drug use: Never   Sexual activity: Not Currently  Other Topics Concern   Not on file  Social History Narrative   Lives with husband Jaime Chavez and dog named Jaime Chavez.   Religious or personal believes: [employer]   Has an advance directive, would want her husband to make medical decisions for her if she were unable to do so.   Does not exercise regularly.  Does walk sometimes.   For fun she likes to eat out, travel, spent time with family.   Social Determinants of Health   Financial Resource Strain: Low Risk  (02/20/2023)    Overall Financial Resource Strain (CARDIA)    Difficulty of Paying Living Expenses: Not hard at all  Food Insecurity: No Food Insecurity (02/20/2023)   Hunger Vital Sign    Worried About Running Out of Food in the Last Year: Never true    Ran Out of Food in the Last Year: Never true  Transportation Needs: No Transportation Needs (02/20/2023)   PRAPARE - Administrator, Civil Service (Medical): No    Lack of Transportation (Non-Medical): No  Physical Activity: Inactive (02/20/2023)   Exercise Vital Sign    Days of Exercise per Week: 0 days  Minutes of Exercise per Session: 0 min  Stress: No Stress Concern Present (02/20/2023)   [employer] of Occupational Health - Occupational Stress Questionnaire    Feeling of Stress : Not at all  Social Connections: Moderately Isolated (02/20/2023)   Social Connection and Isolation Panel [NHANES]    Frequency of Communication with Friends and Family: Three times a week    Frequency of Social Gatherings with Friends and Family: Once a week    Attends Religious Services: Never    Database administrator or Organizations: No    Attends Engineer, structural: Never    Marital Status: Married    Tobacco Counseling Counseling given: Not Answered   Clinical Intake:  Pre-visit preparation completed: Yes        Nutritional Risks: None Diabetes: No  How often do you need to have someone help you when you read instructions, pamphlets, or other written materials from your doctor or pharmacy?: 1 - Never  Interpreter Needed?: No  Information entered by :: Remi Haggard LPN   Activities of Daily Living    02/20/2023    8:35 AM  In your present state of health, do you have any difficulty performing the following activities:  Hearing? 0  Vision? 0  Difficulty concentrating or making decisions? 0  Walking or climbing stairs? 0  Dressing or bathing? 0  Doing errands, shopping? 0  Preparing Food and eating ? N  Using the  Toilet? N  In the past six months, have you accidently leaked urine? N  Do you have problems with loss of bowel control? N  Managing your Medications? N  Managing your Finances? N  Housekeeping or managing your Housekeeping? N    Patient Care Team: Para March, DO as PCP - General (Family Medicine) Parke Poisson, MD as PCP - Cardiology (Cardiology)  Indicate any recent Medical Services you may have received from other than [facility] providers in the past year (date may be approximate).     Assessment:   This is a routine wellness examination for [name].  Hearing/Vision screen Hearing Screening - Comments:: No trouble hearing Vision Screening - Comments:: Cataract surgery My eye doctor  Dietary issues and exercise activities discussed:     Goals Addressed             This Visit's Progress    Patient Stated       More traveling       Depression Screen    02/20/2023    8:39 AM 08/30/2022    8:31 AM 07/18/2022    1:37 PM 06/12/2022    4:08 PM 05/09/2018   10:19 AM 02/25/2018    3:28 PM  PHQ 2/9 Scores  PHQ - 2 Score 0 0 0 0 0 0  PHQ- 9 Score 0  4 4      Fall Risk    02/20/2023    8:35 AM 08/30/2022    8:31 AM 07/18/2022    1:37 PM 06/12/2022    4:09 PM 01/28/2021   11:09 AM  Fall Risk   Falls in the past year? 0 0 0 0 0  Number falls in past yr: 0  0 0 0  Injury with Fall? 0  0 0 0  Follow up Falls evaluation completed;Education provided;Falls prevention discussed  Falls evaluation completed      MEDICARE RISK AT HOME:  Medicare Risk at Home - 02/20/23 0835     Any stairs in or around the home? No  If so, are there any without handrails? No    Home free of loose throw rugs in walkways, pet beds, electrical cords, etc? Yes    Adequate lighting in your home to reduce risk of falls? Yes    Life alert? No    Use of a cane, walker or w/c? No    Grab bars in the bathroom? No    Shower chair or bench in shower? Yes    Elevated toilet seat or a  handicapped toilet? No             TIMED UP AND GO:  Was the test performed? No    Cognitive Function:        02/20/2023    8:37 AM  6CIT Screen  What Year? 0 points  What month? 0 points  What time? 0 points  Count back from 20 0 points  Months in reverse 0 points  Repeat phrase 0 points  Total Score 0 points    Immunizations Immunization History  Administered Date(s) Administered   Pneumococcal Polysaccharide-23 03/16/2012, 01/28/2021   Tdap 03/16/2016   Zoster, Live 03/16/2012    TDAP status: Up to date  Flu Vaccine status: Up to date  Pneumococcal vaccine status: Up to date  Covid-19 vaccine status: Information provided on how to obtain vaccines.   Qualifies for Shingles Vaccine? Yes   Zostavax completed No   Shingrix Completed?: No.    Education has been provided regarding the importance of this vaccine. Patient has been advised to call insurance company to determine out of pocket expense if they have not yet received this vaccine. Advised may also receive vaccine at local pharmacy or Health Dept. Verbalized acceptance and understanding.  Screening Tests Health Maintenance  Topic Date Due   COVID-19 Vaccine (1) 03/08/2023 (Originally 08/16/1946)   Zoster Vaccines- Shingrix (1 of 2) 05/23/2023 (Originally 08/16/1960)   Pneumonia Vaccine 65+ Years old (2 of 2 - PCV) 02/20/2024 (Originally 01/28/2022)   INFLUENZA VACCINE  03/22/2023   Medicare Annual Wellness (AWV)  02/20/2024   DTaP/Tdap/Td (2 - Td or Tdap) 03/16/2026   DEXA SCAN  Completed   Hepatitis C Screening  Completed   HPV VACCINES  Aged Out    Health Maintenance  There are no preventive care reminders to display for this patient.   Colorectal cancer screening: No longer required.   Mammogram status: Ordered  . Pt provided with contact info and advised to call to schedule appt.   Bone Density status: Ordered  . Pt provided with contact info and advised to call to schedule appt.  Lung  Cancer Screening: (Low Dose CT Chest recommended if Age 19-80 years, 20 pack-year currently smoking OR have quit w/in 15years.) does not qualify.   Lung Cancer Screening Referral:   Additional Screening:  Hepatitis C Screening: does not qualify; Completed 2022  Vision Screening: Recommended annual ophthalmology exams for early detection of glaucoma and other disorders of the eye. Is the patient up to date with their annual eye exam?  Yes  Who is the provider or what is the name of the office in which the patient attends annual eye exams? My eye doctor If pt is not established with a provider, would they like to be referred to a provider to establish care? No .   Dental Screening: Recommended annual dental exams for proper oral hygiene    Community Resource Referral / Chronic Care Management: CRR required this visit?  No   CCM required this visit?  No     Plan:     I have personally reviewed and noted the following in the patient's chart:   Medical and social history Use of alcohol, tobacco or illicit drugs  Current medications and supplements including opioid prescriptions. Patient is not currently taking opioid prescriptions. Functional ability and status Nutritional status Physical activity Advanced directives List of other physicians Hospitalizations, surgeries, and ER visits in previous 12 months Vitals Screenings to include cognitive, depression, and falls Referrals and appointments  In addition, I have reviewed and discussed with patient certain preventive protocols, quality metrics, and best practice recommendations. A written personalized care plan for preventive services as well as general preventive health recommendations were provided to patient.     Remi Haggard, LPN   08/26/1094   After Visit Summary: (MyChart) Due to this being a telephonic visit, the after visit summary with patients personalized plan was offered to patient via MyChart   Nurse Notes:

## 2023-02-21 ENCOUNTER — Encounter: Payer: Self-pay | Admitting: *Deleted

## 2023-02-25 ENCOUNTER — Encounter: Payer: Self-pay | Admitting: Internal Medicine

## 2023-02-26 ENCOUNTER — Other Ambulatory Visit: Payer: Self-pay | Admitting: *Deleted

## 2023-02-26 DIAGNOSIS — E785 Hyperlipidemia, unspecified: Secondary | ICD-10-CM

## 2023-02-27 ENCOUNTER — Encounter: Payer: Self-pay | Admitting: Internal Medicine

## 2023-02-28 ENCOUNTER — Encounter: Payer: Self-pay | Admitting: Internal Medicine

## 2023-02-28 LAB — LIPID PANEL
Chol/HDL Ratio: 3.1 ratio (ref 0.0–4.4)
Cholesterol, Total: 155 mg/dL (ref 100–199)
HDL: 50 mg/dL (ref >39)
LDL Chol Calc (NIH): 68 mg/dL (ref 0–99)
Triglycerides: 228 mg/dL — ABNORMAL HIGH (ref 0–149)
VLDL Cholesterol Cal: 37 mg/dL (ref 5–40)

## 2023-02-28 LAB — HEPATIC FUNCTION PANEL
ALT: 50 IU/L — ABNORMAL HIGH (ref 0–32)
AST: 48 IU/L — ABNORMAL HIGH (ref 0–40)
Albumin: 4.3 g/dL (ref 3.8–4.8)
Alkaline Phosphatase: 115 IU/L (ref 44–121)
Bilirubin Total: 0.4 mg/dL (ref 0.0–1.2)
Bilirubin, Direct: 0.13 mg/dL (ref 0.00–0.40)
Total Protein: 6.5 g/dL (ref 6.0–8.5)

## 2023-03-02 ENCOUNTER — Encounter: Payer: Self-pay | Admitting: Family Medicine

## 2023-03-02 DIAGNOSIS — K219 Gastro-esophageal reflux disease without esophagitis: Secondary | ICD-10-CM

## 2023-03-02 MED ORDER — OMEPRAZOLE 20 MG PO CPDR: 20.0000 mg | DELAYED_RELEASE_CAPSULE | Freq: Every day | ORAL | 3 refills | Status: DC

## 2023-03-13 ENCOUNTER — Ambulatory Visit
Admission: RE | Admit: 2023-03-13 | Discharge: 2023-03-13 | Disposition: A | Discharge status: Home / Self Care | Payer: Medicare HMO | Source: Ambulatory Visit | Attending: Family Medicine | Admitting: Family Medicine

## 2023-04-03 ENCOUNTER — Telehealth: Payer: Self-pay

## 2023-04-03 NOTE — Telephone Encounter (Signed)
LVM for patient to call back [phone], or to call PCP office to schedule follow up apt. AS, CMA  

## 2023-04-25 ENCOUNTER — Ambulatory Visit
Admission: RE | Admit: 2023-04-25 | Discharge: 2023-04-25 | Disposition: A | Discharge status: Home / Self Care | Payer: Medicare HMO | Source: Ambulatory Visit | Attending: Family Medicine | Admitting: Family Medicine

## 2023-04-25 ENCOUNTER — Ambulatory Visit (INDEPENDENT_AMBULATORY_CARE_PROVIDER_SITE_OTHER): Payer: Medicare HMO | Admitting: Family Medicine

## 2023-04-25 VITALS — BP 133/70 | HR 75 | Ht 62.0 in | Wt 171.1 lb

## 2023-04-25 DIAGNOSIS — M25551 Pain in right hip: Secondary | ICD-10-CM

## 2023-04-25 NOTE — Progress Notes (Signed)
    SUBJECTIVE:   CHIEF COMPLAINT / HPI:   Right hip and knee pain Has had pain for the last 3 years, though both sides have had increased pain in the last couple days.  No recent increase in activity or trauma that she knows of.  No redness or increased swelling of the joints.  Mostly painful when walking and bending over.  No fevers or other sick symptoms.  Has never had x-rays done for this.  Has never been told she has arthritis.  PERTINENT  PMH / PSH: Coronary artery disease, osteoporosis  OBJECTIVE:   BP 133/70   Pulse 75   Ht 5\' 2"  (1.575 m)   Wt 171 lb 2 oz (77.6 kg)   SpO2 99%   BMI 31.30 kg/m   General: Alert and oriented, in NAD Skin: Warm, dry, and intact without lesions HEENT: NCAT, EOM grossly normal, midline nasal septum Cardiac: RRR, no m/r/g appreciated Respiratory: CTAB, breathing and speaking comfortably on RA Neurological/MSK: No gross focal deficit, ambulating well with me, tenderness of the right knee elicited with extension but not with flexion, valgus stress, varus stress; tenderness of right hip elicited with internal rotation but with otherwise normal range of motion; no overlying erythema or pain to palpation of either joint; right knee ballottement test positive Psychiatric: Appropriate mood and affect   ASSESSMENT/PLAN:   Right hip and knee pain History and exam most concerning for osteoarthritis.  Less likely fracture or septic joint.  Reassured overall by exam and normal gait.  Given acute on chronic nature of symptoms, will obtain x-rays of right hip and right knee.  Recommended Voltaren gel to both joints twice a day to help with pain in the meantime along with Tylenol.  Also provided lower extremity exercises to do at home with appropriate caution.  Discussed potential possibility for formal physical therapy and joint injections in the future.   Jaime Holmes, MD [facility]

## 2023-04-25 NOTE — Patient Instructions (Addendum)
You likely have arthritis in your hip and knee. I will order x-rays. You can walk in to get these done at any time. In the meantime, use voltaren (diclofenac is the generic) gel twice a day on the joints. You can also use tylenol. Do not use ibuprofen or other NSAIDs with the voltaren. You can also use the provided exercises. Be careful when performing these and do not proceed if you feel unsteady. You may need formal physical therapy or joint injections with steroids in the future.

## 2023-04-26 DIAGNOSIS — M25551 Pain in right hip: Secondary | ICD-10-CM | POA: Insufficient documentation

## 2023-04-26 NOTE — Assessment & Plan Note (Signed)
History and exam most concerning for osteoarthritis.  Less likely fracture or septic joint.  Reassured overall by exam and normal gait.  Given acute on chronic nature of symptoms, will obtain x-rays of right hip and right knee.  Recommended Voltaren gel to both joints twice a day to help with pain in the meantime along with Tylenol.  Also provided lower extremity exercises to do at home with appropriate caution.  Discussed potential possibility for formal physical therapy and joint injections in the future.

## 2023-05-04 ENCOUNTER — Encounter: Payer: Self-pay | Admitting: Family Medicine

## 2023-05-30 ENCOUNTER — Encounter: Payer: Self-pay | Admitting: Internal Medicine

## 2023-05-30 ENCOUNTER — Encounter: Payer: Self-pay | Admitting: Family Medicine

## 2023-05-30 DIAGNOSIS — K219 Gastro-esophageal reflux disease without esophagitis: Secondary | ICD-10-CM

## 2023-05-30 MED ORDER — OMEPRAZOLE 20 MG PO CPDR: 20.0000 mg | DELAYED_RELEASE_CAPSULE | Freq: Every day | ORAL | 3 refills | Status: DC

## 2023-05-30 MED ORDER — ISOSORBIDE MONONITRATE ER 30 MG PO TB24: 30.0000 mg | ORAL_TABLET | Freq: Every day | ORAL | 0 refills | Status: DC

## 2023-06-01 ENCOUNTER — Other Ambulatory Visit: Payer: Self-pay

## 2023-06-01 MED ORDER — ISOSORBIDE MONONITRATE ER 30 MG PO TB24: 30.0000 mg | ORAL_TABLET | Freq: Every day | ORAL | 1 refills | Status: DC

## 2023-06-01 MED ORDER — NITROGLYCERIN 0.4 MG SL SUBL: 0.4000 mg | SUBLINGUAL_TABLET | SUBLINGUAL | 3 refills | Status: DC | PRN

## 2023-06-01 NOTE — Progress Notes (Signed)
Prescription sent to pharmacy.

## 2023-08-14 ENCOUNTER — Other Ambulatory Visit: Payer: Self-pay | Admitting: Internal Medicine

## 2023-08-14 DIAGNOSIS — E785 Hyperlipidemia, unspecified: Secondary | ICD-10-CM

## 2023-09-07 ENCOUNTER — Ambulatory Visit: Payer: Medicare HMO | Admitting: Internal Medicine

## 2023-09-07 ENCOUNTER — Encounter: Payer: Self-pay | Admitting: Internal Medicine

## 2023-09-07 DIAGNOSIS — E785 Hyperlipidemia, unspecified: Secondary | ICD-10-CM

## 2023-09-07 MED ORDER — ISOSORBIDE MONONITRATE ER 30 MG PO TB24: 30.0000 mg | ORAL_TABLET | Freq: Every day | ORAL | 0 refills | Status: DC

## 2023-09-07 MED ORDER — ROSUVASTATIN CALCIUM 40 MG PO TABS: 40.0000 mg | ORAL_TABLET | Freq: Every day | ORAL | 0 refills | Status: DC

## 2023-09-07 MED ORDER — FUROSEMIDE 20 MG PO TABS: 20.0000 mg | ORAL_TABLET | Freq: Every day | ORAL | 0 refills | Status: DC

## 2023-10-04 ENCOUNTER — Other Ambulatory Visit: Payer: Self-pay

## 2023-10-04 ENCOUNTER — Encounter: Payer: Self-pay | Admitting: Internal Medicine

## 2023-10-04 MED ORDER — FUROSEMIDE 20 MG PO TABS: 20.0000 mg | ORAL_TABLET | Freq: Every day | ORAL | 0 refills | Status: DC

## 2023-10-04 MED ORDER — METOPROLOL TARTRATE 50 MG PO TABS: 50.0000 mg | ORAL_TABLET | Freq: Two times a day (BID) | ORAL | 0 refills | Status: DC

## 2023-10-26 ENCOUNTER — Other Ambulatory Visit: Payer: Self-pay | Admitting: Family Medicine

## 2023-10-26 ENCOUNTER — Encounter: Payer: Self-pay | Admitting: Family Medicine

## 2023-10-26 ENCOUNTER — Ambulatory Visit
Admission: RE | Admit: 2023-10-26 | Discharge: 2023-10-26 | Disposition: A | Discharge status: Home / Self Care | Payer: Medicare HMO | Source: Ambulatory Visit | Attending: Family Medicine | Admitting: Family Medicine

## 2023-10-26 DIAGNOSIS — Z780 Asymptomatic menopausal state: Secondary | ICD-10-CM

## 2023-10-26 DIAGNOSIS — K219 Gastro-esophageal reflux disease without esophagitis: Secondary | ICD-10-CM

## 2023-10-26 NOTE — Progress Notes (Signed)
 T-score -3.0, slightly progressed from last DEXA. Due for another in 2027. Pt has previously been hesitant to take meds other than calcium and vitamin D for osteoporosis. Also on omeprazole which is not ideal. Advised pt to make appt to discuss this further.

## 2023-11-14 NOTE — Progress Notes (Unsigned)
 Cardiology Office Note    Date:  11/15/2023  ID:  Menna, Abeln [birth year], MRN [id number] PCP:  Para March, DO  Cardiologist:  Parke Poisson, MD  Electrophysiologist:  None   Chief Complaint: Follow up for CAD   History of Present Illness: .    Jaime Chavez is a 83 y.o. female with visit-pertinent history of CAD s/p CABG x 4 with LIMA to LAD, sequential SVG to distal RCA and PDA and SVG to OM on June 14, 2018.  Patient has history of GERD and hyperlipidemia.    Patient has history of dyspnea on exertion since she was a teenager, has previously reported no difference pre or post coronary revascularization.  She was previously evaluated by pulmonary at recommendation of Dr. Doreene Adas, pulmonary evaluation suggested restriction on PFTs and shortness of breath likely secondary to deconditioning and body habitus.  Patient was last seen in office on 09/07/2022 by Dr. Doreene Adas.  Patient reported that she had been doing well overall.  She did note that she became fatigued times and her shortness of breath was stable.  She was continued on furosemide 20 mg and felt that it had helped with her symptoms.  She reported that her energy level had improved and shortness of breath was improved.  Echocardiogram on 10/04/2022 indicated LVEF of 60 to 65%, no RWMA, grade 1 diastolic dysfunction, RV systolic function and size was normal, there were no significant valvular abnormalities.  Today she presents for follow-up.  She reports that she has been doing well.  She has no cardiac concerns or complaints today.  Patient notes that she was having a few muscle aches in her legs in the evenings, notes that this has recently improved with discontinuation of omeprazole.  Patient denies any legs aching while exercising.  She also notes occasional headaches, questions of possibly related to omeprazole or rosuvastatin.  Patient notes that she has an occasional chest discomfort when climbing up her driveway,  quickly resolves with rest, she has not required sublingual nitroglycerin in recent months.  She has been trying to walk more regularly and has been tolerating this well without recurrent chest pain.  She and her husband recently returned from [place] and are planning to soon travel to [place].  ROS: .   Today she denies lower extremity edema, fatigue, palpitations, melena, hematuria, hemoptysis, diaphoresis, weakness, presyncope, syncope, orthopnea, and PND.  All other systems are reviewed and otherwise negative. Studies Reviewed: Marland Kitchen    EKG:  EKG is ordered today, personally reviewed, demonstrating  EKG Interpretation Date/Time:  Thursday November 15 2023 13:33:09 EDT Ventricular Rate:  67 PR Interval:  202 QRS Duration:  74 QT Interval:  412 QTC Calculation: 435 R Axis:   34  Text Interpretation: Normal sinus rhythm Nonspecific ST&T wave abnormalitiy No significant change since last tracing Confirmed by Reather Littler [phone]) on 11/15/2023 6:23:49 PM   CV Studies: Cardiac studies reviewed are outlined and summarized above. Otherwise please see EMR for full report. Cardiac Studies & Procedures   ______________________________________________________________________________________________ CARDIAC CATHETERIZATION  CARDIAC CATHETERIZATION 06/13/2018  Narrative Conclusions: 1. Severe three-vessel coronary artery disease, as outlined below. 2. Hyperdynamic left ventricular contraction. 3. Low normal left and right heart filling pressures. 4. Normal Fick cardiac output/index.  Recommendations: 1. Given chest pain with minimal activity and intermittently at rest, I will admit Ms. Hendricks Limes for expedited cardiac surgery consultation for CABG. 2. Begin carvedilol and isosorbide mononitrate for antianginal therapy. 3. Continue indefinite aspirin 81 mg daily. 4. Obtain  transthoracic echocardiogram. 5. Aggressive secondary prevention, including high-intensity statin therapy.  [name], MD [facility]  HeartCare Pager: [phone]  Findings Coronary Findings Diagnostic  Dominance: Right  Left Main Vessel is moderate in size.  Left Anterior Descending Vessel is moderate in size. Prox LAD to Mid LAD lesion is 70% stenosed. Mid LAD lesion is 90% stenosed. Dist LAD lesion is 50% stenosed.  First Diagonal Branch Vessel is small in size.  Second Diagonal Branch Vessel is small in size.  Third Diagonal Branch Vessel is small in size.  Left Circumflex Vessel is moderate in size. Prox Cx lesion is 80% stenosed.  First Obtuse Marginal Branch Vessel is small in size.  Second Obtuse Marginal Branch Vessel is moderate in size.  Right Coronary Artery Vessel is moderate in size. Prox RCA lesion is 80% stenosed. Mid RCA-1 lesion is 90% stenosed. Mid RCA-2 lesion is 60% stenosed. Dist RCA lesion is 40% stenosed.  Right Posterior Descending Artery Vessel is moderate in size. Ost RPDA to RPDA lesion is 70% stenosed. RPDA lesion is 90% stenosed.  Right Posterior Atrioventricular Artery Vessel is moderate in size.  Intervention  No interventions have been documented.     ECHOCARDIOGRAM  ECHOCARDIOGRAM COMPLETE 10/04/2022  Narrative ECHOCARDIOGRAM REPORT    Patient Name:   Jaime Chavez   Date of Exam: 10/04/2022 Medical Rec #:  [id number]     Height:       62.5 in Accession #:    [id number]    Weight:       173.4 lb Date of Birth:  November 13, 1940    BSA:          1.810 m Patient Age:    81 years      BP:           122/72 mmHg Patient Gender: F             HR:           66 bpm. Exam Location:  [address]  Procedure: 2D Echo, Cardiac Doppler, Color Doppler and Intracardiac Opacification Agent  Indications:    I51.89 Diastolic Dysfunction  History:        Patient has prior history of Echocardiogram examinations, most recent 03/02/2020. CAD, Prior CABG, Signs/Symptoms:Dyspnea; Risk Factors:Dyslipidemia.  Sonographer:    Daphine Deutscher RDCS Referring  Phys: [id number] Parke Poisson   Sonographer Comments: Technically difficult study due to poor echo windows. IMPRESSIONS   1. Left ventricular ejection fraction, by estimation, is 60 to 65%. The left ventricle has normal function. The left ventricle has no regional wall motion abnormalities. Left ventricular diastolic parameters are consistent with Grade I diastolic dysfunction (impaired relaxation). 2. Probable prominent RV fat pad. Right ventricular systolic function is normal. The right ventricular size is normal. 3. The mitral valve is normal in structure. No evidence of mitral valve regurgitation. No evidence of mitral stenosis. 4. The aortic valve is tricuspid. There is mild calcification of the aortic valve. Aortic valve regurgitation is not visualized. No aortic stenosis is present. 5. The inferior vena cava is normal in size with greater than 50% respiratory variability, suggesting right atrial pressure of 3 mmHg.  FINDINGS Left Ventricle: Left ventricular ejection fraction, by estimation, is 60 to 65%. The left ventricle has normal function. The left ventricle has no regional wall motion abnormalities. Definity contrast agent was given IV to delineate the left ventricular endocardial borders. The left ventricular internal cavity size was normal in size. There is no left ventricular hypertrophy. Left  ventricular diastolic parameters are consistent with Grade I diastolic dysfunction (impaired relaxation).  Right Ventricle: Probable prominent RV fat pad. The right ventricular size is normal. No increase in right ventricular wall thickness. Right ventricular systolic function is normal.  Left Atrium: Left atrial size was normal in size.  Right Atrium: Right atrial size was normal in size.  Pericardium: There is no evidence of pericardial effusion.  Mitral Valve: The mitral valve is normal in structure. Mild mitral annular calcification. No evidence of mitral valve regurgitation. No  evidence of mitral valve stenosis.  Tricuspid Valve: The tricuspid valve is normal in structure. Tricuspid valve regurgitation is not demonstrated. No evidence of tricuspid stenosis.  Aortic Valve: The aortic valve is tricuspid. There is mild calcification of the aortic valve. Aortic valve regurgitation is not visualized. No aortic stenosis is present.  Pulmonic Valve: The pulmonic valve was normal in structure. Pulmonic valve regurgitation is not visualized. No evidence of pulmonic stenosis.  Aorta: The aortic root is normal in size and structure.  Venous: The inferior vena cava is normal in size with greater than 50% respiratory variability, suggesting right atrial pressure of 3 mmHg.  IAS/Shunts: No atrial level shunt detected by color flow Doppler.   LEFT VENTRICLE PLAX 2D LVIDd:         3.30 cm   Diastology LVIDs:         2.30 cm   LV e' medial:    5.38 cm/s LV PW:         1.00 cm   LV E/e' medial:  10.1 LV IVS:        1.00 cm   LV e' lateral:   6.20 cm/s LVOT diam:     1.60 cm   LV E/e' lateral: 8.8 LV SV:         36 LV SV Index:   20 LVOT Area:     2.01 cm   RIGHT VENTRICLE RV Basal diam:  3.00 cm RV S prime:     8.10 cm/s  LEFT ATRIUM             Index        RIGHT ATRIUM          Index LA diam:        3.20 cm 1.77 cm/m   RA Area:     8.88 cm LA Vol (A2C):   25.6 ml 14.14 ml/m  RA Volume:   18.00 ml 9.95 ml/m LA Vol (A4C):   22.2 ml 12.27 ml/m LA Biplane Vol: 24.8 ml 13.70 ml/m AORTIC VALVE LVOT Vmax:   82.00 cm/s LVOT Vmean:  55.000 cm/s LVOT VTI:    0.178 m  AORTA Ao Root diam: 3.00 cm Ao Asc diam:  3.20 cm  MITRAL VALVE MV Area (PHT): 3.53 cm    SHUNTS MV Decel Time: 215 msec    Systemic VTI:  0.18 m MV E velocity: 54.50 cm/s  Systemic Diam: 1.60 cm MV A velocity: 79.40 cm/s MV E/A ratio:  0.69  Arvilla Meres MD Electronically signed by Arvilla Meres MD Signature Date/Time: 10/04/2022/11:46:38 AM    Final   TEE  ECHO TEE  06/14/2018  Interpretation Summary  Left Atrium: Normal size, no evidence of LAA thrombus with PWD velocities > 40 mm/s in LAA  Atrial Septum: no evidence of PFO or ASD on color flow doppler  Right Atrium: Normal size, PA catheter noted traversing RA  Mitral Valve: No leaflet thickening and calcification present. Trace regurgitation.  Aortic  Valve: The valve is trileaflet. No stenosis. No regurgitation.  Right Ventricle: Normal cavity size, wall thickness and ejection fraction.  Left Ventricle: Normal chamber size and systolic function, LVEF 55%, no significant WMAs noted. Normal LV wall thickness.  Tricuspid Valve: Trace regurgitation with central jet.  Pulmonic Valve: Trace regurgitation by color doppler.  Pericardium: Trivial pericardial effusion.  Aorta: Grade 2 calcification of the descending aorta and aortic arch, no evidence of dissection  Post Bypass TEE:  Tricuspid, Pulmonic, Mitral and Aortic valve unchanged. LVEF > 50% (CO > 3), no significant wall motion abnormalities. No evidence of dissection after cannula removed.  Kaylyn Layer Hart [place], MD, [facility] Anesthesiology        ______________________________________________________________________________________________       Current Reported Medications:.    Current Meds  Medication Sig   acetaminophen (TYLENOL) 325 MG tablet Take 650 mg by mouth every 6 (six) hours as needed.   aspirin EC 81 MG tablet Take 1 tablet (81 mg total) by mouth daily.   calcium carbonate (OS-CAL) 600 MG TABS tablet Take 600 mg by mouth 2 (two) times daily with a meal.   cetirizine (ZYRTEC) 10 MG tablet Take 10 mg by mouth as needed for allergies.   fluticasone (FLONASE) 50 MCG/ACT nasal spray Place into both nostrils as needed.    furosemide (LASIX) 20 MG tablet Take 1 tablet (20 mg total) by mouth daily. Please keep upcoming appt in April 2025 with Dr. Jacques Navy.   Glucosamine-Chondroit-Vit C-Mn (GLUCOSAMINE CHONDR 1500 COMPLX) CAPS Take 1  capsule by mouth 2 (two) times daily.   ipratropium (ATROVENT) 0.03 % nasal spray Place 2 sprays into both nostrils 4 (four) times daily as needed for rhinitis.   Multiple Vitamins-Minerals (CENTRUM SILVER 50+WOMEN) TABS Take 1 tablet by mouth daily.   [DISCONTINUED] isosorbide mononitrate (IMDUR) 30 MG 24 hr tablet Take 1 tablet (30 mg total) by mouth daily.   [DISCONTINUED] metoprolol tartrate (LOPRESSOR) 50 MG tablet Take 1 tablet (50 mg total) by mouth 2 (two) times daily. Please keep upcoming appt in April 2025 with Dr. Jacques Navy   [DISCONTINUED] rosuvastatin (CRESTOR) 40 MG tablet Take 1 tablet (40 mg total) by mouth daily.   Current Facility-Administered Medications for the 11/15/23 encounter (Office Visit) with Reather Littler D, NP  Medication   ipratropium (ATROVENT) 0.03 % nasal spray 2 spray    Physical Exam:    VS:  BP 120/70 (BP Location: Right Arm, Patient Position: Sitting, Cuff Size: Normal)   Pulse 67   Ht 5' 2.5" (1.588 m)   Wt 167 lb (75.8 kg)   SpO2 95%   BMI 30.06 kg/m    Wt Readings from Last 3 Encounters:  11/15/23 167 lb (75.8 kg)  04/25/23 171 lb 2 oz (77.6 kg)  09/07/22 173 lb 6.4 oz (78.7 kg)    GEN: Well nourished, well developed in no acute distress NECK: No JVD; No carotid bruits CARDIAC: RRR, no murmurs, rubs, gallops RESPIRATORY:  Clear to auscultation without rales, wheezing or rhonchi  ABDOMEN: Soft, non-tender, non-distended EXTREMITIES:  No edema; No acute deformity     Asessement and Plan:.    CAD: s/p CABG x4 in 05/2018 with LIMA to LAD, sequential SVG to distal RCA and PDA and SVG to OM. Echocardiogram on 10/04/2022 indicated LVEF of 60 to 65%, no RWMA, grade 1 diastolic dysfunction, RV systolic function and size was normal, there were no significant valvular abnormalities.  Today patient reports that she has been doing well.  She notes some  occasional chest discomfort while climbing her driveway that is at an incline, notes quickly resolves with  rest.  She has not required any sublingual nitroglycerin in recent months.  She feels that her shortness of breath is at baseline. Heart healthy diet and regular cardiovascular exercise encouraged.  Continue aspirin 81 mg daily, Lasix 20 mg daily, Imdur 30 mg daily, metoprolol tartrate 50 mg twice daily, Crestor 40 mg daily and sublingual nitroglycerin as needed.  DOE: Patient with history of dyspnea on exertion.  Previous pulmonary evaluation completed and suggestive of deconditioning and shortness of breath secondary to body habitus.  Recommended moderate exercise program. Today she reports that her dyspnea is stable.  She has been regularly trying to increase her walking and is been tolerating well.  Hyperlipidemia: Last lipid profile indicated.  Total cholesterol 155, HDL 50, triglycerides 228 and LDL 68.  Patient's AST and ALT were mildly elevated.  Reviewed with patient will check fasting lipid profile and LFTs once she returns from [place].  Patient has noted that she has occasionally had some cramping in her legs at night, questions if related to rosuvastatin or omeprazole, notes that she has had some improvement this last week with discontinuation of omeprazole.  Patient will continue to monitor if discomfort returns can consider a statin vacation.  Continue Crestor 40 mg daily.  Hypertension: Blood pressure today 120/70.  Continue current antihypertensive regimen.   Disposition: F/u with Dr. Jacques Navy in one year or sooner if needed.   Signed, Rip Harbour, NP

## 2023-11-15 ENCOUNTER — Encounter: Payer: Self-pay | Admitting: Cardiology

## 2023-11-15 ENCOUNTER — Ambulatory Visit: Attending: Cardiology | Admitting: Cardiology

## 2023-11-15 VITALS — BP 120/70 | HR 67 | Ht 62.5 in | Wt 167.0 lb

## 2023-11-15 DIAGNOSIS — E785 Hyperlipidemia, unspecified: Secondary | ICD-10-CM | POA: Diagnosis not present

## 2023-11-15 DIAGNOSIS — Z951 Presence of aortocoronary bypass graft: Secondary | ICD-10-CM | POA: Diagnosis not present

## 2023-11-15 DIAGNOSIS — I2510 Atherosclerotic heart disease of native coronary artery without angina pectoris: Secondary | ICD-10-CM | POA: Diagnosis not present

## 2023-11-15 DIAGNOSIS — R0609 Other forms of dyspnea: Secondary | ICD-10-CM

## 2023-11-15 DIAGNOSIS — I10 Essential (primary) hypertension: Secondary | ICD-10-CM

## 2023-11-15 MED ORDER — ISOSORBIDE MONONITRATE ER 30 MG PO TB24: 30.0000 mg | ORAL_TABLET | Freq: Every day | ORAL | 3 refills | Status: AC

## 2023-11-15 MED ORDER — ROSUVASTATIN CALCIUM 40 MG PO TABS: 40.0000 mg | ORAL_TABLET | Freq: Every day | ORAL | 3 refills | Status: DC

## 2023-11-15 MED ORDER — NITROGLYCERIN 0.4 MG SL SUBL: 0.4000 mg | SUBLINGUAL_TABLET | SUBLINGUAL | 3 refills | Status: DC | PRN

## 2023-11-15 MED ORDER — METOPROLOL TARTRATE 50 MG PO TABS: 50.0000 mg | ORAL_TABLET | Freq: Two times a day (BID) | ORAL | 3 refills | Status: AC

## 2023-11-15 NOTE — Patient Instructions (Signed)
 Medication Instructions:  No changes *If you need a refill on your cardiac medications before your next appointment, please call your pharmacy*  Lab Work: In the next 2 month we are going to need a fasting lipid panel and lft's  If you have labs (blood work) drawn today and your tests are completely normal, you will receive your results only by: MyChart Message (if you have MyChart) OR A paper copy in the mail If you have any lab test that is abnormal or we need to change your treatment, we will call you to review the results.  Testing/Procedures: No testing  Follow-Up: At [facility], you and your health needs are our priority.  As part of our continuing mission to provide you with exceptional heart care, we have created designated Provider Care Teams.  These Care Teams include your primary Cardiologist (physician) and Advanced Practice Providers (APPs -  Physician Assistants and Nurse Practitioners) who all work together to provide you with the care you need, when you need it.  We recommend signing up for the patient portal called "MyChart".  Sign up information is provided on this After Visit Summary.  MyChart is used to connect with patients for Virtual Visits (Telemedicine).  Patients are able to view lab/test results, encounter notes, upcoming appointments, etc.  Non-urgent messages can be sent to your provider as well.   To learn more about what you can do with MyChart, go to [email or web address].    Your next appointment:   1 year(s)  Provider:   Parke Poisson, MD    Other Instructions

## 2023-12-07 ENCOUNTER — Ambulatory Visit: Admitting: Cardiology

## 2023-12-07 ENCOUNTER — Ambulatory Visit: Payer: Medicare HMO | Admitting: Internal Medicine

## 2023-12-13 LAB — HEPATIC FUNCTION PANEL
ALT: 92 IU/L — ABNORMAL HIGH (ref 0–32)
AST: 68 IU/L — ABNORMAL HIGH (ref 0–40)
Albumin: 4.5 g/dL (ref 3.8–4.8)
Alkaline Phosphatase: 170 IU/L — ABNORMAL HIGH (ref 44–121)
Bilirubin Total: 0.4 mg/dL (ref 0.0–1.2)
Bilirubin, Direct: 0.2 mg/dL (ref 0.00–0.40)
Total Protein: 7.1 g/dL (ref 6.0–8.5)

## 2023-12-13 LAB — LIPID PANEL
Chol/HDL Ratio: 3 ratio (ref 0.0–4.4)
Cholesterol, Total: 134 mg/dL (ref 100–199)
HDL: 45 mg/dL (ref >39)
LDL Chol Calc (NIH): 55 mg/dL (ref 0–99)
Triglycerides: 212 mg/dL — ABNORMAL HIGH (ref 0–149)
VLDL Cholesterol Cal: 34 mg/dL (ref 5–40)

## 2023-12-17 ENCOUNTER — Telehealth: Payer: Self-pay

## 2023-12-17 DIAGNOSIS — R0609 Other forms of dyspnea: Secondary | ICD-10-CM

## 2023-12-17 DIAGNOSIS — I2510 Atherosclerotic heart disease of native coronary artery without angina pectoris: Secondary | ICD-10-CM

## 2023-12-17 DIAGNOSIS — E785 Hyperlipidemia, unspecified: Secondary | ICD-10-CM

## 2023-12-17 NOTE — Telephone Encounter (Signed)
-----   Message from Jaime Chavez sent at 12/17/2023  1:39 PM EDT ----- Please let Jaime Chavez know that her cholesterol panel shows that her LDL is well controlled, her triglycerides are above goal. Her liver enzymes are continuing to uptrend. Recommend she hold her rosuvastatin  for the next three weeks then have repeat LFTs to monitor her liver enzymes. At her last OV she did report increased muscle cramps that had been improving, have these recurrred?

## 2023-12-17 NOTE — Telephone Encounter (Signed)
 Called patient advised of below they verbalized understanding No questions order labs

## 2023-12-18 ENCOUNTER — Encounter: Payer: Self-pay | Admitting: Internal Medicine

## 2023-12-18 ENCOUNTER — Other Ambulatory Visit: Payer: Self-pay

## 2023-12-18 MED ORDER — FUROSEMIDE 20 MG PO TABS: 20.0000 mg | ORAL_TABLET | Freq: Every day | ORAL | 3 refills | Status: AC

## 2023-12-18 NOTE — Telephone Encounter (Signed)
RX sent to [facility]

## 2024-01-16 LAB — HEPATIC FUNCTION PANEL
ALT: 68 IU/L — ABNORMAL HIGH (ref 0–32)
AST: 51 IU/L — ABNORMAL HIGH (ref 0–40)
Albumin: 4.4 g/dL (ref 3.8–4.8)
Alkaline Phosphatase: 129 IU/L — ABNORMAL HIGH (ref 44–121)
Bilirubin Total: 0.5 mg/dL (ref 0.0–1.2)
Bilirubin, Direct: 0.15 mg/dL (ref 0.00–0.40)
Total Protein: 6.8 g/dL (ref 6.0–8.5)

## 2024-01-17 ENCOUNTER — Ambulatory Visit: Payer: Self-pay | Admitting: Cardiology

## 2024-01-17 DIAGNOSIS — E785 Hyperlipidemia, unspecified: Secondary | ICD-10-CM

## 2024-01-23 NOTE — Telephone Encounter (Signed)
 Sent Referral to pharm-D advised patient of results they were agreeable

## 2024-01-23 NOTE — Telephone Encounter (Signed)
-----   Message from Katlyn D West sent at 01/17/2024  1:34 PM EDT ----- Please let Jaime Chavez know that her liver enzymes had some improvement coming off of her statin. They do remain elevated, recommend continuing to hold her statin and following up with her PCP regarding elevated LFTs. For her cholesterol recommend referral to lipid clinic to discuss possibly starting PCSK9 inhibitor.

## 2024-01-24 ENCOUNTER — Telehealth: Payer: Self-pay | Admitting: *Deleted

## 2024-01-24 NOTE — Telephone Encounter (Signed)
 Received letter from [facility] regarding Class II Recall issued by [employer].  Called [facility] to ask if SL NTG was dispensed to pt. Pharmacy rep states that they use [facility] and not [facility]. Letter shredded.

## 2024-03-03 ENCOUNTER — Encounter: Payer: Self-pay | Admitting: Internal Medicine

## 2024-03-03 NOTE — Telephone Encounter (Signed)
 RX sent in to [facility] on 12/18/23. Confirmed with the Pharmacy and then called Pt to advise. Pt verbalized understanding.

## 2024-03-12 ENCOUNTER — Ambulatory Visit: Attending: Cardiovascular Disease | Admitting: Pharmacist

## 2024-03-12 DIAGNOSIS — E782 Mixed hyperlipidemia: Secondary | ICD-10-CM

## 2024-03-12 DIAGNOSIS — R7989 Other specified abnormal findings of blood chemistry: Secondary | ICD-10-CM | POA: Diagnosis not present

## 2024-03-12 NOTE — Patient Instructions (Addendum)
 [phone]- primary care hotline Dr. Watt- [facility] or [place] [facility] [place], [facility]  Hyperlipidemia Foods high in saturated fat tend to increase LDL (bad) cholesterol the most.  Not all fat is bad fat! Foods higher in unsaturated fat are healthy, like fish, nuts, and avocadoes. Overall, following a diet like the Mediterranean diet can help to improve your cholesterol. Hypertriglyceridemia Foods high in carbohydrates and sugar, as well as alcohol, can increase your triglycerides. If you are diabetic, poorly controlled blood sugar can also increase your triglycerides. A non-fasting state can affect the triglyceride level in your lab work. Please make sure you are fasting to improve accuracy of this lab test.  Eat more of these Eat less of these  Carbohydrates Fiber-rich whole grains: oats, whole wheat pasta or bread, quinoa, barley, oats and brown rice Aim for  of your plate to be whole grains Men: aim for > 38 grams of fiber per day Women: aim for > 25 grams of fiber per day Refined grains: white bread, rice, or pasta, macaroni and cheese Foods with added sugar Processed foods: desserts like cake, cookies, donuts, muffins, and pastries; microwave meals, chips, [place] fries  Fruits and vegetables A variety of bright colored fruits and vegetables: spinach, broccoli, tomatoes, carrots, berries,  oranges, apples, bananas, berries, and melon Aim for  of your plate to be fruits/vegetables Canned vegetables Starchy vegetables like potatoes Canned fruit in heavy syrup  Protein Lean meat: skinless chicken or [place] Fish: salmon, trout, tuna, cod, tilapia, flounder, etc Legumes: beans, lentils, chickpeas, tofu, nuts Aim for  of your plate to be protein Red, fatty, or fried meat Processed foods: deli meat, hot dogs, burgers, pizza, fast food   Dairy, fats and oils Unsaturated fats: fish, nuts, and avocadoes  Low fat or fat free milk or  yogurt Olive and canola oil Saturated fats: butter, lard, cream, coconut oil Whole milk and other full fat dairy products like cheese Sugar-sweetened dairy products (many yogurts have added sugar)  Drinks Water: plain or sparkling Sugar free or diet drinks Unsweet tea or coffee Keep added sugar intake to  < 6 teaspoons (24 grams) Regular soda Fruit juice Alcohol  Other ways to adopt a healthy lifestyle:  Exercise:  Exercise: Aim for 150 min of moderate intensity exercise weekly for heart health, and weights twice weekly for bone health. Stay active - any steps are better than no steps!  Sleep: Aim for 7-9 hours of sleep nightly.  Weight: Know what a healthy weight is for you (roughly BMI <25) and aim to maintain this. Unfortunately, this is not the most accurate measure of healthy weight, but it is the simplest measurement to use. A more accurate measurement involves body scanning which measures lean muscle, fat tissue and bony density. We do not have this equipment at [facility].

## 2024-03-12 NOTE — Assessment & Plan Note (Signed)
 Assessment: Rosuvastatin  40 mg daily stopped due to LFTs 3 times left upper limit of normal LFTs have trended down since stopping Patient very hesitant to resume any cholesterol treatment She wants to work on her diet.  We discussed that genetics plays a large role in our response to diet.  Her baseline LDL-C around 200 indicates to me that there may be a genetic component of her elevated cholesterol and therefore achieving an LDL-C less than 70 without medication is highly unlikely to happen.  Encouraged more organized exercise such as walking and some resistance exercises Reviewed options for lowering LDL cholesterol, including ezetimibe, PCSK-9 inhibitors, bempedoic acid.  Discussed mechanisms of action, dosing, side effects and potential decreases in LDL cholesterol. Patient very hesitant about starting a new medication, concerned that she will have that rare side effect.  Plan: Recheck LFTs today to make sure they have normalized or trended down further Patient declined medication therapy today asking to work on diet Recheck labs in 3 months and rediscuss medication at that time

## 2024-03-12 NOTE — Progress Notes (Signed)
 Patient ID: Jaime Chavez                 DOB: 05-23-1941                    MRN: [id number]      HPI: Jaime Chavez is a 83 y.o. female patient referred to lipid clinic by Katlyn West, PA. PMH is significant for CAD s/p CABG x 4 with LIMA to LAD, sequential SVG to distal RCA and PDA and SVG to OM on June 14, 2018, GERD, HLD. Patient's rosuvastatin  recently stopped in April due to elevation in LFT. ALT up to 92. Improved to 68 upon stopping rosuvastatin  40mg .   Patient presents today accompanied by her husband.  She initially seems confused about the length of time she had been on rosuvastatin .  Digging through the chart it appears as though she has been on rosuvastatin  since 2019.  Initially at 20 mg daily.  This was increased in February 2020 24 to 40 mg daily.  Historically her triglycerides have always been elevated but LDL-C was well-controlled.  Prior to treatment her LDL cholesterol was 208.  Has not seen her primary care doctor in about a year and a half.  Reluctant to take any more medication.  Reports wanting to work on her diet as she does eat a decent amount of high-fat cheeses, dairy products, butter and ghee.  ALT 37>50>92>68 AST 26>48, 68, 51  Reviewed options for lowering LDL cholesterol, including ezetimibe, PCSK-9 inhibitors, bempedoic acid.  Discussed mechanisms of action, dosing, side effects and potential decreases in LDL cholesterol.   Current Medications: None Intolerances: Rosuvastatin  40 mg (increasing LFTs) Risk Factors: CAD status post CABG, age LDL-C goal: Less than 70 ApoB goal: Less than 80  Diet:  Breakfast: varies a lot- yogurt fruit/granola, scrambled eggs w/ holindase, fried potoes bacon 3 times a week Admits needs more vegetables Trying to eat less breads Limited fried foods Drink: water Protein: chicken, some fish, shrimp Very little red meat  Exercise: walks about 2 miles per day mostly from daily activity or shopping  Family History:  Family  History  Problem Relation Age of Onset   Pancreatic cancer Mother    Heart attack Father        age 75, had 3 heart attacks from blood clots    Other Father        clotting disorder   Colon cancer Sister    Breast cancer Paternal Aunt     Social History: wine or cocktail 4 times per week  Labs: Lipid Panel     Component Value Date/Time   CHOL 134 12/13/2023 0922   TRIG 212 (H) 12/13/2023 0922   HDL 45 12/13/2023 0922   CHOLHDL 3.0 12/13/2023 0922   LDLCALC 55 12/13/2023 0922   LABVLDL 34 12/13/2023 9077    Past Medical History:  Diagnosis Date   Acid reflux    Coronary artery disease involving native coronary artery of native heart with angina pectoris (HCC) 06/13/2018   Dyspnea    Environmental allergies    Hyperlipidemia 2014   Left patella fracture 2005   Osteoporosis 08/02/2021   DEXA 07/2021   S/P CABG x 4 06/14/2018   LIMA to LAD, SVG to OM, Sequential SVG to RCA and PDA, EVH from bilateral thighs and RLL    Current Outpatient Medications on File Prior to Visit  Medication Sig Dispense Refill   acetaminophen  (TYLENOL ) 325 MG tablet Take 650 mg by mouth  every 6 (six) hours as needed.     aspirin  EC 81 MG tablet Take 1 tablet (81 mg total) by mouth daily. 60 tablet 2   calcium  carbonate (OS-CAL) 600 MG TABS tablet Take 600 mg by mouth 2 (two) times daily with a meal.     cetirizine (ZYRTEC) 10 MG tablet Take 10 mg by mouth as needed for allergies.     fluticasone  (FLONASE ) 50 MCG/ACT nasal spray Place into both nostrils as needed.      furosemide  (LASIX ) 20 MG tablet Take 1 tablet (20 mg total) by mouth daily. 90 tablet 3   Glucosamine-Chondroit-Vit C-Mn (GLUCOSAMINE CHONDR 1500 COMPLX) CAPS Take 1 capsule by mouth 2 (two) times daily.     isosorbide  mononitrate (IMDUR ) 30 MG 24 hr tablet Take 1 tablet (30 mg total) by mouth daily. 90 tablet 3   metoprolol  tartrate (LOPRESSOR ) 50 MG tablet Take 1 tablet (50 mg total) by mouth 2 (two) times daily. 180 tablet 3    Multiple Vitamins-Minerals (CENTRUM SILVER 50+WOMEN) TABS Take 1 tablet by mouth daily.     nitroGLYCERIN  (NITROSTAT ) 0.4 MG SL tablet Place 1 tablet (0.4 mg total) under the tongue every 5 (five) minutes as needed for chest pain. 25 tablet 3   Current Facility-Administered Medications on File Prior to Visit  Medication Dose Route Frequency Provider Last Rate Last Admin   ipratropium (ATROVENT ) 0.03 % nasal spray 2 spray  2 spray Each Nare QID PRN Shelah Lamar RAMAN, MD        Allergies  Allergen Reactions   Osteoporosis Support [A-G Pro] Other (See Comments)    Patient that she started having severe pain and back spasms.    Alendronate  Sodium Other (See Comments)    Back pain, muscle spasms    Assessment/Plan:  1. Hyperlipidemia -  Hyperlipidemia Assessment: Rosuvastatin  40 mg daily stopped due to LFTs 3 times left upper limit of normal LFTs have trended down since stopping Patient very hesitant to resume any cholesterol treatment She wants to work on her diet.  We discussed that genetics plays a large role in our response to diet.  Her baseline LDL-C around 200 indicates to me that there may be a genetic component of her elevated cholesterol and therefore achieving an LDL-C less than 70 without medication is highly unlikely to happen.  Encouraged more organized exercise such as walking and some resistance exercises Reviewed options for lowering LDL cholesterol, including ezetimibe, PCSK-9 inhibitors, bempedoic acid.  Discussed mechanisms of action, dosing, side effects and potential decreases in LDL cholesterol. Patient very hesitant about starting a new medication, concerned that she will have that rare side effect.  Plan: Recheck LFTs today to make sure they have normalized or trended down further Patient declined medication therapy today asking to work on diet Recheck labs in 3 months and rediscuss medication at that time    Thank you,  Carrie Usery D Maiya Kates, Pharm.JONETTA SARAN,  CPP [facility] [address], [place], [place] [address]  Phone: [phone]; Fax: [phone]

## 2024-03-13 LAB — HEPATIC FUNCTION PANEL
ALT: 48 IU/L — ABNORMAL HIGH (ref 0–32)
AST: 37 IU/L (ref 0–40)
Albumin: 4.7 g/dL (ref 3.8–4.8)
Alkaline Phosphatase: 116 IU/L (ref 44–121)
Bilirubin Total: 0.4 mg/dL (ref 0.0–1.2)
Bilirubin, Direct: 0.12 mg/dL (ref 0.00–0.40)
Total Protein: 7.4 g/dL (ref 6.0–8.5)

## 2024-03-14 ENCOUNTER — Ambulatory Visit: Payer: Self-pay | Admitting: Pharmacist

## 2024-03-14 DIAGNOSIS — Z951 Presence of aortocoronary bypass graft: Secondary | ICD-10-CM

## 2024-03-14 DIAGNOSIS — E782 Mixed hyperlipidemia: Secondary | ICD-10-CM

## 2024-05-12 ENCOUNTER — Ambulatory Visit (INDEPENDENT_AMBULATORY_CARE_PROVIDER_SITE_OTHER)

## 2024-05-12 VITALS — Ht 62.0 in | Wt 162.0 lb

## 2024-05-12 DIAGNOSIS — Z0000 Encounter for general adult medical examination without abnormal findings: Secondary | ICD-10-CM | POA: Diagnosis not present

## 2024-05-12 NOTE — Progress Notes (Signed)
 Because this visit was a virtual/telehealth visit,  certain criteria was not obtained, such a blood pressure, CBG if applicable, and timed get up and go. Any medications not marked as taking were not mentioned during the medication reconciliation part of the visit. Any vitals not documented were not able to be obtained due to this being a telehealth visit or patient was unable to self-report a recent blood pressure reading due to a lack of equipment at home via telehealth. Vitals that have been documented are verbally provided by the patient.   Subjective:   Jaime Chavez is a 83 y.o. who presents for a Medicare Wellness preventive visit.  As a reminder, Annual Wellness Visits don't include a physical exam, and some assessments may be limited, especially if this visit is performed virtually. We may recommend an in-person follow-up visit with your provider if needed.  Visit Complete: Virtual I connected with  Jaime Chavez on 05/12/24 by a audio enabled telemedicine application and verified that I am speaking with the correct person using two identifiers.  Patient Location: Home  Provider Location: Home Office  I discussed the limitations of evaluation and management by telemedicine. The patient expressed understanding and agreed to proceed.  Vital Signs: Because this visit was a virtual/telehealth visit, some criteria may be missing or patient reported. Any vitals not documented were not able to be obtained and vitals that have been documented are patient reported.  VideoDeclined- This patient declined Librarian, academic. Therefore the visit was completed with audio only.  Persons Participating in Visit: Patient.  AWV Questionnaire: Yes: Patient Medicare AWV questionnaire was completed by the patient on 05/08/2024; I have confirmed that all information answered by patient is correct and no changes since this date.  Cardiac Risk Factors include: advanced age  (>23men, >62 women);dyslipidemia;family history of premature cardiovascular disease     Objective:    Today's Vitals   05/12/24 1129  Weight: 162 lb (73.5 kg)  Height: 5' 2 (1.575 m)  PainSc: 0-No pain   Body mass index is 29.63 kg/m.     05/12/2024   11:30 AM 04/25/2023   10:18 AM 02/20/2023    8:34 AM 08/30/2022    8:31 AM 07/18/2022    1:37 PM 06/12/2022    4:09 PM 02/10/2021    2:05 PM  Advanced Directives  Does Patient Have a Medical Advance Directive? Yes No Yes Yes Yes No No;Yes  Type of Estate agent of Congress;Living will  Healthcare Power of [employer] Power of [employer] Power of [place];Living will  Living will  Does patient want to make changes to medical advance directive?       No - Patient declined  Copy of Healthcare Power of Attorney in Chart? No - copy requested  No - copy requested No - copy requested No - copy requested    Would patient like information on creating a medical advance directive?  No - Patient declined No - Patient declined  No - Patient declined No - Patient declined No - Patient declined    Current Medications (verified) Outpatient Encounter Medications as of 05/12/2024  Medication Sig   acetaminophen  (TYLENOL ) 325 MG tablet Take 650 mg by mouth every 6 (six) hours as needed.   aspirin  EC 81 MG tablet Take 1 tablet (81 mg total) by mouth daily.   calcium  carbonate (OS-CAL) 600 MG TABS tablet Take 600 mg by mouth 2 (two) times daily with a meal.   cetirizine (ZYRTEC)  10 MG tablet Take 10 mg by mouth as needed for allergies.   fluticasone  (FLONASE ) 50 MCG/ACT nasal spray Place into both nostrils as needed.    furosemide  (LASIX ) 20 MG tablet Take 1 tablet (20 mg total) by mouth daily.   Glucosamine-Chondroit-Vit C-Mn (GLUCOSAMINE CHONDR 1500 COMPLX) CAPS Take 1 capsule by mouth 2 (two) times daily.   isosorbide  mononitrate (IMDUR ) 30 MG 24 hr tablet Take 1 tablet (30 mg total) by mouth daily.   metoprolol   tartrate (LOPRESSOR ) 50 MG tablet Take 1 tablet (50 mg total) by mouth 2 (two) times daily.   Multiple Vitamins-Minerals (CENTRUM SILVER 50+WOMEN) TABS Take 1 tablet by mouth daily.   nitroGLYCERIN  (NITROSTAT ) 0.4 MG SL tablet Place 1 tablet (0.4 mg total) under the tongue every 5 (five) minutes as needed for chest pain.   Facility-Administered Encounter Medications as of 05/12/2024  Medication   ipratropium (ATROVENT ) 0.03 % nasal spray 2 spray    Allergies (verified) Osteoporosis support [a-g pro] and Alendronate  sodium   History: Past Medical History:  Diagnosis Date   Acid reflux    Coronary artery disease involving native coronary artery of native heart with angina pectoris (HCC) 06/13/2018   Dyspnea    Environmental allergies    Hyperlipidemia 2014   Left patella fracture 2005   Osteoporosis 08/02/2021   DEXA 07/2021   S/P CABG x 4 06/14/2018   LIMA to LAD, SVG to OM, Sequential SVG to RCA and PDA, EVH from bilateral thighs and RLL   Past Surgical History:  Procedure Laterality Date   CARDIAC CATHETERIZATION  06/13/2018   CORONARY ARTERY BYPASS GRAFT N/A 06/14/2018   Procedure: CORONARY ARTERY BYPASS GRAFTING (CABG) times 4 using left internal mammary artery and right saphenous vein using endoscope.;  Surgeon: Dusty Sudie DEL, MD;  Location: [facility] OR;  Service: Open Heart Surgery;  Laterality: N/A;   DILATION AND CURETTAGE, DIAGNOSTIC / THERAPEUTIC     for nonmalignant polyp   RIGHT/LEFT HEART CATH AND CORONARY ANGIOGRAPHY N/A 06/13/2018   Procedure: RIGHT/LEFT HEART CATH AND CORONARY ANGIOGRAPHY;  Surgeon: Mady Bruckner, MD;  Location: [facility] INVASIVE CV LAB;  Service: Cardiovascular;  Laterality: N/A;   TEE WITHOUT CARDIOVERSION N/A 06/14/2018   Procedure: TRANSESOPHAGEAL ECHOCARDIOGRAM (TEE);  Surgeon: Dusty Sudie DEL, MD;  Location: [facility] OR;  Service: Open Heart Surgery;  Laterality: N/A;   Family History  Problem Relation Age of Onset   Pancreatic cancer Mother    Heart  attack Father        age 43, had 3 heart attacks from blood clots    Other Father        clotting disorder   Colon cancer Sister    Breast cancer Paternal Aunt    Social History   Socioeconomic History   Marital status: Married    Spouse name: Not on file   Number of children: Not on file   Years of education: high school graduate   Highest education level: Some college, no degree  Occupational History   Occupation: retired  Tobacco Use   Smoking status: Never   Smokeless tobacco: Never  Vaping Use   Vaping status: Never Used  Substance and Sexual Activity   Alcohol use: Yes    Comment: wine and liquor, about 1 drink per day   Drug use: Never   Sexual activity: Not Currently  Other Topics Concern   Not on file  Social History Narrative   Lives with husband Reggie and dog named Hachi.  Religious or personal believes: Sherlean   Has an advance directive, would want her husband to make medical decisions for her if she were unable to do so.   Does not exercise regularly.  Does walk sometimes.   For fun she likes to eat out, travel, spent time with family.   Social Drivers of Corporate investment banker Strain: Low Risk  (05/12/2024)   Overall Financial Resource Strain (CARDIA)    Difficulty of Paying Living Expenses: Not hard at all  Food Insecurity: No Food Insecurity (05/12/2024)   Hunger Vital Sign    Worried About Running Out of Food in the Last Year: Never true    Ran Out of Food in the Last Year: Never true  Transportation Needs: No Transportation Needs (05/12/2024)   PRAPARE - Administrator, Civil Service (Medical): No    Lack of Transportation (Non-Medical): No  Physical Activity: Sufficiently Active (05/12/2024)   Exercise Vital Sign    Days of Exercise per Week: 5 days    Minutes of Exercise per Session: 30 min  Stress: No Stress Concern Present (05/12/2024)   [employer] of Occupational Health - Occupational Stress Questionnaire    Feeling  of Stress: Only a little  Social Connections: Moderately Isolated (05/12/2024)   Social Connection and Isolation Panel    Frequency of Communication with Friends and Family: More than three times a week    Frequency of Social Gatherings with Friends and Family: Twice a week    Attends Religious Services: Patient declined    Database administrator or Organizations: No    Attends Engineer, structural: Never    Marital Status: Married    Tobacco Counseling Counseling given: Not Answered    Clinical Intake:  Pre-visit preparation completed: Yes  Pain : No/denies pain Pain Score: 0-No pain     BMI - recorded: 29.63 Nutritional Status: BMI 25 -29 Overweight Nutritional Risks: None Diabetes: No  Lab Results  Component Value Date   HGBA1C 6.1 (H) 06/13/2018     How often do you need to have someone help you when you read instructions, pamphlets, or other written materials from your doctor or pharmacy?: 1 - Never What is the last grade level you completed in school?: SOME COLLEGE  Interpreter Needed?: No  Information entered by :: Fletcher Ostermiller N. Yasheka Fossett, LPN.   Activities of Daily Living     05/12/2024   11:39 AM 05/08/2024    7:21 PM  In your present state of health, do you have any difficulty performing the following activities:  Hearing? 0 0  Vision? 0 0  Difficulty concentrating or making decisions? 0 0  Comment BSE: GAMES, AVID READER, PUZZLES BSE: GAMES, AVID READER, PUZZLES  Walking or climbing stairs? 1 1  Dressing or bathing? 0 0  Doing errands, shopping? 0 0  Preparing Food and eating ? N N  Using the Toilet? N N  In the past six months, have you accidently leaked urine? N N  Do you have problems with loss of bowel control? N N  Managing your Medications? N N  Managing your Finances? N N  Housekeeping or managing your Housekeeping? N N    Patient Care Team: Everhart, Kirstie, DO as PCP - General (Family Medicine) Acharya, Gayatri A, MD as PCP -  Cardiology (Cardiology) [facility] as Consulting Physician (Optometry)  I have updated your Care Teams any recent Medical Services you may have received from other  providers in the past year.     Assessment:   This is a routine wellness examination for [name].  Hearing/Vision screen Hearing Screening - Comments:: Denies hearing difficulties.  Vision Screening - Comments:: Wears reading glasses, cataracts removed - up to date with routine eye exams with [facility]    Goals Addressed             This Visit's Progress    05/12/2024: To adjust my liver and cholesterol numbers.         Depression Screen     05/12/2024   11:32 AM 04/25/2023   10:20 AM 02/20/2023    8:39 AM 08/30/2022    8:31 AM 07/18/2022    1:37 PM 06/12/2022    4:08 PM 05/09/2018   10:19 AM  PHQ 2/9 Scores  PHQ - 2 Score 0 0 0 0 0 0 0  PHQ- 9 Score 4 6 0  4 4     Fall Risk     05/12/2024   11:39 AM 05/08/2024    7:21 PM 04/25/2023   10:19 AM 02/20/2023    8:35 AM 08/30/2022    8:31 AM  Fall Risk   Falls in the past year? 0 0 0 0 0  Number falls in past yr: 0 0 0 0   Injury with Fall? 0 0 0 0   Risk for fall due to : No Fall Risks      Follow up Falls evaluation completed   Falls evaluation completed;Education provided;Falls prevention discussed     MEDICARE RISK AT HOME:  Medicare Risk at Home Any stairs in or around the home?: Yes If so, are there any without handrails?: No Home free of loose throw rugs in walkways, pet beds, electrical cords, etc?: Yes Adequate lighting in your home to reduce risk of falls?: Yes Life alert?: No Use of a cane, walker or w/c?: Yes Grab bars in the bathroom?: No Shower chair or bench in shower?: No Elevated toilet seat or a handicapped toilet?: No  TIMED UP AND GO:  Was the test performed?  No  Cognitive Function: Declined/Normal: No cognitive concerns noted by patient or family. Patient alert, oriented, able to answer  questions appropriately and recall recent events. No signs of memory loss or confusion.    05/12/2024   11:32 AM  MMSE - Mini Mental State Exam  Not completed: Unable to complete        05/12/2024   11:35 AM 02/20/2023    8:37 AM  6CIT Screen  What Year? 0 points 0 points  What month? 0 points 0 points  What time? 0 points 0 points  Count back from 20 0 points 0 points  Months in reverse 0 points 0 points  Repeat phrase 0 points 0 points  Total Score 0 points 0 points    Immunizations Immunization History  Administered Date(s) Administered   Pneumococcal Polysaccharide-23 03/16/2012, 01/28/2021   Tdap 03/16/2016   Zoster, Live 03/16/2012    Screening Tests Health Maintenance  Topic Date Due   COVID-19 Vaccine (1) Never done   Zoster Vaccines- Shingrix (1 of 2) 08/16/1962   Pneumococcal Vaccine: 50+ Years (2 of 2 - PCV) 01/28/2022   Influenza Vaccine  Never done   Medicare Annual Wellness (AWV)  05/12/2025   DTaP/Tdap/Td (2 - Td or Tdap) 03/16/2026   DEXA SCAN  Completed   HPV VACCINES  Aged Out   Meningococcal B Vaccine  Aged Out   Hepatitis C Screening  Discontinued    Health Maintenance Items Addressed: Yes Patient aware of current care gaps.  Patient declined Flu and Covid.  Additional Screening:  Vision Screening: Recommended annual ophthalmology exams for early detection of glaucoma and other disorders of the eye. Is the patient up to date with their annual eye exam?  Yes  Who is the provider or what is the name of the office in which the patient attends annual eye exams? [facility]  Dental Screening: Recommended annual dental exams for proper oral hygiene  Community Resource Referral / Chronic Care Management: CRR required this visit?  No   CCM required this visit?  No   Plan:    I have personally reviewed and noted the following in the patient's chart:   Medical and social history Use of alcohol, tobacco or illicit drugs  Current  medications and supplements including opioid prescriptions. Patient is not currently taking opioid prescriptions. Functional ability and status Nutritional status Physical activity Advanced directives List of other physicians Hospitalizations, surgeries, and ER visits in previous 12 months Vitals Screenings to include cognitive, depression, and falls Referrals and appointments  In addition, I have reviewed and discussed with patient certain preventive protocols, quality metrics, and best practice recommendations. A written personalized care plan for preventive services as well as general preventive health recommendations were provided to patient.   Roz LOISE Fuller, LPN   [id number]   After Visit Summary: (MyChart) Due to this being a telephonic visit, the after visit summary with patients personalized plan was offered to patient via MyChart   Notes: Nothing significant to report at this time.

## 2024-05-12 NOTE — Patient Instructions (Signed)
 [name],  Thank you for taking the time for your Medicare Wellness Visit. I appreciate your continued commitment to your health goals. Please review the care plan we discussed, and feel free to reach out if I can assist you further.  Medicare recommends these wellness visits once per year to help you and your care team stay ahead of potential health issues. These visits are designed to focus on prevention, allowing your provider to concentrate on managing your acute and chronic conditions during your regular appointments.  Please note that Annual Wellness Visits do not include a physical exam. Some assessments may be limited, especially if the visit was conducted virtually. If needed, we may recommend a separate in-person follow-up with your provider.  Ongoing Care Seeing your primary care provider every 3 to 6 months helps us  monitor your health and provide consistent, personalized care.   Referrals If a referral was made during today's visit and you haven't received any updates within two weeks, please contact the referred provider directly to check on the status.  Recommended Screenings:  Health Maintenance  Topic Date Due   COVID-19 Vaccine (1) Never done   Zoster (Shingles) Vaccine (1 of 2) 08/16/1962   Pneumococcal Vaccine for age over 47 (2 of 2 - PCV) 01/28/2022   Flu Shot  Never done   Medicare Annual Wellness Visit  05/12/2025   DTaP/Tdap/Td vaccine (2 - Td or Tdap) 03/16/2026   DEXA scan (bone density measurement)  Completed   HPV Vaccine  Aged Out   Meningitis B Vaccine  Aged Out   Hepatitis C Screening  Discontinued       05/12/2024   11:30 AM  Advanced Directives  Does Patient Have a Medical Advance Directive? Yes  Type of Estate agent of [employer];Living will  Copy of Healthcare Power of Attorney in Chart? No - copy requested   Advance Care Planning is important because it: Ensures you receive medical care that aligns with your values, goals,  and preferences. Provides guidance to your family and loved ones, reducing the emotional burden of decision-making during critical moments.  Vision: Annual vision screenings are recommended for early detection of glaucoma, cataracts, and diabetic retinopathy. These exams can also reveal signs of chronic conditions such as diabetes and high blood pressure.  Dental: Annual dental screenings help detect early signs of oral cancer, gum disease, and other conditions linked to overall health, including heart disease and diabetes.  Please see the attached documents for additional preventive care recommendations.

## 2024-05-23 ENCOUNTER — Encounter: Payer: Self-pay | Admitting: Family Medicine
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
## 2024-06-16 ENCOUNTER — Ambulatory Visit: Admitting: Family Medicine

## 2024-06-16 ENCOUNTER — Ambulatory Visit: Payer: Self-pay | Admitting: Family Medicine

## 2024-06-16 ENCOUNTER — Encounter: Payer: Self-pay | Admitting: Family Medicine

## 2024-06-16 VITALS — BP 121/75 | HR 65 | Ht 62.0 in | Wt 165.5 lb

## 2024-06-16 DIAGNOSIS — Z0000 Encounter for general adult medical examination without abnormal findings: Secondary | ICD-10-CM | POA: Diagnosis not present

## 2024-06-16 DIAGNOSIS — R7303 Prediabetes: Secondary | ICD-10-CM

## 2024-06-16 LAB — POCT GLYCOSYLATED HEMOGLOBIN (HGB A1C): HbA1c, POC (prediabetic range): 7.5 % — AB (ref 5.7–6.4)

## 2024-06-16 NOTE — Patient Instructions (Signed)
 Good to see you today - Thank you for coming in  Things we discussed today:  -You need to be taking 1200 mg of calcium  and 800 IU of vitamin D  daily for your bone health.  Is also very important to continue strength exercising and walking.  Try your best to avoid falls. -I will let you know your A1c is today. - We will give you your pneumonia shot today 2.  Please always bring your medication bottles  Come back to see me in 1 year

## 2024-07-16 ENCOUNTER — Encounter: Payer: Self-pay | Admitting: Pharmacist

## 2024-07-21 ENCOUNTER — Other Ambulatory Visit: Payer: Self-pay

## 2024-07-21 ENCOUNTER — Encounter: Payer: Self-pay | Admitting: Internal Medicine

## 2024-07-21 MED ORDER — NITROGLYCERIN 0.4 MG SL SUBL: 0.4000 mg | SUBLINGUAL_TABLET | SUBLINGUAL | 3 refills | Status: AC | PRN

## 2025-05-14 ENCOUNTER — Encounter
